# Patient Record
Sex: Male | Born: 1985
Health system: Southern US, Community
[De-identification: ages and names within clinical notes are randomized; demographics above are authoritative.]

## PROBLEM LIST (undated history)

## (undated) DIAGNOSIS — I10 Essential (primary) hypertension: Secondary | ICD-10-CM

## (undated) DIAGNOSIS — T7840XA Allergy, unspecified, initial encounter: Secondary | ICD-10-CM

## (undated) DIAGNOSIS — IMO0002 Reserved for concepts with insufficient information to code with codable children: Secondary | ICD-10-CM

## (undated) DIAGNOSIS — F419 Anxiety disorder, unspecified: Secondary | ICD-10-CM

## (undated) DIAGNOSIS — F1010 Alcohol abuse, uncomplicated: Secondary | ICD-10-CM

## (undated) DIAGNOSIS — M549 Dorsalgia, unspecified: Secondary | ICD-10-CM

## (undated) DIAGNOSIS — K5790 Diverticulosis of intestine, part unspecified, without perforation or abscess without bleeding: Secondary | ICD-10-CM

## (undated) DIAGNOSIS — N200 Calculus of kidney: Secondary | ICD-10-CM

## (undated) HISTORY — DX: Alcohol abuse, uncomplicated: F10.10

## (undated) HISTORY — DX: Diverticulosis of intestine, part unspecified, without perforation or abscess without bleeding: K57.90

## (undated) HISTORY — DX: Calculus of kidney: N20.0

## (undated) HISTORY — DX: Anxiety disorder, unspecified: F41.9

## (undated) HISTORY — DX: Reserved for concepts with insufficient information to code with codable children: IMO0002

## (undated) HISTORY — DX: Allergy, unspecified, initial encounter: T78.40XA

## (undated) HISTORY — DX: Dorsalgia, unspecified: M54.9

## (undated) HISTORY — PX: APPENDECTOMY: SHX54

## (undated) HISTORY — DX: Essential (primary) hypertension: I10

---
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
## 2004-11-23 ENCOUNTER — Ambulatory Visit: Payer: Self-pay | Admitting: Family Medicine

## 2005-11-02 ENCOUNTER — Ambulatory Visit: Payer: Self-pay | Admitting: Family Medicine

## 2005-11-16 ENCOUNTER — Ambulatory Visit: Payer: Self-pay | Admitting: Family Medicine

## 2006-02-01 ENCOUNTER — Ambulatory Visit: Payer: Self-pay | Admitting: Family Medicine

## 2006-02-09 ENCOUNTER — Ambulatory Visit: Payer: Self-pay | Admitting: Family Medicine

## 2006-02-22 ENCOUNTER — Ambulatory Visit: Payer: Self-pay | Admitting: Family Medicine

## 2006-04-25 ENCOUNTER — Ambulatory Visit: Payer: Self-pay | Admitting: Family Medicine

## 2006-05-01 ENCOUNTER — Ambulatory Visit: Payer: Self-pay | Admitting: Family Medicine

## 2006-05-09 ENCOUNTER — Ambulatory Visit: Payer: Self-pay | Admitting: Family Medicine

## 2006-05-12 ENCOUNTER — Ambulatory Visit: Payer: Self-pay | Admitting: Family Medicine

## 2006-05-15 ENCOUNTER — Ambulatory Visit: Payer: Self-pay | Admitting: Family Medicine

## 2006-05-18 ENCOUNTER — Ambulatory Visit: Payer: Self-pay | Admitting: Family Medicine

## 2006-05-23 ENCOUNTER — Ambulatory Visit: Payer: Self-pay | Admitting: Family Medicine

## 2006-05-23 ENCOUNTER — Encounter: Admission: RE | Admit: 2006-05-23 | Discharge: 2006-05-23 | Payer: Self-pay | Admitting: Family Medicine

## 2006-06-06 ENCOUNTER — Ambulatory Visit: Payer: Self-pay | Admitting: Family Medicine

## 2006-06-13 ENCOUNTER — Inpatient Hospital Stay (HOSPITAL_COMMUNITY): Admission: AD | Admit: 2006-06-13 | Discharge: 2006-06-15 | Payer: Self-pay | Admitting: Family Medicine

## 2006-06-13 ENCOUNTER — Ambulatory Visit: Payer: Self-pay | Admitting: Family Medicine

## 2006-06-14 ENCOUNTER — Ambulatory Visit: Payer: Self-pay | Admitting: Internal Medicine

## 2006-06-16 ENCOUNTER — Emergency Department (HOSPITAL_COMMUNITY): Admission: EM | Admit: 2006-06-16 | Discharge: 2006-06-16 | Payer: Self-pay | Admitting: Emergency Medicine

## 2006-06-20 ENCOUNTER — Ambulatory Visit: Payer: Self-pay | Admitting: Gastroenterology

## 2006-07-07 ENCOUNTER — Ambulatory Visit: Payer: Self-pay | Admitting: Family Medicine

## 2006-08-11 ENCOUNTER — Ambulatory Visit: Payer: Self-pay | Admitting: Family Medicine

## 2006-09-19 DIAGNOSIS — J309 Allergic rhinitis, unspecified: Secondary | ICD-10-CM | POA: Insufficient documentation

## 2006-09-19 DIAGNOSIS — I10 Essential (primary) hypertension: Secondary | ICD-10-CM | POA: Insufficient documentation

## 2006-09-19 DIAGNOSIS — J45909 Unspecified asthma, uncomplicated: Secondary | ICD-10-CM | POA: Insufficient documentation

## 2006-09-22 ENCOUNTER — Ambulatory Visit: Payer: Self-pay | Admitting: Family Medicine

## 2006-10-05 ENCOUNTER — Ambulatory Visit: Payer: Self-pay | Admitting: Family Medicine

## 2006-11-06 ENCOUNTER — Ambulatory Visit: Payer: Self-pay | Admitting: Family Medicine

## 2006-11-06 ENCOUNTER — Emergency Department (HOSPITAL_COMMUNITY): Admission: EM | Admit: 2006-11-06 | Discharge: 2006-11-06 | Payer: Self-pay | Admitting: Emergency Medicine

## 2006-11-06 DIAGNOSIS — R1031 Right lower quadrant pain: Secondary | ICD-10-CM | POA: Insufficient documentation

## 2006-11-10 ENCOUNTER — Ambulatory Visit: Payer: Self-pay | Admitting: Family Medicine

## 2007-02-15 ENCOUNTER — Ambulatory Visit: Payer: Self-pay | Admitting: Family Medicine

## 2007-04-16 ENCOUNTER — Ambulatory Visit: Payer: Self-pay | Admitting: Internal Medicine

## 2007-04-16 DIAGNOSIS — J069 Acute upper respiratory infection, unspecified: Secondary | ICD-10-CM | POA: Insufficient documentation

## 2007-04-19 ENCOUNTER — Telehealth: Payer: Self-pay | Admitting: Internal Medicine

## 2007-06-14 ENCOUNTER — Ambulatory Visit: Payer: Self-pay | Admitting: Family Medicine

## 2007-06-15 ENCOUNTER — Ambulatory Visit: Payer: Self-pay | Admitting: Family Medicine

## 2007-06-18 ENCOUNTER — Ambulatory Visit: Payer: Self-pay | Admitting: Family Medicine

## 2007-06-22 ENCOUNTER — Ambulatory Visit: Payer: Self-pay | Admitting: Family Medicine

## 2007-07-05 ENCOUNTER — Telehealth: Payer: Self-pay | Admitting: Family Medicine

## 2007-07-12 ENCOUNTER — Telehealth (INDEPENDENT_AMBULATORY_CARE_PROVIDER_SITE_OTHER): Payer: Self-pay | Admitting: *Deleted

## 2007-07-13 ENCOUNTER — Ambulatory Visit: Payer: Self-pay | Admitting: Family Medicine

## 2007-07-16 ENCOUNTER — Telehealth: Payer: Self-pay | Admitting: Family Medicine

## 2007-07-19 ENCOUNTER — Encounter: Admission: RE | Admit: 2007-07-19 | Discharge: 2007-07-19 | Payer: Self-pay | Admitting: Family Medicine

## 2007-07-23 ENCOUNTER — Ambulatory Visit: Payer: Self-pay | Admitting: Family Medicine

## 2007-07-26 ENCOUNTER — Telehealth (INDEPENDENT_AMBULATORY_CARE_PROVIDER_SITE_OTHER): Payer: Self-pay | Admitting: *Deleted

## 2007-07-31 ENCOUNTER — Telehealth: Payer: Self-pay | Admitting: Family Medicine

## 2007-08-14 ENCOUNTER — Telehealth: Payer: Self-pay | Admitting: Family Medicine

## 2007-08-15 ENCOUNTER — Telehealth: Payer: Self-pay | Admitting: Family Medicine

## 2007-12-29 ENCOUNTER — Ambulatory Visit: Payer: Self-pay | Admitting: Family Medicine

## 2008-10-30 ENCOUNTER — Ambulatory Visit: Payer: Self-pay | Admitting: Family Medicine

## 2008-12-25 ENCOUNTER — Ambulatory Visit: Payer: Self-pay | Admitting: Family Medicine

## 2009-06-01 ENCOUNTER — Encounter (INDEPENDENT_AMBULATORY_CARE_PROVIDER_SITE_OTHER): Payer: Self-pay | Admitting: General Surgery

## 2009-06-01 ENCOUNTER — Observation Stay (HOSPITAL_COMMUNITY): Admission: EM | Admit: 2009-06-01 | Discharge: 2009-06-02 | Payer: Self-pay | Admitting: Emergency Medicine

## 2009-06-01 ENCOUNTER — Ambulatory Visit: Payer: Self-pay | Admitting: Family Medicine

## 2009-07-06 ENCOUNTER — Ambulatory Visit: Payer: Self-pay | Admitting: Family Medicine

## 2009-08-18 ENCOUNTER — Ambulatory Visit: Payer: Self-pay | Admitting: Family Medicine

## 2009-08-20 ENCOUNTER — Ambulatory Visit: Payer: Self-pay | Admitting: Family Medicine

## 2009-08-20 DIAGNOSIS — F410 Panic disorder [episodic paroxysmal anxiety] without agoraphobia: Secondary | ICD-10-CM | POA: Insufficient documentation

## 2009-08-21 ENCOUNTER — Ambulatory Visit: Payer: Self-pay | Admitting: Family Medicine

## 2009-09-09 ENCOUNTER — Ambulatory Visit: Payer: Self-pay | Admitting: Family Medicine

## 2009-09-09 DIAGNOSIS — B360 Pityriasis versicolor: Secondary | ICD-10-CM | POA: Insufficient documentation

## 2009-09-09 LAB — CONVERTED CEMR LAB
Cholesterol, target level: 200 mg/dL
HDL goal, serum: 40 mg/dL
LDL Goal: 130 mg/dL

## 2009-12-02 ENCOUNTER — Telehealth: Payer: Self-pay | Admitting: Family Medicine

## 2009-12-03 ENCOUNTER — Telehealth: Payer: Self-pay | Admitting: Family Medicine

## 2009-12-14 ENCOUNTER — Ambulatory Visit: Payer: Self-pay | Admitting: Family Medicine

## 2009-12-14 LAB — CONVERTED CEMR LAB
ALT: 34 U/L (ref 0–53)
AST: 30 U/L (ref 0–37)
Albumin: 4.4 g/dL (ref 3.5–5.2)
Alkaline Phosphatase: 101 U/L (ref 39–117)
BUN: 17 mg/dL (ref 6–23)
Basophils Absolute: 0.1 K/uL (ref 0.0–0.1)
Basophils Relative: 0.9 % (ref 0.0–3.0)
Bilirubin Urine: NEGATIVE
Bilirubin, Direct: 0.1 mg/dL (ref 0.0–0.3)
CO2: 22 meq/L (ref 19–32)
Calcium: 9.2 mg/dL (ref 8.4–10.5)
Chloride: 104 meq/L (ref 96–112)
Cholesterol: 204 mg/dL — ABNORMAL HIGH (ref 0–200)
Creatinine, Ser: 0.9 mg/dL (ref 0.4–1.5)
Direct LDL: 122.5 mg/dL
Eosinophils Absolute: 0 K/uL (ref 0.0–0.7)
Eosinophils Relative: 0 % (ref 0.0–5.0)
GFR calc non Af Amer: 113.96 mL/min (ref >60)
Glucose, Bld: 82 mg/dL (ref 70–99)
HCT: 44.2 % (ref 39.0–52.0)
HDL: 35.8 mg/dL — ABNORMAL LOW (ref >39.00)
Hemoglobin, Urine: NEGATIVE
Hemoglobin: 15.5 g/dL (ref 13.0–17.0)
Ketones, ur: NEGATIVE mg/dL
Leukocytes, UA: NEGATIVE
Lymphocytes Relative: 31.1 % (ref 12.0–46.0)
Lymphs Abs: 2.3 K/uL (ref 0.7–4.0)
MCHC: 35.1 g/dL (ref 30.0–36.0)
MCV: 93.3 fL (ref 78.0–100.0)
Monocytes Absolute: 0.6 K/uL (ref 0.1–1.0)
Monocytes Relative: 8.7 % (ref 3.0–12.0)
Neutro Abs: 4.4 K/uL (ref 1.4–7.7)
Neutrophils Relative %: 59.3 % (ref 43.0–77.0)
Nitrite: NEGATIVE
Platelets: 260 K/uL (ref 150.0–400.0)
Potassium: 4.5 meq/L (ref 3.5–5.1)
RBC: 4.74 M/uL (ref 4.22–5.81)
RDW: 12.9 % (ref 11.5–14.6)
Sodium: 136 meq/L (ref 135–145)
Specific Gravity, Urine: 1.025 (ref 1.000–1.030)
TSH: 0.77 u[IU]/mL (ref 0.35–5.50)
Total Bilirubin: 0.5 mg/dL (ref 0.3–1.2)
Total CHOL/HDL Ratio: 6
Total Protein, Urine: NEGATIVE mg/dL
Total Protein: 7.2 g/dL (ref 6.0–8.3)
Triglycerides: 268 mg/dL — ABNORMAL HIGH (ref 0.0–149.0)
Urine Glucose: NEGATIVE mg/dL
Urobilinogen, UA: 0.2 (ref 0.0–1.0)
VLDL: 53.6 mg/dL — ABNORMAL HIGH (ref 0.0–40.0)
WBC: 7.4 10*3/microliter (ref 4.5–10.5)
pH: 6 (ref 5.0–8.0)

## 2009-12-21 ENCOUNTER — Ambulatory Visit: Payer: Self-pay | Admitting: Family Medicine

## 2010-01-18 ENCOUNTER — Ambulatory Visit: Payer: Self-pay | Admitting: Family Medicine
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
## 2010-04-27 NOTE — Assessment & Plan Note (Signed)
Summary: RASH ON BOTH ARMS/CJR   Vital Signs:  Patient profile:   25 year old male Height:      72 inches Weight:      212 pounds BMI:     28.86 Temp:     98.0 degrees F oral Pulse rate:   118 / minute BP sitting:   160 / 80  (left arm) Cuff size:   regular  Vitals Entered By: Kern Reap CMA Duncan Dull) (Aug 18, 2009 3:36 PM) CC: rash on arms   CC:  rash on arms.  History of Present Illness: Ryan [name] is a 25 year old male, who comes in today for evaluation of a skin rash.  He had a chronic rash on both upper arms.  It comes and goes.  Worse in the winter  Allergies: 1)  ! Penicillin  Review of Systems      See HPI  Physical Exam  General:  Well-developed,well-nourished,in no acute distress; alert,appropriate and cooperative throughout examination Skin:  the skin rash, consistent with picullaris    Problems:  Medical Problems Added: 1)  Dx of Rash-nonvesicular  (ICD-782.1)  Impression & Recommendations:  Problem # 1:  RASH-NONVESICULAR (ICD-782.1) Assessment New  His updated medication list for this problem includes:    Triamcinolone Acetonide 0.5 % Oint (Triamcinolone acetonide) .Marland Kitchen... Apply at bedtime ,,,,small amts !!!!!!!  Orders: Prescription Created Electronically [phone])  Complete Medication List: 1)  Cozaar 50 Mg Tabs (Losartan potassium) .... Take 1 tablet by mouth every morning 2)  Prednisone 20 Mg Tabs (Prednisone) .... Uad 3)  Triamcinolone Acetonide 0.5 % Oint (Triamcinolone acetonide) .... Apply at bedtime ,,,,small amts !!!!!!!  Patient Instructions: 1)  apply udder crm/and small amounts of triamcinolone ointment nightly p.r.n. Prescriptions: TRIAMCINOLONE ACETONIDE 0.5 % OINT (TRIAMCINOLONE ACETONIDE) apply at bedtime ,,,,small amts !!!!!!!  #60 gr x 3   Entered and Authorized by:   Roderick Pee MD   Signed by:   Roderick Pee MD on 08/18/2009   Method used:   [facility] to        [facility] [address] [phone]* (retail)       [address]       [place], [place]  [address]       Ph: [phone] or [phone]       Fax: [phone]   RxID:   [id number]

## 2010-04-27 NOTE — Assessment & Plan Note (Signed)
Summary: flu shot/jls  Nurse Visit  Flu Vaccine Consent Questions     Do you have a history of severe allergic reactions to this vaccine? no    Any prior history of allergic reactions to egg and/or gelatin? no    Do you have a sensitivity to the preservative Thimersol? no    Do you have a past history of Guillan-Barre Syndrome? no    Do you currently have an acute febrile illness? no    Have you ever had a severe reaction to latex? no    Vaccine information given and explained to patient? yes    Are you currently pregnant? no    Lot Number:[id number]   Site Given  Left Deltoid IM   Prior Medications: BENICAR 40 MG  TABS (OLMESARTAN MEDOXOMIL) Take 1 tablet by mouth once a day FLEXERIL 10 MG  TABS (CYCLOBENZAPRINE HCL) Take 1 tablet by mouth three times a day VICODIN ES 7.5-750 MG  TABS (HYDROCODONE-ACETAMINOPHEN) 1 or 2 tabs three times a day as needed pain HYCODAN 5-1.5 MG/5ML  SYRP (HYDROCODONE-HOMATROPINE) 1 or 2 tsps three times a day as needed Current Allergies: ! PENICILLIN    Orders Added: 1)  Admin 1st Vaccine [90471] 2)  Flu Vaccine 75yrs + [place].Dew    ]

## 2010-04-27 NOTE — Assessment & Plan Note (Signed)
Summary: FLU SHOT/CJR  Nurse Visit  Flu Vaccine Consent Questions     Do you have a history of severe allergic reactions to this vaccine? no    Any prior history of allergic reactions to egg and/or gelatin? no    Do you have a sensitivity to the preservative Thimersol? no    Do you have a past history of Guillan-Barre Syndrome? no    Do you currently have an acute febrile illness? no    Have you ever had a severe reaction to latex? no    Vaccine information given and explained to patient? yes    Are you currently pregnant? no    Lot Number:[id number]   Exp Date:09/24/2009   Site Given  Left Deltoid IM Romualdo Bolk, CMA (AAMA)  December 25, 2008 3:10 PM  Allergies: 1)  ! Penicillin  Orders Added: 1)  Admin 1st Vaccine [90471] 2)  Flu Vaccine 15yrs + [28413]

## 2010-04-27 NOTE — Progress Notes (Signed)
Summary: refill  Phone Note Call from Patient Call back at Home Phone [phone]   Caller: pt live Call For: Tawanna Cooler Summary of Call: vicodan 7.5 mg [facility] please call patient when you call in to drug store .  Is this okay to fill? Initial call taken by: Roselle Locus,  Aug 14, 2007 12:36 PM

## 2010-04-27 NOTE — Progress Notes (Signed)
Summary: problem with med, changed to Vicodin  Phone Note Call from Patient Call back at [phone]   Caller: patient live Call For: todd Summary of Call: patient is taking percaset it is making him dizzy and can't sleep.   Please call something else in.  [facility] Initial call taken by: Celine Ahr,  July 16, 2007 3:08 PM  Follow-up for Phone Call        please call N. Vicodin ES number 50 directions one tablet every 4 to 6 hours p.r.n. for severe pain, [facility]ills x [facility] Follow-up by: Roderick Pee MD,  July 17, 2007 8:47 AM  Additional Follow-up for Phone Call Additional follow up Details #1::        Rx called in, pt informed.  Pt MRI of his back is scheduled for Thursday 2/23.  Scheduled pt for F/U with Dr Tawanna Cooler on Monday 4/27. Pt informed of new no-show policy. Additional Follow-up by: Sid Falcon LPN,  July 17, 2007 9:33 AM

## 2010-04-27 NOTE — Progress Notes (Signed)
Summary: about med  Phone Note Call from Patient Call back at [phone]   Caller: patient mother Call For: todd Summary of Call: Aneta Mins did get a refill on 5-5 for hydrocodone pt is out of med.  I would like you to call  me to talk about this problem. Initial call taken by: Celine Ahr,  Aug 15, 2007 11:23 AM  Follow-up for Phone Call        discussed  w MOM She is concerned he is abusing  Vicodin.  Therefore, we will give him 21 tablets to be picked up by the mother at the pharmacy.  He can take one at bedtime as needed for pain.  No more.  He is to see the folks at the pain clinic in two weeks and begin that process. Follow-up by: Roderick Pee MD,  Aug 15, 2007 11:56 AM

## 2010-04-27 NOTE — Progress Notes (Signed)
Summary: rx refill  Phone Note Call from Patient Call back at [phone]   Caller: patient live Call For: todd Summary of Call: Patient needs rx refill for vicaden  7.5mg  2 3 times aday.  [facility].  Please call  Initial call taken by: Celine Ahr,  July 12, 2007 2:33 PM  Follow-up for Phone Call        t 30 tablets no refills.  Patient needs an office visit tomorrow forevaluation Follow-up by: Roderick Pee MD,  July 12, 2007 4:17 PM  Additional Follow-up for Phone Call Additional follow up Details #1::        left message on machine patient needs appt per Dr. Tawanna Cooler tomorrow scheduled appt for patient to be seen at 11:00 ask him to call if he can't make appt and prescription sent to pharmacy ..................................................................Marland KitchenDoristine Devoid  July 12, 2007 4:25 PM       Prescriptions: VICODIN ES 7.5-750 MG  TABS (HYDROCODONE-ACETAMINOPHEN) 1 or 2 tabs three times a day as needed pain  #30 x 0   Entered by:   Doristine Devoid   Authorized by:   Roderick Pee MD   Signed by:   Doristine Devoid on 07/12/2007   Method used:   Telephoned to ...       [facility] [address] [phone]*       [address]       [place], [place]  [address]       Ph: [phone] or [phone]       Fax: [phone]   RxID:   [id number]

## 2010-04-27 NOTE — Progress Notes (Signed)
Summary: REFILL REQUEST (PT HAS APPT ON 9/12)  Phone Note Refill Request   Refills Requested: Medication #1:  COZAAR 50 MG TABS Take 1 tablet by mouth every morning   [facility] [address] Pt would like enough of med sent in to do him till his appt  on 9/12.    Initial call taken by: Debbra Riding,  December 02, 2009 9:59 AM    Prescriptions: COZAAR 50 MG TABS (LOSARTAN POTASSIUM) Take 1 tablet by mouth every morning  #30 x 0   Entered by:   Kern Reap CMA (AAMA)   Authorized by:   Roderick Pee MD   Signed by:   Kern Reap CMA (AAMA) on 12/02/2009   Method used:   Electronically to        [facility]  [facility] [address] [phone]* (retail)       [address]       [place], [place]  [address]       Ph: [phone] or [phone]       Fax: [phone]   RxID:   [id number]

## 2010-04-27 NOTE — Assessment & Plan Note (Signed)
Summary: 3 MONTH ROA/JLS    History of Present Illness: still comes back today for follow-up of back pain.  He was seen on Monday of this week with a 12 hour history of severe right flank pain.  His pain at that juncture was on a scale of one to 10 and 9.  He was given hundred of Demerol IM and his mother took in the emergency room with a presumptive diagnosis of a right kidney stone.  CT scan year and lab work were all normal.  He was seen in consultation by general surgeon, the ninth in any acute surgical problems.  He went home and is doing okay.  Most of his pain is gone.  He still has some discomfort in his groin, which he describes as a sensation of a bruise.  He's had no new symptoms and meantime. there is a strong family history of kidney stones run in the family.  Acute Visit History:      He denies abdominal pain, chest pain, constipation, cough, diarrhea, earache, eye symptoms, fever, genitourinary symptoms, headache, musculoskeletal symptoms, nasal discharge, nausea, rash, sinus problems, sore throat, and vomiting.         Current Allergies: ! PENICILLIN     Review of Systems      See HPI   Physical Exam  General:     Well-developed,well-nourished,in no acute distress; alert,appropriate and cooperative throughout examination Abdomen:     Bowel sounds positive,abdomen soft and non-tender without masses, organomegaly or hernias noted.    Impression & Recommendations:  Problem # 1:  SYMPTOM, PAIN, ABDOMINAL, RIGHT LWR QUADRANT (ICD-789.03) Assessment: Improved  Complete Medication List: 1)  Benicar 40 Mg Tabs (Olmesartan medoxomil) .... Take 1 tablet by mouth once a day   Patient Instructions: 1)  your examination, today, is normal.  We also rechecked her urine.  It was clear.  Not sure why her having this ongoing problem.  If over the next couple days if symptoms stop them we would do nothing further.  If they recur, then I want you to see one of the urologists for  consultation
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
## 2010-04-27 NOTE — Progress Notes (Signed)
Summary: hydrocodone refill request  Phone Note Refill Request Message from:  Fax from Pharmacy on Jul 31, 2007 4:44 PM  Refills Requested: Medication #1:  VICODIN ES 7.5-750 MG  TABS 1 or 2 tabs three times a day as needed pain   Last Refilled: 07/25/2007  [facility][phone]  Initial call taken by: Doristine Devoid,  Jul 31, 2007 4:46 PM  Follow-up for Phone Call        is refill ok?......Marland KitchenDoristine Devoid  Jul 31, 2007 4:46 PM   Additional Follow-up for Phone Call Additional follow up Details #1::        okay to refill Vicodin ES number 50, 1 p.o. b.i.d., one refill Additional Follow-up by: Roderick Pee MD,  Aug 01, 2007 1:00 PM    Additional Follow-up for Phone Call Additional follow up Details #2::    please call pt when done at [phone] Follow-up by: Warnell Forester,  Aug 01, 2007 2:24 PM  Additional Follow-up for Phone Call Additional follow up Details #3:: Details for Additional Follow-up Action Taken: Rx called in, documented in EMR,  pt informed Additional Follow-up by: Sid Falcon LPN,  Jul 31, 1608 3:22 PM    Prescriptions: VICODIN ES 7.5-750 MG  TABS (HYDROCODONE-ACETAMINOPHEN) 1 or 2 tabs three times a day as needed pain  #50 x 1   Entered by:   Sid Falcon LPN   Authorized by:   Roderick Pee MD   Signed by:   Sid Falcon LPN on 96/06/5407   Method used:   Telephoned to ...       [facility]  [facility] [address] [phone]*       [address]       [place], [place]  [address]       Ph: [phone] or [phone]       Fax: [phone]   RxID:   [id number]

## 2010-04-27 NOTE — Progress Notes (Signed)
Summary: REFILL  Phone Note Call from Patient Call back at Home Phone [phone]   Caller: PATIENT TRIAGE MESSAGE Call For: k Summary of Call: wOULD LIKE REFILL ON THE COUGH MEDICINE HE GOT ON  MONDAY [facility] Initial call taken by: Roselle Locus,  April 19, 2007 2:18 PM  Follow-up for Phone Call        Generic hycodan  6 oz one or two twp evry 6 hours as needed per Dr.  Kirtland Bouchard  [facility]) ..................................................................Marland KitchenLynann Beaver [facility]  April 19, 2007 5:21 PM Follow-up by: Lynann Beaver CMA,  April 19, 2007 5:21 PM    New/Updated Medications: HYCODAN 5-1.5 MG/5ML  SYRP (HYDROCODONE-HOMATROPINE) one - two tsp q 6 hrs as needed for cough.   Prescriptions: HYCODAN 5-1.5 MG/5ML  SYRP (HYDROCODONE-HOMATROPINE) one - two tsp q 6 hrs as needed for cough.  #6 oz x 0   Entered by:   Lynann Beaver CMA   Authorized by:   Alita Chyle Triage Nurse   Signed by:   Lynann Beaver CMA on 04/19/2007   Method used:   Print then Give to Patient   RxID:   [id number]   authorized by Dr. Kirtland Bouchard and called to pharmacy.......[facility]) ..................................................................Marland KitchenLynann Beaver CMA  April 19, 2007 5:22 PM
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
## 2010-04-27 NOTE — Progress Notes (Signed)
Summary: pt called to update med info from your call   Phone Note Call from Patient Call back at Home Phone [phone]   Caller: pt live Call For: Tawanna Cooler Summary of Call: The Benicar was not refilled and the hydrocodone was an 8 day supply he waas taking 2 three times a day  Initial call taken by: Roselle Locus,  Aug 15, 2007 9:01 AM
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
## 2010-04-27 NOTE — Assessment & Plan Note (Signed)
Summary: med refill/cjr   Vital Signs:  Patient profile:   25 year old male Height:      72 inches Weight:      236 pounds BMI:     32.12 Temp:     97.5 degrees F oral BP sitting:   120 / 80  (left arm) Cuff size:   regular  Vitals Entered By: Kern Reap CMA Duncan Dull) (October 30, 2008 3:21 PM)  Reason for Visit follow up meds  History of Present Illness: Rhylee is a 25 year old male, who comes in today for evaluation of hypertension.  He spent on Benicar 40 mg daily.  BP 120/80.  He is intolerant of ACE inhibitors.  His insurance will no longer cover Benicar.  They want to pick an alternative drug.  Will switch to Cozaar 50 mg daily with careful blood pressure monitoring.  Over the next 4 weeks to be sure his blood pressure stays normal  Allergies: 1)  ! Penicillin  Social History: Reviewed history from 11/06/2006 and no changes required. Current Smoker Alcohol use-no Drug use-yes Regular exercise-no  Review of Systems      See HPI  Physical Exam  General:  Well-developed,well-nourished,in no acute distress; alert,appropriate and cooperative throughout examination   Impression & Recommendations:  Problem # 1:  HYPERTENSION (ICD-401.9) Assessment Improved  His updated medication list for this problem includes:    Benicar 40 Mg Tabs (Olmesartan medoxomil) .Marland Kitchen... Take 1 tablet by mouth once a day    Cozaar 50 Mg Tabs (Losartan potassium) .Marland Kitchen... Take 1 tablet by mouth every morning  Orders: Prescription Created Electronically [phone])  Complete Medication List: 1)  Benicar 40 Mg Tabs (Olmesartan medoxomil) .... Take 1 tablet by mouth once a day 2)  Cozaar 50 Mg Tabs (Losartan potassium) .... Take 1 tablet by mouth every morning  Patient Instructions: 1)  stopped the Benicar begin Cozaar 50 mg daily.  Check your blood pressure daily for the next 4 weeks to be sure her pressure stays normal.  If after 4 weeks.  Her blood pressure is normal then, just checking  weekly.  If her blood pressure does go up bring all the data and return to see me Prescriptions: COZAAR 50 MG TABS (LOSARTAN POTASSIUM) Take 1 tablet by mouth every morning  #100 x 3   Entered and Authorized by:   Roderick Pee MD   Signed by:   Roderick Pee MD on 10/30/2008   Method used:   [facility]        [facility] [address] [phone]* (retail)       [address]       [place], [place]  [address]       Ph: [phone] or [phone]       Fax: [phone]   RxID:   [id number]

## 2010-04-27 NOTE — Progress Notes (Signed)
Summary: pt called and said he would come tomorrow at 11   Phone Note Call from Patient   Caller: pt live Call For: Tawanna Cooler Summary of Call: pt called advised him you want him to come in tomorrow at 11:00.  Said he would  Initial call taken by: Roselle Locus,  July 12, 2007 4:32 PM  Follow-up for Phone Call        noted ..................................................................Marland KitchenDoristine Devoid  July 12, 2007 4:48 PM
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
## 2010-04-27 NOTE — Progress Notes (Signed)
Summary: referral  Phone Note Call from Patient   Caller: Patient Call For: Roderick Pee MD Summary of Call: Pt wants a referral to a pyschiatrist for alcohol abuse. [phone] Initial call taken by: Lynann Beaver CMA,  December 03, 2009 4:50 PM  Follow-up for Phone Call        have patient call Dr. Rolly Pancake, Nolen Mu, and asked them to their recommendations Follow-up by: Roderick Pee MD,  December 03, 2009 4:53 PM  Additional Follow-up for Phone Call Additional follow up Details #1::        Phone Call Completed Additional Follow-up by: Rudy Jew, RN,  December 04, 2009 8:32 AM

## 2010-04-27 NOTE — Progress Notes (Signed)
Summary: scheduled for steriod injections in June  Phone Note Call from Patient   Caller: pt live Call For: Tawanna Cooler Summary of Call: Saw neurosurgeon Dr Wynetta Emery and he has scheduled him for steriod injections in june.  Initial call taken by: Roselle Locus,  Aug 14, 2007 12:35 PM

## 2010-04-27 NOTE — Assessment & Plan Note (Signed)
Summary: acute/aw   Vital Signs:  Patient Profile:   25 Years Old Male Height:     72 inches Weight:      210 pounds Temp:     97.7 degrees F oral BP sitting:   120 / 80  (right arm)  Pt. in pain?   yes    Location:   pelvic/abd R    Intensity:   10    Type:       extreme  Vitals Entered By: Arcola Jansky, RN (November 06, 2006 1:10 PM)              Is Patient Diabetic? No   Chief Complaint:  "pain in( RLQ)abd/pelvic;went to bathroom passed out, hit head, and N&V x3".  History of Present Illness: Ryan [name] comes in today for evaluation of acute back and flank pain.  He says he was well until 11 p.m. last night when he developed the sudden onset of severe back and flank pain.  It vomiting x 2.  He's otherwise been well.  He said no diarrhea, fever, chills, etc. there is a positive family history kidney stones in his family.  GI review of systems otherwise negative.  He says is pain in his right scale of one to 10 is an 8.  Acute Visit History:      He denies abdominal pain, chest pain, constipation, cough, diarrhea, earache, eye symptoms, fever, genitourinary symptoms, headache, musculoskeletal symptoms, nasal discharge, nausea, rash, sinus problems, sore throat, and vomiting.         Current Allergies: ! PENICILLIN    Risk Factors:  Tobacco use:  current    Cigarettes:  Yes Alcohol use:  yes    Drinks per day:  1   Review of Systems      See HPI   Physical Exam  General:     Well-developed,well-nourished,in no acute distress; alert,appropriate and cooperative throughout examination Head:     Normocephalic and atraumatic without obvious abnormalities. No apparent alopecia or balding. Eyes:     No corneal or conjunctival inflammation noted. EOMI. Perrla. Funduscopic exam benign, without hemorrhages, exudates or papilledema. Vision grossly normal. Ears:     External ear exam shows no significant lesions or deformities.  Otoscopic examination reveals clear  canals, tympanic membranes are intact bilaterally without bulging, retraction, inflammation or discharge. Hearing is grossly normal bilaterally. Nose:     External nasal examination shows no deformity or inflammation. Nasal mucosa are pink and moist without lesions or exudates. Mouth:     Oral mucosa and oropharynx without lesions or exudates.  Teeth in good repair. Abdomen:     Bowel sounds positive,abdomen soft and non-tender without masses, organomegaly or hernias noted.    Impression & Recommendations:  Problem # 1:  SYMPTOM, PAIN, ABDOMINAL, RIGHT LWR QUADRANT (ICD-789.03) Assessment: New  Complete Medication List: 1)  Benicar 40 Mg Tabs (Olmesartan medoxomil) .... Take 1 tablet by mouth once a day  Other Orders: Demerol / Phenergan Injection (Z6109) Admin of Therapeutic Inj  intramuscular or subcutaneous (60454)   Patient Instructions: 1)  because of the severe abdominal pain with them and giving 50 mg of Demerol and 25 mg of Phenergan.  Then, will have your mom, taken to the emergency room.  In the emergency room he was seen CBCS.  He met a year and a CT scan of his abdomen and pelvis.  These were all normal.  We therefore asked the general surgeon, Dr. Vernell Morgans post to see him for  evaluation.         Medication Administration  Injection # 1:    Medication: Demerol / Phenergan Injection    Diagnosis: CALCULUS, URINARY NOS (ICD-592.9)    Route: IM    Site: LUOQ gluteus    Exp Date: 02/26/2008    Lot #: [id number]    Mfr: hospira    Comments: 50mg  demorol mixed c 25mg  of phenerghan    Patient tolerated injection without complications    Given by: Arcola Jansky, RN (November 06, 2006 1:54 PM)  Orders Added: 1)  Demerol / Phenergan Injection [J2180] 2)  Admin of Therapeutic Inj  intramuscular or subcutaneous [90772] 3)  Est. Patient Level IV [62703]

## 2010-04-27 NOTE — Assessment & Plan Note (Signed)
Summary: ? flu/ccm   Vital Signs:  Patient Profile:   25 Years Old Male Height:     72 inches Weight:      208 pounds Temp:     97.5 degrees F oral BP sitting:   130 / 88  (left arm)  Vitals Entered By: Raechel Ache, RN (April 16, 2007 3:50 PM)                 Chief Complaint:  C/o some head congestion and sore throat and productive cough x 2 wks. Taking Dayquil.Marland Kitchen  History of Present Illness: 25 year old gentleman, history of hypertension, who presents with a several-day history of head and chest congestion, sore throat, and productive cough  Current Allergies: ! PENICILLIN      Physical Exam  General:     Well-developed,well-nourished,in no acute distress; alert,appropriate and cooperative throughout examination Head:     Normocephalic and atraumatic without obvious abnormalities. No apparent alopecia or balding. Eyes:     No corneal or conjunctival inflammation noted. EOMI. Perrla. Funduscopic exam benign, without hemorrhages, exudates or papilledema. Vision grossly normal. Ears:     External ear exam shows no significant lesions or deformities.  Otoscopic examination reveals clear canals, tympanic membranes are intact bilaterally without bulging, retraction, inflammation or discharge. Hearing is grossly normal bilaterally. Mouth:     oropharynx injected Neck:     supple no adenopathy Chest Wall:     No deformities, masses, tenderness or gynecomastia noted. Lungs:     Normal respiratory effort, chest expands symmetrically. Lungs are clear to auscultation, no crackles or wheezes. Heart:     Normal rate and regular rhythm. S1 and S2 normal without gallop, murmur, click, rub or other extra sounds.    Impression & Recommendations:  Problem # 1:  URI (ICD-465.9)  His updated medication list for this problem includes:    Hydrocodone-homatropine 5-1.5 Mg/81ml Syrp (Hydrocodone-homatropine) .Marland Kitchen..Marland Kitchen Two tsp every 6 hours as needed   Problem # 2:  HYPERTENSION  (ICD-401.9)  His updated medication list for this problem includes:    Benicar 40 Mg Tabs (Olmesartan medoxomil) .Marland Kitchen... Take 1 tablet by mouth once a day   Complete Medication List: 1)  Benicar 40 Mg Tabs (Olmesartan medoxomil) .... Take 1 tablet by mouth once a day 2)  Hydrocodone-homatropine 5-1.5 Mg/61ml Syrp (Hydrocodone-homatropine) .... Two tsp every 6 hours as needed   Patient Instructions: 1)  Please schedule a follow-up appointment in 3 months. 2)  Acute bronchitis symptoms for less than 10 days are not helped by antibiotics. take over the counter cough medications. call if no improvment in  5-7 days, sooner if increasing cough, fever, or new symptoms( shortness of breath, chest pain).    Prescriptions: HYDROCODONE-HOMATROPINE 5-1.5 MG/5ML  SYRP (HYDROCODONE-HOMATROPINE) two tsp every 6 hours as needed  #6 oz x 0   Entered and Authorized by:   Gordy Savers  MD   Signed by:   Gordy Savers  MD on 04/16/2007   Method used:   Print then Give to Patient   RxID:   [id number]  ]

## 2010-04-27 NOTE — Assessment & Plan Note (Signed)
Summary: flu shot/angela/njr  Nurse Visit   Impression & Recommendations:  Problem # 1:  Preventive Health Care (ICD-V70.0) lot [id number], EXP 30 jun 09, sanofi pasteur left deltoid IM, 0.5 cc.   Complete Medication List: 1)  Benicar 40 Mg Tabs (Olmesartan medoxomil) .... Take 1 tablet by mouth once a day  Other Orders: Flu Vaccine 36yrs + (16109) Admin 1st Vaccine (60454)    Prior Medications: BENICAR 40 MG  TABS (OLMESARTAN MEDOXOMIL) Take 1 tablet by mouth once a day Current Allergies: ! PENICILLIN   Influenza Vaccine    Vaccine Type: Fluvax 3+    Given by: Arcola Jansky, RN  Flu Vaccine Consent Questions    Do you have a history of severe allergic reactions to this vaccine? no    Any prior history of allergic reactions to egg and/or gelatin? no    Do you have a sensitivity to the preservative Thimersol? no    Do you have a past history of Guillan-Barre Syndrome? no    Do you currently have an acute febrile illness? no    Have you ever had a severe reaction to latex? no    Vaccine information given and explained to patient? yes   Orders Added: 1)  Flu Vaccine 26yrs + [90658] 2)  Admin 1st Vaccine [email or web address]    ]

## 2010-05-03 ENCOUNTER — Ambulatory Visit: Payer: Self-pay | Admitting: Family Medicine

## 2010-05-03 ENCOUNTER — Ambulatory Visit (INDEPENDENT_AMBULATORY_CARE_PROVIDER_SITE_OTHER): Payer: BC Managed Care – PPO | Admitting: Family Medicine

## 2010-05-03 ENCOUNTER — Encounter: Payer: Self-pay | Admitting: Family Medicine

## 2010-05-03 VITALS — BP 140/90 | Temp 98.4°F | Ht 72.0 in | Wt 203.0 lb

## 2010-05-03 DIAGNOSIS — Z0000 Encounter for general adult medical examination without abnormal findings: Secondary | ICD-10-CM

## 2010-05-03 DIAGNOSIS — J45909 Unspecified asthma, uncomplicated: Secondary | ICD-10-CM

## 2010-05-03 MED ORDER — PREDNISONE 20 MG PO TABS: ORAL_TABLET | ORAL | Status: DC

## 2010-05-03 MED ORDER — HYDROCODONE-HOMATROPINE 5-1.5 MG/5ML PO SYRP: 120.0000 mL | ORAL_SOLUTION | Freq: Four times a day (QID) | ORAL | Status: AC | PRN

## 2010-05-03 MED ORDER — HPV QUADRIVALENT VACCINE IM SUSP
0.5000 mL | Freq: Once | INTRAMUSCULAR | Status: AC
  Administered 2010-05-03: 0.5 mL via INTRAMUSCULAR

## 2010-05-03 NOTE — Progress Notes (Signed)
  Subjective:    Patient ID: Ryan [name], male    DOB: [birth year], 25 y.o.   MRN: [id number]  HPI Ryan [name] is a 25 year old single male, who comes in today with day 8 day history of a cough.  He said a history of allergic rhinitis and asthma in the past.  He is now smoking.  The electronic cigarettes a half a pack a day.  He developed this cough about 89 days ago.  No fever, earache, chills, et Karie Soda.  No sputum production.  He is intolerant of the chantix it made him severely depressed.  Review of Systems    Negative Objective:   Physical Exam    In general, he is a well-developed, well-nourished, male in no acute distress.  Examination of the HEENT were negative.  Neck was supple.  No adenopathy.  Lungs show symmetrical.  Breath sounds with late expiratory wheezing.  No crackles    Assessment & Plan:  Viral syndrome.  Asthma.  Plan no smoking, 30 ounces of water daily, Hydromet one half to 1 teaspoon 3 times a day as needed for cough.  Prednisone two tabs x 3 days, one x 3 days, a half x 3 days, then half a tablet Monday, Wednesday, Friday, for a two week taper.  Return p.r.n.

## 2010-05-03 NOTE — Patient Instructions (Signed)
Drank 30 ounces of water daily.  No smoking.  Hydromet one half to 1 teaspoon 3 times a day as needed.  Prednisone two tabs x 3 days, one x 3 days, a half x 3 days, then half a tablet Monday, Wednesday, Friday, for a two-week taper.  Return as needed

## 2010-05-17 ENCOUNTER — Telehealth: Payer: Self-pay | Admitting: Family Medicine

## 2010-05-17 NOTE — Telephone Encounter (Signed)
Office visit Tuesday

## 2010-05-17 NOTE — Telephone Encounter (Signed)
Pt is no better still having cough and uri symptoms. [facility] [phone]. Pt unable to come in today due to work

## 2010-05-18 ENCOUNTER — Ambulatory Visit (INDEPENDENT_AMBULATORY_CARE_PROVIDER_SITE_OTHER): Payer: BC Managed Care – PPO | Admitting: Family Medicine

## 2010-05-18 ENCOUNTER — Encounter: Payer: Self-pay | Admitting: Family Medicine

## 2010-05-18 ENCOUNTER — Telehealth: Payer: Self-pay | Admitting: *Deleted

## 2010-05-18 DIAGNOSIS — J45909 Unspecified asthma, uncomplicated: Secondary | ICD-10-CM

## 2010-05-18 DIAGNOSIS — F17200 Nicotine dependence, unspecified, uncomplicated: Secondary | ICD-10-CM

## 2010-05-18 DIAGNOSIS — J069 Acute upper respiratory infection, unspecified: Secondary | ICD-10-CM

## 2010-05-18 MED ORDER — HYDROCODONE-HOMATROPINE 5-1.5 MG/5ML PO SYRP: 5.0000 mL | ORAL_SOLUTION | Freq: Four times a day (QID) | ORAL | Status: DC | PRN

## 2010-05-18 MED ORDER — DOXYCYCLINE HYCLATE 100 MG PO TABS: 100.0000 mg | ORAL_TABLET | Freq: Two times a day (BID) | ORAL | Status: AC

## 2010-05-18 NOTE — Patient Instructions (Signed)
Drink lots of water.  Hydromet one half to 1 teaspoon up to 3 times a day as needed.  Stop smoking completely.  Chantix one half tablet daily in the morning.  Restart the prednisone two tabs x 3 days , a tablet a half x 3 days, one x 3 days, a half x 3 days, then half a tablet Monday, Wednesday, Friday, for a two week taper.  Doxycycline 100 mg b.i.d. Two bottle empty.  Return in 10 days for follow

## 2010-05-18 NOTE — Telephone Encounter (Signed)
[facility] Triage Call Report Triage Record Num: [id number] Operator: April Finney Patient Name: Ryan [name] Call Date & Time: 05/17/2010 5:49:44PM Patient Phone: [phone] PCP: Eugenio Hoes. Todd Patient Gender: Male PCP Fax : [phone] Patient DOB: Dec 22, 1985 Practice Name: [facility] Reason for Call: Loistine Chance calling about cough and congestion have returned. Coughing up yellow phlegm. Afebrile. Also having sinus pain and pressure. No emergent symptoms. See in 24 hrs care advice given. Protocol(s) Used: Cough - Adult Recommended Outcome per Protocol: See Provider within 24 hours Reason for Outcome: Productive cough with colored sputum (other than clear or white sputum) Care Advice: ~ Use a cool mist humidifier to moisten air. Be sure to clean according to manufacturer's instructions. Limit or avoid exposure to irritants and allergens (e.g. air pollution, smoke/smoking, chemicals, dust, pollen, pet dander, etc.) ~ Call provider if fever greater than 101.5 F (38.6 C) or 100.5 F (38.1C) in an immunocompromised patient (such as diabetes, HIV/AIDS, renal disease, chemotherapy, organ transplant, or chronic steroid use) has not improved in 24 hours. ~ Increase fluids to 8-12 eight oz (1.6 to 2.4 liters) glasses per day, half of them to be water. Soups, popsicles, fruit juices, non-caffeinated sodas (unless restricting sodium intake), jello, broths, decaf teas, etc. are all okay. Warm fluids can be soothing. ~ ~ If you can, stop smoking now and avoid all secondhand smoke. ~ HEALTH PROMOTION / MAINTENANCE ~ SYMPTOM / CONDITION MANAGEMENT ~ CAUTIONS Coughing up mucus or phlegm helps to get rid of an infection. A productive cough should not be stopped. A cough medicine with guaifenesin (Robitussin, Mucinex) can help loosen the mucus. Cough medicine with dextromethorphan (DM) should be avoided. Drinking lots of fluids can help loosen the mucus too, especially warm  fluids. ~ 05/17/2010 5:59:20PM Page 1 of 1 [id number]

## 2010-05-18 NOTE — Progress Notes (Signed)
  Subjective:    Patient ID: Ryan [name], male    DOB: Jul 16, 1985, 25 y.o.   MRN: [id number]  HPIPhillip is a 25 year old male, single, smoker, who comes in today for reevaluation of a cough.  We saw him two weeks ago with a viral syndrome with secondary wheezing.  At that time.  We advised him to stop smoking, which is not done.  He states he is intolerant of the shin to one tablet twice a day.  Gave him strange dreams, intolerant of the nicotine gum and patches.  I explained to [name] that we cannot get his lungs to get better until he stopped smoking.  We will try a half a tablet a chantix daily.  Pulmonary review of systems otherwise negative.  He's been on prednisone burst and taper Hydromet for cough.  He's been off the prednisone now for 4 days.  No fever no sputum production    Review of Systems Negative except for above    Objective:   Physical Exam Well-developed well-nourished, male in no acute distress.  Examination of the HEENT were negative.  Neck is supple.  No adenopathy.  Lungs show symmetrical.  Breath sounds bilateral wheezing       Assessment & Plan:  URI, tobacco abuse, asthma,  Plan,,,,,,, stop smoking completely, chantix one half tablet daily.  Prednisone two tabs x 3 days, then taper.  Hydromet one half to 1 teaspoon 3 times a day as needed for cough.  Empirically add doxycycline 100 mg b.i.d. For 10 days.  Follow-up in 10 days

## 2010-05-26 ENCOUNTER — Encounter: Payer: Self-pay | Admitting: Family Medicine

## 2010-05-27 ENCOUNTER — Encounter: Payer: Self-pay | Admitting: Family Medicine

## 2010-05-27 ENCOUNTER — Ambulatory Visit (INDEPENDENT_AMBULATORY_CARE_PROVIDER_SITE_OTHER): Payer: BC Managed Care – PPO | Admitting: Family Medicine

## 2010-05-27 DIAGNOSIS — F410 Panic disorder [episodic paroxysmal anxiety] without agoraphobia: Secondary | ICD-10-CM

## 2010-05-27 DIAGNOSIS — J45909 Unspecified asthma, uncomplicated: Secondary | ICD-10-CM

## 2010-05-27 DIAGNOSIS — F17200 Nicotine dependence, unspecified, uncomplicated: Secondary | ICD-10-CM

## 2010-05-27 NOTE — Progress Notes (Signed)
  Subjective:    Patient ID: Ryan [name], male    DOB: [birth year], 25 y.o.   MRN: [id number]  HPI Aneta Mins is a 25 year old single male, smoker, who comes in today for follow-up of 3 problems.  We recently put him on prednisone burst and taper and doxycycline.  He developed a viral syndrome, and it flared up his asthma.  He continues to smoke.  He is taking his medication.  Yesterday he started a half of prednisone a day.  He states he feels much better.  He also takes Ativan .5 b.i.d. And Celexa 40 mg daily for combination of anxiety and depression.  He also has a history of ADHD currently not on medication.  He finds it difficult to give up tobacco, which is not uncommon for people with ADD   Review of Systems Negative except for above    Objective:   Physical Exam    Well-developed well-nourished, fidgety, male no acute distress.  HEENT negative.  Neck was supple.  Lungs symmetrical breath sounds at late expiratory wheezing mild    Assessment & Plan:  Asthma,,,,,,,, resolving Taper medication.  Anxiety, depression,,,,,,,,,,, increase Celexa to 60 mg nightly continue lorazepam .5 b.i.d..  ADHD,,,,,, consider restarting medication

## 2010-05-27 NOTE — Patient Instructions (Signed)
Take prednisone one half tab today, and Friday skip the weekend.  The next week take a half a tablet Monday, Wednesday, Friday, for a two-week taper.  Increase the Celexa to 60 mg daily and continue the Ativan .5 b.i.d.  Call and make an appointment to see Dr. Andee Poles for consultation

## 2010-05-31 ENCOUNTER — Other Ambulatory Visit: Payer: Self-pay | Admitting: *Deleted

## 2010-05-31 DIAGNOSIS — F419 Anxiety disorder, unspecified: Secondary | ICD-10-CM

## 2010-05-31 MED ORDER — LORAZEPAM 0.5 MG PO TABS: 0.5000 mg | ORAL_TABLET | Freq: Every evening | ORAL | Status: DC | PRN

## 2010-05-31 NOTE — Telephone Encounter (Signed)
Pt called to check on status of refill..... // rs  05/31/10@ 4:06pm.

## 2010-05-31 NOTE — Telephone Encounter (Signed)
50

## 2010-05-31 NOTE — Telephone Encounter (Signed)
rx called in

## 2010-05-31 NOTE — Telephone Encounter (Signed)
patient  Would like a refill of ativan.  Is this okay to fill?

## 2010-06-03 ENCOUNTER — Telehealth: Payer: Self-pay | Admitting: Internal Medicine

## 2010-06-03 NOTE — Telephone Encounter (Signed)
Please inform patient of the recommendations and schedule an office visit with Dr. Tawanna Cooler next week

## 2010-06-03 NOTE — Telephone Encounter (Signed)
Agree the patient should cut his lorazepam dose by 50%. Schedule office  Visit with Dr. Tawanna Cooler next week

## 2010-06-03 NOTE — Telephone Encounter (Signed)
Mom is concerning because pt is on lorazepam and he is drinking excessively. Mom would like to wean pt off of med. Please advise. (doc todd pt)

## 2010-06-04 ENCOUNTER — Encounter: Payer: Self-pay | Admitting: Internal Medicine

## 2010-06-04 ENCOUNTER — Ambulatory Visit (INDEPENDENT_AMBULATORY_CARE_PROVIDER_SITE_OTHER): Payer: BC Managed Care – PPO | Admitting: Internal Medicine

## 2010-06-04 DIAGNOSIS — F419 Anxiety disorder, unspecified: Secondary | ICD-10-CM

## 2010-06-04 DIAGNOSIS — F410 Panic disorder [episodic paroxysmal anxiety] without agoraphobia: Secondary | ICD-10-CM

## 2010-06-04 DIAGNOSIS — F411 Generalized anxiety disorder: Secondary | ICD-10-CM

## 2010-06-04 DIAGNOSIS — I10 Essential (primary) hypertension: Secondary | ICD-10-CM

## 2010-06-04 NOTE — Telephone Encounter (Signed)
patient  Coming in to see dr Kirtland Bouchard today

## 2010-06-04 NOTE — Progress Notes (Signed)
  Subjective:    Patient ID: Ryan [name], male    DOB: [birth year], 25 y.o.   MRN: [id number]  HPI   In 26 year old patient who has a history of a panic disorder. He is controlled on citalopram 60 mg daily and has been also on lorazepam when necessary. Approximately one hour ago began having chest pain shortness of breath extreme anxiety associated with some hyperventilation and numbness involving the arms. He is accompanied by his mother who is concerned about his drinking along with lorazepam use. He has treated hypertension.   He states that he has had 2 episodes of binge drinking over the past month    Review of Systems  Constitutional: Negative for fever, chills, appetite change and fatigue.  HENT: Negative for hearing loss, ear pain, congestion, sore throat, trouble swallowing, neck stiffness, dental problem, voice change and tinnitus.   Eyes: Negative for pain, discharge and visual disturbance.  Respiratory: Positive for chest tightness and shortness of breath. Negative for cough, wheezing and stridor.   Cardiovascular: Negative for chest pain, palpitations and leg swelling.  Gastrointestinal: Negative for nausea, vomiting, abdominal pain, diarrhea, constipation, blood in stool and abdominal distention.  Genitourinary: Negative for urgency, hematuria, flank pain, discharge, difficulty urinating and genital sores.  Musculoskeletal: Negative for myalgias, back pain, joint swelling, arthralgias and gait problem.  Skin: Negative for rash.  Neurological: Positive for light-headedness and numbness. Negative for dizziness, syncope, speech difficulty, weakness and headaches.  Hematological: Negative for adenopathy. Does not bruise/bleed easily.  Psychiatric/Behavioral: Negative for behavioral problems and dysphoric mood. The patient is not nervous/anxious.        Objective:   Physical Exam  Constitutional: He is oriented to person, place, and time. He appears well-developed and  well-nourished.        Overweight;  Anxious but in no acute distress no diaphoresis  O2 saturation 97  Pulse rate 78  HENT:  Head: Normocephalic.  Right Ear: External ear normal.  Left Ear: External ear normal.  Eyes: Conjunctivae and EOM are normal.  Neck: Normal range of motion.  Cardiovascular: Normal rate, regular rhythm and normal heart sounds.   Pulmonary/Chest: Breath sounds normal. He has no rales. He exhibits no tenderness.        Mild hyperventilation  Abdominal: Soft. Bowel sounds are normal.  Musculoskeletal: Normal range of motion. He exhibits no edema and no tenderness.  Neurological: He is alert and oriented to person, place, and time.  Psychiatric: He has a normal mood and affect. His behavior is normal.          Assessment & Plan:   panic attack with hyperventilation  Situation was discussed with the patient's mother and the patient. She was given an additional prescription for lorazepam to take 2 every 4-6 hours as needed for the anxiety. This medicine will be dispensed by her. The patient will follow up with her PCP next week.

## 2010-06-04 NOTE — Patient Instructions (Signed)
Lorazepam-  2 tablets every 4-6 hours as needed for anxiety  followup with Dr. Tawanna Cooler next week   Discontinue all alcohol use

## 2010-06-08 ENCOUNTER — Ambulatory Visit (INDEPENDENT_AMBULATORY_CARE_PROVIDER_SITE_OTHER): Payer: BC Managed Care – PPO | Admitting: Family Medicine

## 2010-06-08 ENCOUNTER — Encounter: Payer: Self-pay | Admitting: Family Medicine

## 2010-06-08 DIAGNOSIS — J45909 Unspecified asthma, uncomplicated: Secondary | ICD-10-CM

## 2010-06-08 DIAGNOSIS — F17200 Nicotine dependence, unspecified, uncomplicated: Secondary | ICD-10-CM

## 2010-06-08 NOTE — Patient Instructions (Signed)
Take one prednisone, Wednesday, and Friday, then stop also stop smoking

## 2010-06-08 NOTE — Progress Notes (Signed)
  Subjective:    Patient ID: Ryan [name], male    DOB: [birth year], 25 y.o.   MRN: [id number]  HPI Ryan [name] is a 25 year old single male, smoker, who comes in today with accompanied by his mother for follow-up of asthma.  Extremity on 10 mg prednisone, Monday, Wednesday, Friday, and his symptoms are gone.  However, he continues to smoke a half a pack a cigarettes a day.  He also got arrested by the police.  He was caught with some marijuana.  There was an issue of possible impairment in too much  going out late at night.  He has an appointment to see a psychiatrist to more   Review of Systems    Negative Objective:   Physical Exam    Well-developed well-nourished, male in no acute distress.  Lungs are clear to auscultation    Assessment & Plan:  Asthma resolved Taper prednisone.  Tobacco abuse.  Again, recommend no smoking

## 2010-06-20 LAB — CBC
HCT: 42.3 % (ref 39.0–52.0)
Hemoglobin: 14.1 g/dL (ref 13.0–17.0)
MCHC: 33.4 g/dL (ref 30.0–36.0)
MCV: 94.7 fL (ref 78.0–100.0)
Platelets: 253 10*3/uL (ref 150–400)
RBC: 4.47 MIL/uL (ref 4.22–5.81)
RDW: 12.6 % (ref 11.5–15.5)
WBC: 15.1 10*3/uL — ABNORMAL HIGH (ref 4.0–10.5)

## 2010-06-20 LAB — URINALYSIS, ROUTINE W REFLEX MICROSCOPIC
Bilirubin Urine: NEGATIVE
Glucose, UA: NEGATIVE mg/dL
Hgb urine dipstick: NEGATIVE
Ketones, ur: NEGATIVE mg/dL
Nitrite: NEGATIVE
Protein, ur: NEGATIVE mg/dL
Specific Gravity, Urine: 1.008 (ref 1.005–1.030)
Urobilinogen, UA: 0.2 mg/dL (ref 0.0–1.0)
pH: 7 (ref 5.0–8.0)

## 2010-06-20 LAB — DIFFERENTIAL
Basophils Absolute: 0.1 10*3/uL (ref 0.0–0.1)
Basophils Relative: 1 % (ref 0–1)
Eosinophils Absolute: 0 10*3/uL (ref 0.0–0.7)
Eosinophils Relative: 0 % (ref 0–5)
Lymphocytes Relative: 15 % (ref 12–46)
Lymphs Abs: 2.3 10*3/uL (ref 0.7–4.0)
Monocytes Absolute: 1 10*3/uL (ref 0.1–1.0)
Monocytes Relative: 7 % (ref 3–12)
Neutro Abs: 11.7 10*3/uL — ABNORMAL HIGH (ref 1.7–7.7)
Neutrophils Relative %: 78 % — ABNORMAL HIGH (ref 43–77)

## 2010-06-20 LAB — COMPREHENSIVE METABOLIC PANEL
ALT: 31 U/L (ref 0–53)
AST: 23 U/L (ref 0–37)
Albumin: 4.1 g/dL (ref 3.5–5.2)
Alkaline Phosphatase: 111 U/L (ref 39–117)
BUN: 10 mg/dL (ref 6–23)
CO2: 24 mEq/L (ref 19–32)
Calcium: 9.7 mg/dL (ref 8.4–10.5)
Chloride: 104 mEq/L (ref 96–112)
Creatinine, Ser: 0.74 mg/dL (ref 0.4–1.5)
GFR calc Af Amer: >60 mL/min (ref >60)
GFR calc non Af Amer: >60 mL/min (ref >60)
Glucose, Bld: 105 mg/dL — ABNORMAL HIGH (ref 70–99)
Potassium: 3.8 mEq/L (ref 3.5–5.1)
Sodium: 135 mEq/L (ref 135–145)
Total Bilirubin: 0.5 mg/dL (ref 0.3–1.2)
Total Protein: 7 g/dL (ref 6.0–8.3)

## 2010-07-21 ENCOUNTER — Ambulatory Visit (INDEPENDENT_AMBULATORY_CARE_PROVIDER_SITE_OTHER): Payer: BC Managed Care – PPO | Admitting: Family Medicine

## 2010-07-21 ENCOUNTER — Encounter: Payer: Self-pay | Admitting: Family Medicine

## 2010-07-21 VITALS — BP 140/90 | Temp 97.8°F | Wt 198.0 lb

## 2010-07-21 DIAGNOSIS — M549 Dorsalgia, unspecified: Secondary | ICD-10-CM

## 2010-07-21 MED ORDER — HYDROCODONE-ACETAMINOPHEN 7.5-750 MG PO TABS: ORAL_TABLET | ORAL | Status: DC

## 2010-07-21 MED ORDER — CYCLOBENZAPRINE HCL 10 MG PO TABS: 10.0000 mg | ORAL_TABLET | Freq: Three times a day (TID) | ORAL | Status: DC | PRN

## 2010-07-21 NOTE — Patient Instructions (Signed)
Stay at bedrest at home.  Motrin 600 mg twice daily with food.  No alcohol, tobacco, or aspirin.  Flexeril and Vicodin,,,,,,,,,, one half to one tablet of each 3 times a day.  We will put in a referral request to neurosurgeon

## 2010-07-21 NOTE — Progress Notes (Signed)
  Subjective:    Patient ID: Ryan [name], male    DOB: [birth year], 25 y.o.   MRN: [id number]  HPIPhillip is a 25 year old male, who comes in today for evaluation of back pain.  He states last Wednesday at work he developed low back pain.  He states his right and left lumbar pain over the last week.  The pain has gotten worse.  He describes it now is constant, sharp, radiating down his left leg to his left foot, and 8 on a scale of one to 10.  This is similar to the pain.  He had 3 years ago.  At that time.  We treated him conservatively.  However, it didn't work.  We sent him to see Dr. Glee Arvin, the neurosurgeon.  After epidural steroid injections.  His pain went away, and he been pain free for the past 3 years.  Review of systems otherwise negative    Review of Systems    General musculoskeletal, neurologic, review of systems negative Objective:   Physical Exam    Well-developed well-nourished, male in no acute distress.  Examination of the spine shows no bony tenderness.  Sensation muscle strength.  Reflexes all normal.  Positive straight leg raising left leg at 40 degrees   Assessment & Plan:  Lumbar disk disease,,,,,,,,,,,,,,,, Motrin 600 b.i.d., Flexeril, and Vicodin, one half of each t.i.d., refer to neurosurgery ASAP, bedrest at home

## 2010-07-26 ENCOUNTER — Telehealth: Payer: Self-pay | Admitting: Family Medicine

## 2010-07-26 NOTE — Telephone Encounter (Signed)
Pt called back again checking status of diff med.

## 2010-07-26 NOTE — Telephone Encounter (Signed)
Pt called and said that the hydrocodone is making him itch at night and have sweats. Pt req different type of med. Pt said they Oxycodone didn't affect pt in this way.

## 2010-07-26 NOTE — Telephone Encounter (Signed)
Pt call back checking on statue of med. Pt is aware waiting on doc reply.

## 2010-07-27 NOTE — Telephone Encounter (Signed)
Spoke with patient.

## 2010-07-27 NOTE — Telephone Encounter (Signed)
That the pain medicine, and a half half a tablet two to 3 times a day however, if this is not working, then we will need to have him go see a specialist.  I don't prescribe OxyContin

## 2010-08-03 ENCOUNTER — Other Ambulatory Visit: Payer: Self-pay | Admitting: Neurosurgery

## 2010-08-03 ENCOUNTER — Ambulatory Visit
Admission: RE | Admit: 2010-08-03 | Discharge: 2010-08-03 | Disposition: A | Discharge status: Home / Self Care | Payer: BC Managed Care – PPO | Source: Ambulatory Visit | Attending: Neurosurgery | Admitting: Neurosurgery

## 2010-08-03 DIAGNOSIS — M545 Low back pain, unspecified: Secondary | ICD-10-CM

## 2010-08-07 ENCOUNTER — Ambulatory Visit
Admission: RE | Admit: 2010-08-07 | Discharge: 2010-08-07 | Disposition: A | Discharge status: Home / Self Care | Payer: BC Managed Care – PPO | Source: Ambulatory Visit | Attending: Neurosurgery | Admitting: Neurosurgery

## 2010-08-07 DIAGNOSIS — M545 Low back pain, unspecified: Secondary | ICD-10-CM
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
## 2010-08-13 NOTE — Assessment & Plan Note (Signed)
[facility]                                 ON-CALL NOTE   SEENA, FACE                MRN:          [id number]  DATE:05/27/2006                            DOB:          Nov 17, 1985    The patient has been under treatment for pneumonia. He needs a refill on  his Tussionex [facility] but it is not available per pharmacy. I called [facility] at  [phone] [facility]) and they said indeed it was substituted with  Endal. He has had three 8 ounce bottles of cough medicine in the last  month but will go ahead and call in Endal 6 ounces today and have Dr.  Tawanna Cooler follow-up for further refills.     Neta Mends. Panosh, MD  Electronically Signed    WKP/MedQ  DD: 05/27/2006  DT: 05/27/2006  Job #: [id number]

## 2010-08-13 NOTE — Assessment & Plan Note (Signed)
[facility]                                 ON-CALL NOTE   UZIEL, COVAULT                  MRN:          [id number]  DATE:06/16/2006                            DOB:          Nov 24, 1985    TIME CALL RECEIVED:  2:15 p.m.   TELEPHONE:  [phone]   PRIMARY CARE PHYSICIAN:  Alonza Smoker, M.D.   TELEPHONE TRIAGE NOTE:  The patient states that he had been in the  hospital for 3 days for vomiting blood. He is not sure what the actual  diagnosis was. He was discharged yesterday. He has continue to have some  vomiting since then and now today, is having black stools. He feels  quite weak. My response is that he should go to the emergency room now.     Tera Mater. Clent Ridges, MD  Electronically Signed    SAF/MedQ  DD: 06/17/2006  DT: 06/17/2006  Job #: [id number]
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
## 2010-08-20 ENCOUNTER — Other Ambulatory Visit: Payer: Self-pay | Admitting: Family Medicine

## 2010-09-06 ENCOUNTER — Encounter: Payer: Self-pay | Admitting: Family Medicine

## 2010-09-06 ENCOUNTER — Ambulatory Visit (INDEPENDENT_AMBULATORY_CARE_PROVIDER_SITE_OTHER): Payer: BC Managed Care – PPO | Admitting: Family Medicine

## 2010-09-06 VITALS — BP 160/90 | Temp 98.5°F

## 2010-09-06 DIAGNOSIS — J209 Acute bronchitis, unspecified: Secondary | ICD-10-CM

## 2010-09-06 MED ORDER — AZITHROMYCIN 250 MG PO TABS
250.0000 mg | ORAL_TABLET | Freq: Every day | ORAL | Status: AC
Start: 2010-09-06 — End: 2010-09-11

## 2010-09-06 NOTE — Progress Notes (Signed)
  Subjective:    Patient ID: Ryan [name], male    DOB: [birth year], 25 y.o.   MRN: [id number]  HPI Patient seen with persistent productive cough over the past week. He is smoker. Reported history of asthma but currently not taking any medications. Possibly some mild wheezing off and on. Intolerant of steroids in the past secondary to side effects. He denies any fever. Developed a sore throat last night. Patient also has hypertension but did not take his losartan earlier today.   Review of Systems  Constitutional: Positive for fatigue. Negative for fever and chills.  HENT: Positive for sore throat. Negative for postnasal drip and sinus pressure.   Respiratory: Positive for cough and wheezing. Negative for shortness of breath.   Cardiovascular: Negative for chest pain, palpitations and leg swelling.  Neurological: Negative for headaches.       Objective:   Physical Exam  Constitutional: He appears well-developed and well-nourished. No distress.  HENT:  Right Ear: External ear normal.  Left Ear: External ear normal.  Mouth/Throat: Oropharynx is clear and moist. No oropharyngeal exudate.  Neck: Neck supple.  Cardiovascular: Normal rate and regular rhythm.   Pulmonary/Chest: Effort normal and breath sounds normal. No respiratory distress. He has no wheezes. He has no rales.  Lymphadenopathy:    He has no cervical adenopathy.          Assessment & Plan:  Acute bronchitis in a smoker. Start Zithromax. Plenty of fluids. Consider getting back on steroid inhaler if more frequent asthma exacerbations

## 2011-01-10 LAB — COMPREHENSIVE METABOLIC PANEL
ALT: 38
AST: 35
Albumin: 3.9
Alkaline Phosphatase: 88
BUN: 10
CO2: 26
Calcium: 9.8
Chloride: 102
Creatinine, Ser: 1.01
GFR calc Af Amer: >60
GFR calc non Af Amer: >60
Glucose, Bld: 100 — ABNORMAL HIGH
Potassium: 4.2
Sodium: 138
Total Bilirubin: 0.6
Total Protein: 7

## 2011-01-10 LAB — URINE MICROSCOPIC-ADD ON

## 2011-01-10 LAB — URINALYSIS, ROUTINE W REFLEX MICROSCOPIC
Bilirubin Urine: NEGATIVE
Glucose, UA: NEGATIVE
Hgb urine dipstick: NEGATIVE
Ketones, ur: NEGATIVE
Nitrite: NEGATIVE
Protein, ur: NEGATIVE
Specific Gravity, Urine: 1.008
Urobilinogen, UA: 0.2
pH: 6.5

## 2011-01-10 LAB — DIFFERENTIAL
Basophils Absolute: 0.1
Basophils Relative: 1
Eosinophils Absolute: 0
Eosinophils Relative: 0
Lymphocytes Relative: 36
Lymphs Abs: 2.7
Monocytes Absolute: 0.6
Monocytes Relative: 9
Neutro Abs: 3.9
Neutrophils Relative %: 54

## 2011-01-10 LAB — CBC
HCT: 41.1
Hemoglobin: 14.5
MCHC: 35.3
MCV: 93
Platelets: 308
RBC: 4.42
RDW: 12.6
WBC: 7.3

## 2011-01-10 LAB — LIPASE, BLOOD: Lipase: 16

## 2011-01-10 LAB — AMYLASE: Amylase: 48

## 2011-01-22 ENCOUNTER — Emergency Department (HOSPITAL_COMMUNITY)
Admission: EM | Admit: 2011-01-22 | Discharge: 2011-01-23 | Disposition: A | Discharge status: Other Acute Inpatient Hospital | Payer: BC Managed Care – PPO | Attending: Emergency Medicine | Admitting: Emergency Medicine

## 2011-01-22 DIAGNOSIS — T6592XA Toxic effect of unspecified substance, intentional self-harm, initial encounter: Secondary | ICD-10-CM | POA: Insufficient documentation

## 2011-01-22 DIAGNOSIS — T40904A Poisoning by unspecified psychodysleptics [hallucinogens], undetermined, initial encounter: Secondary | ICD-10-CM | POA: Insufficient documentation

## 2011-01-22 DIAGNOSIS — I10 Essential (primary) hypertension: Secondary | ICD-10-CM | POA: Insufficient documentation

## 2011-01-22 DIAGNOSIS — Z79899 Other long term (current) drug therapy: Secondary | ICD-10-CM | POA: Insufficient documentation

## 2011-01-22 DIAGNOSIS — T405X1A Poisoning by cocaine, accidental (unintentional), initial encounter: Secondary | ICD-10-CM | POA: Insufficient documentation

## 2011-01-22 DIAGNOSIS — T426X1A Poisoning by other antiepileptic and sedative-hypnotic drugs, accidental (unintentional), initial encounter: Secondary | ICD-10-CM | POA: Insufficient documentation

## 2011-01-22 DIAGNOSIS — T50992A Poisoning by other drugs, medicaments and biological substances, intentional self-harm, initial encounter: Secondary | ICD-10-CM | POA: Insufficient documentation

## 2011-01-22 DIAGNOSIS — T504X4A Poisoning by drugs affecting uric acid metabolism, undetermined, initial encounter: Secondary | ICD-10-CM | POA: Insufficient documentation

## 2011-01-22 DIAGNOSIS — T5191XA Toxic effect of unspecified alcohol, accidental (unintentional), initial encounter: Secondary | ICD-10-CM | POA: Insufficient documentation

## 2011-01-22 DIAGNOSIS — T43624A Poisoning by amphetamines, undetermined, initial encounter: Secondary | ICD-10-CM | POA: Insufficient documentation

## 2011-01-22 DIAGNOSIS — T43502A Poisoning by unspecified antipsychotics and neuroleptics, intentional self-harm, initial encounter: Secondary | ICD-10-CM | POA: Insufficient documentation

## 2011-01-22 LAB — CBC
HCT: 41.9 % (ref 39.0–52.0)
Hemoglobin: 14.7 g/dL (ref 13.0–17.0)
MCH: 32.8 pg (ref 26.0–34.0)
MCHC: 35.1 g/dL (ref 30.0–36.0)
MCV: 93.5 fL (ref 78.0–100.0)
Platelets: 291 10*3/uL (ref 150–400)
RBC: 4.48 MIL/uL (ref 4.22–5.81)
RDW: 12.2 % (ref 11.5–15.5)
WBC: 10.4 10*3/uL (ref 4.0–10.5)

## 2011-01-22 LAB — DIFFERENTIAL
Basophils Absolute: 0.1 10*3/uL (ref 0.0–0.1)
Basophils Relative: 1 % (ref 0–1)
Eosinophils Absolute: 0.2 10*3/uL (ref 0.0–0.7)
Eosinophils Relative: 2 % (ref 0–5)
Lymphocytes Relative: 36 % (ref 12–46)
Lymphs Abs: 3.8 10*3/uL (ref 0.7–4.0)
Monocytes Absolute: 0.8 10*3/uL (ref 0.1–1.0)
Monocytes Relative: 8 % (ref 3–12)
Neutro Abs: 5.6 10*3/uL (ref 1.7–7.7)
Neutrophils Relative %: 54 % (ref 43–77)

## 2011-01-22 LAB — SALICYLATE LEVEL: Salicylate Lvl: <2 mg/dL — ABNORMAL LOW (ref 2.8–20.0)

## 2011-01-22 LAB — COMPREHENSIVE METABOLIC PANEL
ALT: 26 U/L (ref 0–53)
AST: 18 U/L (ref 0–37)
Albumin: 4.3 g/dL (ref 3.5–5.2)
Alkaline Phosphatase: 100 U/L (ref 39–117)
BUN: 10 mg/dL (ref 6–23)
CO2: 18 mEq/L — ABNORMAL LOW (ref 19–32)
Calcium: 9.2 mg/dL (ref 8.4–10.5)
Chloride: 108 mEq/L (ref 96–112)
Creatinine, Ser: 0.78 mg/dL (ref 0.50–1.35)
GFR calc Af Amer: >90 mL/min (ref >90)
GFR calc non Af Amer: >90 mL/min (ref >90)
Glucose, Bld: 100 mg/dL — ABNORMAL HIGH (ref 70–99)
Potassium: 3.3 mEq/L — ABNORMAL LOW (ref 3.5–5.1)
Sodium: 139 mEq/L (ref 135–145)
Total Bilirubin: 0.1 mg/dL — ABNORMAL LOW (ref 0.3–1.2)
Total Protein: 7.6 g/dL (ref 6.0–8.3)

## 2011-01-22 LAB — RAPID URINE DRUG SCREEN, HOSP PERFORMED
Amphetamines: POSITIVE — AB
Barbiturates: NOT DETECTED
Benzodiazepines: NOT DETECTED
Cocaine: POSITIVE — AB
Opiates: NOT DETECTED
Tetrahydrocannabinol: POSITIVE — AB

## 2011-01-22 LAB — ACETAMINOPHEN LEVEL: Acetaminophen (Tylenol), Serum: <15 ug/mL (ref 10–30)

## 2011-01-22 LAB — ETHANOL: Alcohol, Ethyl (B): 77 mg/dL — ABNORMAL HIGH (ref 0–11)

## 2011-01-23 ENCOUNTER — Telehealth: Payer: Self-pay | Admitting: Family Medicine

## 2011-01-23 ENCOUNTER — Inpatient Hospital Stay (HOSPITAL_COMMUNITY)
Admission: RE | Admit: 2011-01-23 | Discharge: 2011-01-25 | DRG: 751 | Disposition: A | Discharge status: Home / Self Care | Payer: BC Managed Care – PPO | Source: Ambulatory Visit | Attending: Psychiatry | Admitting: Psychiatry

## 2011-01-23 DIAGNOSIS — G4700 Insomnia, unspecified: Secondary | ICD-10-CM

## 2011-01-23 DIAGNOSIS — I10 Essential (primary) hypertension: Secondary | ICD-10-CM

## 2011-01-23 DIAGNOSIS — F1020 Alcohol dependence, uncomplicated: Principal | ICD-10-CM

## 2011-01-23 DIAGNOSIS — T43294A Poisoning by other antidepressants, undetermined, initial encounter: Secondary | ICD-10-CM

## 2011-01-23 DIAGNOSIS — T50992A Poisoning by other drugs, medicaments and biological substances, intentional self-harm, initial encounter: Secondary | ICD-10-CM

## 2011-01-23 DIAGNOSIS — F1210 Cannabis abuse, uncomplicated: Secondary | ICD-10-CM

## 2011-01-23 DIAGNOSIS — F39 Unspecified mood [affective] disorder: Secondary | ICD-10-CM

## 2011-01-23 DIAGNOSIS — T426X1A Poisoning by other antiepileptic and sedative-hypnotic drugs, accidental (unintentional), initial encounter: Secondary | ICD-10-CM

## 2011-01-23 DIAGNOSIS — T43502A Poisoning by unspecified antipsychotics and neuroleptics, intentional self-harm, initial encounter: Secondary | ICD-10-CM

## 2011-01-23 DIAGNOSIS — J45909 Unspecified asthma, uncomplicated: Secondary | ICD-10-CM

## 2011-01-23 DIAGNOSIS — T405X1A Poisoning by cocaine, accidental (unintentional), initial encounter: Secondary | ICD-10-CM

## 2011-01-23 DIAGNOSIS — T510X4A Toxic effect of ethanol, undetermined, initial encounter: Secondary | ICD-10-CM

## 2011-01-23 DIAGNOSIS — F1910 Other psychoactive substance abuse, uncomplicated: Secondary | ICD-10-CM

## 2011-01-23 DIAGNOSIS — Z880 Allergy status to penicillin: Secondary | ICD-10-CM

## 2011-01-23 DIAGNOSIS — T6592XA Toxic effect of unspecified substance, intentional self-harm, initial encounter: Secondary | ICD-10-CM

## 2011-01-23 NOTE — Telephone Encounter (Signed)
On call note.  Father of pt called about possible overdose.  I am not involved in patient's care.  Father had questions about plan for pt.  I talked with Dr. Ignacia Palma, ER MD at [facility], attending for pt.  He agreed to talk with father, as much as confidentiality would allow. I notified the father.  He understood.

## 2011-01-24 DIAGNOSIS — F1210 Cannabis abuse, uncomplicated: Secondary | ICD-10-CM

## 2011-01-24 DIAGNOSIS — F1994 Other psychoactive substance use, unspecified with psychoactive substance-induced mood disorder: Secondary | ICD-10-CM

## 2011-01-24 DIAGNOSIS — F1020 Alcohol dependence, uncomplicated: Secondary | ICD-10-CM
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
## 2011-01-27 NOTE — Discharge Summary (Signed)
NAMEELIZAR, Ryan [name]         ACCOUNT NO.:  [id number]  MEDICAL RECORD NO.:  [id number]  LOCATION:  0306                          [facility]ACILITY[facility]  [facility]  PHYSICIAN:  Orson Aloe, MD       DATE OF BIRTH:  [birth year]  DATE OF ADMISSION:  01/23/2011 DATE OF DISCHARGE:  01/25/2011                              DISCHARGE SUMMARY   REASON FOR ADMISSION:  This 25 year old voluntarily admitted male presented with polypharmacy overdose, Celexa, topiramate, cocaine and alcohol.  His father reported he left a suicide note.  A trigger for the overdose was an argument with a girlfriend, who threatened to end the relationship.  He was drinking a pint of tequila and vodka in the past.  POSITIVE FINDINGS:  His UDS was positive for marijuana, cocaine and amphetamines.  His EKG was obtained, and he had a normal sinus rhythm. His QTc was 449, and it was thought to be a normal EKG.  He was admitted and given trazodone for insomnia, 25 mg of Librium as needed for withdrawal symptoms, eventually put back on Topamax 200 mg at bedtime, Celexa 40 mg at bedtime, Cozaar 50 mg in the morning on the 29th, and on the 30th he was discharged home, just had his home supply of medications.  In the notes, it was noted that he attended morning planning meeting, he had actively participated.  He presented with anxious mood and affect.  He was openly sharing the reason for entering the hospital.  He lives at home with a girlfriend in [place] and works.  He states he has been to [facility] for his medication management and therapy and is open to returning.  The social worker verified the patient had an appointment there and followup at after discharge.  The patient discussed using alcohol and was open to going to [employer] as well.  Safety plan and suicide prevention were discussed.  At the time of discharge, it was noted he denied any suicidal or homicidal ideation, and he had good eye contact.  He was  able to focus adequately in one-to-one and group settings.  He did seem a little bit nervous but stated that he was much better when he got started back on his Topamax.  He had clear goal-directed thoughts.  He was oriented x4. Recent and remote memory were intact.  His judgment was able to realize alcohol is a fatal disease and his insight seems to see the need to have a good sponsor recommended to him.  DISCHARGE DIAGNOSES:  Axis I:  Alcohol dependence.  Cannabis abuse. Substance abuse.  Mood disorder. Axis II:  Deferred. Axis III:  Hypertension. Axis IV:  Moderate. Axis V:  55.  He was not discharged on multiple antipsychotics.  He was not discharged on any antipsychotic.  Recommendations were to resume typical activity, resume typical diet, engage in activities that would be helpful for his serenity and his sobriety.  Continue citalopram 40 mg a day, losartan 50 mg every morning and Topamax 100 mg 1-1/2 tablets daily.  Other recommendations were the first month focus on speaker meetings, looking for similarities.  Work the steps honestly with a trusted sponsor and get obsessed with his recovery.  He  is also to go [facility].  Anne Fu is the PA there.  He has an appointment with them on the 7th of November at 3:20 p.m., and also with Dr. Beckey Downing at [facility] with an appointment on the 14th of November at 12:15 p.m.  Also recommended to attend 90 meetings in 90 days.  The patient seemed to appreciate and understand that recommendation.          ______________________________ Orson Aloe, MD     EW/MEDQ  D:  01/26/2011  T:  01/26/2011  Job:  [id number]  Electronically Signed by Orson Aloe  on 01/27/2011 10:17:15 AM

## 2011-01-31 ENCOUNTER — Other Ambulatory Visit: Payer: Self-pay | Admitting: Family Medicine

## 2011-02-02 ENCOUNTER — Encounter: Payer: Self-pay | Admitting: Family Medicine

## 2011-02-02 ENCOUNTER — Ambulatory Visit (INDEPENDENT_AMBULATORY_CARE_PROVIDER_SITE_OTHER): Payer: BC Managed Care – PPO | Admitting: Family Medicine

## 2011-02-02 DIAGNOSIS — Z23 Encounter for immunization: Secondary | ICD-10-CM

## 2011-02-02 DIAGNOSIS — F17200 Nicotine dependence, unspecified, uncomplicated: Secondary | ICD-10-CM

## 2011-02-02 DIAGNOSIS — J45909 Unspecified asthma, uncomplicated: Secondary | ICD-10-CM

## 2011-02-02 MED ORDER — BECLOMETHASONE DIPROPIONATE 40 MCG/ACT IN AERS: 2.0000 | INHALATION_SPRAY | Freq: Two times a day (BID) | RESPIRATORY_TRACT | Status: DC

## 2011-02-02 NOTE — Patient Instructions (Signed)
Drink lots of water.  10 mg of plain Claritin in the morning.  Qvar two puffs twice daily.  Remember to rinse and spit after using the Qvar.  Return next Monday for follow-up

## 2011-02-02 NOTE — Progress Notes (Signed)
  Subjective:    Patient ID: Ryan [name], male    DOB: 01-Feb-1986, 25 y.o.   MRN: [id number]  HPI Aneta Mins is a 25 year old single male, smoker, one half pack per day, who comes in today for evaluation of allergic rhinitis and asthma.  [facility] overdose of Topamax, Celexa, amphetamines, marijuana, and cocaine.  He sees his counselor on a regular basis.  He has a long, and history of allergic rhinitis and asthma and has been wheezing.   Review of Systems    General and pulmonary views systems otherwise negative Objective:   Physical Exam  Well-developed well-nourished man in no acute distress.  Examination of the HEENT are negative.  Neck was supple.  No adenopathy.  Lungs showed symmetrical.  Breath sounds bilateral, mild expiratory wheezing      Assessment & Plan:  Allergic rhinitis and asthma.  Plan plain, 10 mg, Claritin q.a.m., along with Qvar two puffs b.i.d. Follow-up in one week.  No smoking.  He declined.  The chantix program

## 2011-02-02 NOTE — Progress Notes (Signed)
Addended by: Kern Reap B on: 02/02/2011 03:03 PM   Modules accepted: Orders

## 2011-02-07 ENCOUNTER — Ambulatory Visit (INDEPENDENT_AMBULATORY_CARE_PROVIDER_SITE_OTHER): Payer: BC Managed Care – PPO | Admitting: Family Medicine

## 2011-02-07 ENCOUNTER — Encounter: Payer: Self-pay | Admitting: Family Medicine

## 2011-02-07 DIAGNOSIS — F17200 Nicotine dependence, unspecified, uncomplicated: Secondary | ICD-10-CM

## 2011-02-07 DIAGNOSIS — J45909 Unspecified asthma, uncomplicated: Secondary | ICD-10-CM

## 2011-02-07 NOTE — Progress Notes (Signed)
  Subjective:    Patient ID: Ryan [name], male    DOB: [birth year], 25 y.o.   MRN: [id number]  HPIPhillips is a 25 year old single male, smoker...... Half a pack a day....... Who comes in today for follow-up of asthma.  He is currently taking Qvar two puffs b.i.d. And states he feels better.  He continues to smoke 10 cigarettes a day.  He declined at the smoking cessation program.  I explained it will be very difficult to get his airways to quiet down.  If he continues to smoke.  He did get some success with the nicotine patches.  I recommend he try these again.  He is going back to see his psychiatrist today, who is treating him for the alcoholism, and depression    Review of Systems General and pulmonary review of systems otherwise negative    Objective:   Physical Exam Well-developed well-nourished, male in no acute distress.  Pulmonary exam shows symmetrical.  Breath sounds.  Mild late expiratory wheezing      Assessment & Plan:  Asthma plan continue inhaled steroid, two puffs b.i.d., stop smoking.  Follow-up in 4 weeks

## 2011-02-07 NOTE — Patient Instructions (Signed)
Continue the inhaler, two puffs twice daily.  Try the nicotine patches to help you to stop smoking.  Follow-up in 4 weeks

## 2011-02-13 ENCOUNTER — Emergency Department (INDEPENDENT_AMBULATORY_CARE_PROVIDER_SITE_OTHER): Payer: BC Managed Care – PPO

## 2011-02-13 ENCOUNTER — Encounter (HOSPITAL_BASED_OUTPATIENT_CLINIC_OR_DEPARTMENT_OTHER): Payer: Self-pay | Admitting: Emergency Medicine

## 2011-02-13 ENCOUNTER — Emergency Department (HOSPITAL_BASED_OUTPATIENT_CLINIC_OR_DEPARTMENT_OTHER)
Admission: EM | Admit: 2011-02-13 | Discharge: 2011-02-13 | Disposition: A | Discharge status: Home / Self Care | Payer: BC Managed Care – PPO | Attending: Emergency Medicine | Admitting: Emergency Medicine

## 2011-02-13 DIAGNOSIS — J323 Chronic sphenoidal sinusitis: Secondary | ICD-10-CM | POA: Insufficient documentation

## 2011-02-13 DIAGNOSIS — F1010 Alcohol abuse, uncomplicated: Secondary | ICD-10-CM | POA: Insufficient documentation

## 2011-02-13 DIAGNOSIS — R51 Headache: Secondary | ICD-10-CM | POA: Insufficient documentation

## 2011-02-13 DIAGNOSIS — M542 Cervicalgia: Secondary | ICD-10-CM

## 2011-02-13 DIAGNOSIS — J45909 Unspecified asthma, uncomplicated: Secondary | ICD-10-CM | POA: Insufficient documentation

## 2011-02-13 DIAGNOSIS — S0180XA Unspecified open wound of other part of head, initial encounter: Secondary | ICD-10-CM | POA: Insufficient documentation

## 2011-02-13 DIAGNOSIS — J0100 Acute maxillary sinusitis, unspecified: Secondary | ICD-10-CM

## 2011-02-13 DIAGNOSIS — J322 Chronic ethmoidal sinusitis: Secondary | ICD-10-CM | POA: Insufficient documentation

## 2011-02-13 DIAGNOSIS — I10 Essential (primary) hypertension: Secondary | ICD-10-CM | POA: Insufficient documentation

## 2011-02-13 DIAGNOSIS — S0181XA Laceration without foreign body of other part of head, initial encounter: Secondary | ICD-10-CM

## 2011-02-13 DIAGNOSIS — F17200 Nicotine dependence, unspecified, uncomplicated: Secondary | ICD-10-CM | POA: Insufficient documentation

## 2011-02-13 DIAGNOSIS — J0130 Acute sphenoidal sinusitis, unspecified: Secondary | ICD-10-CM

## 2011-02-13 MED ORDER — IBUPROFEN 800 MG PO TABS
800.0000 mg | ORAL_TABLET | Freq: Once | ORAL | Status: AC
  Administered 2011-02-13: 800 mg via ORAL

## 2011-02-13 MED ORDER — ACETAMINOPHEN 500 MG PO TABS
1000.0000 mg | ORAL_TABLET | Freq: Once | ORAL | Status: AC
Start: 2011-02-13 — End: 2011-02-13
  Administered 2011-02-13: 19:00:00 via ORAL

## 2011-02-13 MED ORDER — ACETAMINOPHEN 500 MG PO TABS
ORAL_TABLET | ORAL | Status: AC
  Filled 2011-02-13: qty 2

## 2011-02-13 MED ORDER — IBUPROFEN 800 MG PO TABS
ORAL_TABLET | ORAL | Status: AC
  Filled 2011-02-13: qty 1
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
## 2011-02-13 NOTE — ED Notes (Signed)
Deliah Boston, NP at bedside to dermabond pt's forehead laceration.
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
## 2011-03-07 ENCOUNTER — Ambulatory Visit: Payer: BC Managed Care – PPO | Admitting: Family Medicine

## 2011-04-03 ENCOUNTER — Emergency Department (HOSPITAL_BASED_OUTPATIENT_CLINIC_OR_DEPARTMENT_OTHER)
Admission: EM | Admit: 2011-04-03 | Discharge: 2011-04-03 | Disposition: A | Discharge status: Home / Self Care | Payer: BC Managed Care – PPO | Attending: Emergency Medicine | Admitting: Emergency Medicine

## 2011-04-03 ENCOUNTER — Encounter (HOSPITAL_BASED_OUTPATIENT_CLINIC_OR_DEPARTMENT_OTHER): Payer: Self-pay | Admitting: Emergency Medicine

## 2011-04-03 ENCOUNTER — Emergency Department (INDEPENDENT_AMBULATORY_CARE_PROVIDER_SITE_OTHER): Payer: BC Managed Care – PPO

## 2011-04-03 DIAGNOSIS — J45909 Unspecified asthma, uncomplicated: Secondary | ICD-10-CM | POA: Insufficient documentation

## 2011-04-03 DIAGNOSIS — Z79899 Other long term (current) drug therapy: Secondary | ICD-10-CM | POA: Insufficient documentation

## 2011-04-03 DIAGNOSIS — R05 Cough: Secondary | ICD-10-CM

## 2011-04-03 DIAGNOSIS — R197 Diarrhea, unspecified: Secondary | ICD-10-CM | POA: Insufficient documentation

## 2011-04-03 DIAGNOSIS — R1110 Vomiting, unspecified: Secondary | ICD-10-CM | POA: Insufficient documentation

## 2011-04-03 DIAGNOSIS — R059 Cough, unspecified: Secondary | ICD-10-CM

## 2011-04-03 DIAGNOSIS — R6883 Chills (without fever): Secondary | ICD-10-CM

## 2011-04-03 DIAGNOSIS — IMO0001 Reserved for inherently not codable concepts without codable children: Secondary | ICD-10-CM | POA: Insufficient documentation

## 2011-04-03 DIAGNOSIS — I10 Essential (primary) hypertension: Secondary | ICD-10-CM | POA: Insufficient documentation

## 2011-04-03 MED ORDER — HYDROCOD POLST-CHLORPHEN POLST 10-8 MG/5ML PO LQCR: 5.0000 mL | Freq: Two times a day (BID) | ORAL | Status: DC | PRN

## 2011-04-03 MED ORDER — PROMETHAZINE HCL 25 MG PO TABS: 25.0000 mg | ORAL_TABLET | Freq: Four times a day (QID) | ORAL | Status: DC | PRN

## 2011-04-03 MED ORDER — ONDANSETRON 4 MG PO TBDP
4.0000 mg | ORAL_TABLET | Freq: Once | ORAL | Status: AC
  Administered 2011-04-03: 4 mg via ORAL
  Filled 2011-04-03: qty 1

## 2011-04-03 NOTE — ED Notes (Signed)
Pt reports body aches, cough, NVD since yesterday

## 2011-04-03 NOTE — ED Notes (Signed)
Pt tolerating Sprite well.

## 2011-04-03 NOTE — ED Notes (Signed)
Sprite given for po challenge. 
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
## 2011-04-06 ENCOUNTER — Encounter: Payer: Self-pay | Admitting: Family

## 2011-04-06 ENCOUNTER — Ambulatory Visit (INDEPENDENT_AMBULATORY_CARE_PROVIDER_SITE_OTHER): Payer: BC Managed Care – PPO | Admitting: Family

## 2011-04-06 DIAGNOSIS — R05 Cough: Secondary | ICD-10-CM

## 2011-04-06 DIAGNOSIS — R319 Hematuria, unspecified: Secondary | ICD-10-CM

## 2011-04-06 DIAGNOSIS — R059 Cough, unspecified: Secondary | ICD-10-CM

## 2011-04-06 DIAGNOSIS — J40 Bronchitis, not specified as acute or chronic: Secondary | ICD-10-CM

## 2011-04-06 LAB — POCT URINALYSIS DIPSTICK
Bilirubin, UA: NEGATIVE
Blood, UA: NEGATIVE
Glucose, UA: NEGATIVE
Ketones, UA: NEGATIVE
Leukocytes, UA: NEGATIVE
Nitrite, UA: NEGATIVE
Protein, UA: NEGATIVE
Spec Grav, UA: 1.02
Urobilinogen, UA: 0.2
pH, UA: 8.5

## 2011-04-06 MED ORDER — AZITHROMYCIN 250 MG PO TABS: ORAL_TABLET | ORAL | Status: AC
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
## 2011-04-06 NOTE — Patient Instructions (Signed)
Bronchitis Bronchitis is the body's way of reacting to injury and/or infection (inflammation) of the bronchi. Bronchi are the air tubes that extend from the windpipe into the lungs. If the inflammation becomes severe, it may cause shortness of breath. CAUSES  Inflammation may be caused by:  A virus.   Germs (bacteria).   Dust.   Allergens.   Pollutants and many other irritants.  The cells lining the bronchial tree are covered with tiny hairs (cilia). These constantly beat upward, away from the lungs, toward the mouth. This keeps the lungs free of pollutants. When these cells become too irritated and are unable to do their job, mucus begins to develop. This causes the characteristic cough of bronchitis. The cough clears the lungs when the cilia are unable to do their job. Without either of these protective mechanisms, the mucus would settle in the lungs. Then you would develop pneumonia. Smoking is a common cause of bronchitis and can contribute to pneumonia. Stopping this habit is the single most important thing you can do to help yourself. TREATMENT   Your caregiver may prescribe an antibiotic if the cough is caused by bacteria. Also, medicines that open up your airways make it easier to breathe. Your caregiver may also recommend or prescribe an expectorant. It will loosen the mucus to be coughed up. Only take over-the-counter or prescription medicines for pain, discomfort, or fever as directed by your caregiver.   Removing whatever causes the problem (smoking, for example) is critical to preventing the problem from getting worse.   Cough suppressants may be prescribed for relief of cough symptoms.   Inhaled medicines may be prescribed to help with symptoms now and to help prevent problems from returning.   For those with recurrent (chronic) bronchitis, there may be a need for steroid medicines.  SEEK IMMEDIATE MEDICAL CARE IF:   During treatment, you develop more pus-like mucus  (purulent sputum).   You have a fever.   Your baby is older than 3 months with a rectal temperature of 102 F (38.9 C) or higher.   Your baby is 3 months old or younger with a rectal temperature of 100.4 F (38 C) or higher.   You become progressively more ill.   You have increased difficulty breathing, wheezing, or shortness of breath.  It is necessary to seek immediate medical care if you are elderly or sick from any other disease. MAKE SURE YOU:   Understand these instructions.   Will watch your condition.   Will get help right away if you are not doing well or get worse.  Document Released: 03/14/2005 Document Revised: 11/24/2010 Document Reviewed: 01/22/2008 Exit[employer] Patient Information 2012 [employer].  Smoking Cessation This document explains the best ways for you to quit smoking and new treatments to help. It lists new medicines that can double or triple your chances of quitting and quitting for good. It also considers ways to avoid relapses and concerns you may have about quitting, including weight gain. NICOTINE: A POWERFUL ADDICTION If you have tried to quit smoking, you know how hard it can be. It is hard because nicotine is a very addictive drug. For some people, it can be as addictive as heroin or cocaine. Usually, people make 2 or 3 tries, or more, before finally being able to quit. Each time you try to quit, you can learn about what helps and what hurts. Quitting takes hard work and a lot of effort, but you can quit smoking. QUITTING SMOKING IS ONE OF   THE MOST IMPORTANT THINGS YOU WILL EVER DO.  You will live longer, feel better, and live better.   The impact on your body of quitting smoking is felt almost immediately:   Within 20 minutes, blood pressure decreases. Pulse returns to its normal level.   After 8 hours, carbon monoxide levels in the blood return to normal. Oxygen level increases.   After 24 hours, chance of heart attack starts to decrease.  Breath, hair, and body stop smelling like smoke.   After 48 hours, damaged nerve endings begin to recover. Sense of taste and smell improve.   After 72 hours, the body is virtually free of nicotine. Bronchial tubes relax and breathing becomes easier.   After 2 to 12 weeks, lungs can hold more air. Exercise becomes easier and circulation improves.   Quitting will reduce your risk of having a heart attack, stroke, cancer, or lung disease:   After 1 year, the risk of coronary heart disease is cut in half.   After 5 years, the risk of stroke falls to the same as a nonsmoker.   After 10 years, the risk of lung cancer is cut in half and the risk of other cancers decreases significantly.   After 15 years, the risk of coronary heart disease drops, usually to the level of a nonsmoker.   If you are pregnant, quitting smoking will improve your chances of having a healthy baby.   The people you live with, especially your children, will be healthier.   You will have extra money to spend on things other than cigarettes.  FIVE KEYS TO QUITTING Studies have shown that these 5 steps will help you quit smoking and quit for good. You have the best chances of quitting if you use them together: 1. Get ready.  2. Get support and encouragement.  3. Learn new skills and behaviors.  4. Get medicine to reduce your nicotine addiction and use it correctly.  5. Be prepared for relapse or difficult situations. Be determined to continue trying to quit, even if you do not succeed at first.  1. GET READY  Set a quit date.   Change your environment.   Get rid of ALL cigarettes, ashtrays, matches, and lighters in your home, car, and place of work.   Do not let people smoke in your home.   Review your past attempts to quit. Think about what worked and what did not.   Once you quit, do not smoke. NOT EVEN A PUFF!  2. GET SUPPORT AND ENCOURAGEMENT Studies have shown that you have a better chance of being  successful if you have help. You can get support in many ways.  Tell your family, friends, and coworkers that you are going to quit and need their support. Ask them not to smoke around you.   Talk to your caregivers (doctor, dentist, nurse, pharmacist, psychologist, and/or smoking counselor).   Get individual, group, or telephone counseling and support. The more counseling you have, the better your chances are of quitting. Programs are available at local hospitals and health centers. Call your local health department for information about programs in your area.   Spiritual beliefs and practices may help some smokers quit.   Quit meters are small computer programs online or downloadable that keep track of quit statistics, such as amount of "quit-time," cigarettes not smoked, and money saved.   Many smokers find one or more of the many self-help books available useful in helping them quit and stay off   tobacco.  3. LEARN NEW SKILLS AND BEHAVIORS  Try to distract yourself from urges to smoke. Talk to someone, go for a walk, or occupy your time with a task.   When you first try to quit, change your routine. Take a different route to work. Drink tea instead of coffee. Eat breakfast in a different place.   Do something to reduce your stress. Take a hot bath, exercise, or read a book.   Plan something enjoyable to do every day. Reward yourself for not smoking.   Explore interactive web-based programs that specialize in helping you quit.  4. GET MEDICINE AND USE IT CORRECTLY Medicines can help you stop smoking and decrease the urge to smoke. Combining medicine with the above behavioral methods and support can quadruple your chances of successfully quitting smoking. The [place] Food and Drug Administration (FDA) has approved 7 medicines to help you quit smoking. These medicines fall into 3 categories.  Nicotine replacement therapy (delivers nicotine to your body without the negative effects and risks  of smoking):   Nicotine gum: Available over-the-counter.   Nicotine lozenges: Available over-the-counter.   Nicotine inhaler: Available by prescription.   Nicotine nasal spray: Available by prescription.   Nicotine skin patches (transdermal): Available by prescription and over-the-counter.   Antidepressant medicine (helps people abstain from smoking, but how this works is unknown):   Bupropion sustained-release (SR) tablets: Available by prescription.   Nicotinic receptor partial agonist (simulates the effect of nicotine in your brain):   Varenicline tartrate tablets: Available by prescription.   Ask your caregiver for advice about which medicines to use and how to use them. Carefully read the information on the package.   Everyone who is trying to quit may benefit from using a medicine. If you are pregnant or trying to become pregnant, nursing an infant, you are under age 18, or you smoke fewer than 10 cigarettes per day, talk to your caregiver before taking any nicotine replacement medicines.   You should stop using a nicotine replacement product and call your caregiver if you experience nausea, dizziness, weakness, vomiting, fast or irregular heartbeat, mouth problems with the lozenge or gum, or redness or swelling of the skin around the patch that does not go away.   Do not use any other product containing nicotine while using a nicotine replacement product.   Talk to your caregiver before using these products if you have diabetes, heart disease, asthma, stomach ulcers, you had a recent heart attack, you have high blood pressure that is not controlled with medicine, a history of irregular heartbeat, or you have been prescribed medicine to help you quit smoking.  5. BE PREPARED FOR RELAPSE OR DIFFICULT SITUATIONS  Most relapses occur within the first 3 months after quitting. Do not be discouraged if you start smoking again. Remember, most people try several times before they finally  quit.   You may have symptoms of withdrawal because your body is used to nicotine. You may crave cigarettes, be irritable, feel very hungry, cough often, get headaches, or have difficulty concentrating.   The withdrawal symptoms are only temporary. They are strongest when you first quit, but they will go away within 10 to 14 days.  Here are some difficult situations to watch for:  Alcohol. Avoid drinking alcohol. Drinking lowers your chances of successfully quitting.   Caffeine. Try to reduce the amount of caffeine you consume. It also lowers your chances of successfully quitting.   Other smokers. Being around smoking can make you   want to smoke. Avoid smokers.   Weight gain. Many smokers will gain weight when they quit, usually less than 10 pounds. Eat a healthy diet and stay active. Do not let weight gain distract you from your main goal, quitting smoking. Some medicines that help you quit smoking may also help delay weight gain. You can always lose the weight gained after you quit.   Bad mood or depression. There are a lot of ways to improve your mood other than smoking.  If you are having problems with any of these situations, talk to your caregiver. SPECIAL SITUATIONS AND CONDITIONS Studies suggest that everyone can quit smoking. Your situation or condition can give you a special reason to quit.  Pregnant women/new mothers: By quitting, you protect your baby's health and your own.   Hospitalized patients: By quitting, you reduce health problems and help healing.   Heart attack patients: By quitting, you reduce your risk of a second heart attack.   Lung, head, and neck cancer patients: By quitting, you reduce your chance of a second cancer.   Parents of children and adolescents: By quitting, you protect your children from illnesses caused by secondhand smoke.  QUESTIONS TO THINK ABOUT Think about the following questions before you try to stop smoking. You may want to talk about your  answers with your caregiver.  Why do you want to quit?   If you tried to quit in the past, what helped and what did not?   What will be the most difficult situations for you after you quit? How will you plan to handle them?   Who can help you through the tough times? Your family? Friends? Caregiver?   What pleasures do you get from smoking? What ways can you still get pleasure if you quit?  Here are some questions to ask your caregiver:  How can you help me to be successful at quitting?   What medicine do you think would be best for me and how should I take it?   What should I do if I need more help?   What is smoking withdrawal like? How can I get information on withdrawal?  Quitting takes hard work and a lot of effort, but you can quit smoking. FOR MORE INFORMATION  Smokefree.gov (http://www.smokefree.gov) provides free, accurate, evidence-based information and professional assistance to help support the immediate and long-term needs of people trying to quit smoking. Document Released: 03/08/2001 Document Revised: 11/24/2010 Document Reviewed: 12/29/2008 [employer] Patient Information 2012 [employer]. 

## 2011-05-04 NOTE — ED Provider Notes (Signed)
History/physical exam/procedure(s) were performed by non-physician practitioner and as supervising physician I was immediately available for consultation/collaboration. I have reviewed all notes and am in agreement with care and plan.   Hilario Quarry, MD 05/04/11 1130

## 2011-05-13 ENCOUNTER — Other Ambulatory Visit: Payer: Self-pay

## 2011-05-13 ENCOUNTER — Encounter (HOSPITAL_COMMUNITY): Payer: Self-pay | Admitting: Neurology

## 2011-05-13 ENCOUNTER — Emergency Department (HOSPITAL_COMMUNITY): Payer: BC Managed Care – PPO

## 2011-05-13 ENCOUNTER — Emergency Department (HOSPITAL_COMMUNITY)
Admission: EM | Admit: 2011-05-13 | Discharge: 2011-05-13 | Disposition: A | Discharge status: Home / Self Care | Payer: BC Managed Care – PPO | Attending: Emergency Medicine | Admitting: Emergency Medicine

## 2011-05-13 DIAGNOSIS — R079 Chest pain, unspecified: Secondary | ICD-10-CM | POA: Insufficient documentation

## 2011-05-13 DIAGNOSIS — I10 Essential (primary) hypertension: Secondary | ICD-10-CM | POA: Insufficient documentation

## 2011-05-13 DIAGNOSIS — Z79899 Other long term (current) drug therapy: Secondary | ICD-10-CM | POA: Insufficient documentation

## 2011-05-13 DIAGNOSIS — J45909 Unspecified asthma, uncomplicated: Secondary | ICD-10-CM | POA: Insufficient documentation

## 2011-05-13 DIAGNOSIS — R0602 Shortness of breath: Secondary | ICD-10-CM | POA: Insufficient documentation

## 2011-05-13 NOTE — ED Notes (Signed)
Per ems-reporting CP at 1100 today, central chest, denying any radiation. Reporting SOB. Pt showing tremors, reporting hx of anxiety. Pt was sleeping when CP occurred. 324 aspirin given by EMS, 2 SL Nitro-reduced pain to 6/10. 152/90, 90, 22 RR. Alert and oriented. NAD.
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
## 2011-06-02 ENCOUNTER — Emergency Department (HOSPITAL_COMMUNITY)
Admission: EM | Admit: 2011-06-02 | Discharge: 2011-06-03 | Disposition: A | Discharge status: Home / Self Care | Payer: BC Managed Care – PPO | Attending: Emergency Medicine | Admitting: Emergency Medicine

## 2011-06-02 ENCOUNTER — Encounter (HOSPITAL_COMMUNITY): Payer: Self-pay | Admitting: Emergency Medicine

## 2011-06-02 DIAGNOSIS — J45909 Unspecified asthma, uncomplicated: Secondary | ICD-10-CM | POA: Insufficient documentation

## 2011-06-02 DIAGNOSIS — I10 Essential (primary) hypertension: Secondary | ICD-10-CM | POA: Insufficient documentation

## 2011-06-02 DIAGNOSIS — F419 Anxiety disorder, unspecified: Secondary | ICD-10-CM

## 2011-06-02 DIAGNOSIS — F411 Generalized anxiety disorder: Secondary | ICD-10-CM | POA: Insufficient documentation

## 2011-06-02 DIAGNOSIS — F1010 Alcohol abuse, uncomplicated: Secondary | ICD-10-CM | POA: Insufficient documentation

## 2011-06-02 DIAGNOSIS — F17200 Nicotine dependence, unspecified, uncomplicated: Secondary | ICD-10-CM | POA: Insufficient documentation

## 2011-06-02 DIAGNOSIS — F1910 Other psychoactive substance abuse, uncomplicated: Secondary | ICD-10-CM | POA: Insufficient documentation

## 2011-06-02 LAB — BASIC METABOLIC PANEL
BUN: 10 mg/dL (ref 6–23)
CO2: 23 mEq/L (ref 19–32)
Calcium: 9.9 mg/dL (ref 8.4–10.5)
Chloride: 101 mEq/L (ref 96–112)
Creatinine, Ser: 0.8 mg/dL (ref 0.50–1.35)
GFR calc Af Amer: >90 mL/min (ref >90)
GFR calc non Af Amer: >90 mL/min (ref >90)
Glucose, Bld: 110 mg/dL — ABNORMAL HIGH (ref 70–99)
Potassium: 3.8 mEq/L (ref 3.5–5.1)
Sodium: 140 mEq/L (ref 135–145)

## 2011-06-02 LAB — CBC
HCT: 44.9 % (ref 39.0–52.0)
Hemoglobin: 16.2 g/dL (ref 13.0–17.0)
MCH: 33.1 pg (ref 26.0–34.0)
MCHC: 36.1 g/dL — ABNORMAL HIGH (ref 30.0–36.0)
MCV: 91.8 fL (ref 78.0–100.0)
Platelets: 333 10*3/uL (ref 150–400)
RBC: 4.89 MIL/uL (ref 4.22–5.81)
RDW: 12.3 % (ref 11.5–15.5)
WBC: 10 10*3/uL (ref 4.0–10.5)

## 2011-06-02 LAB — RAPID URINE DRUG SCREEN, HOSP PERFORMED
Amphetamines: NOT DETECTED
Barbiturates: NOT DETECTED
Benzodiazepines: NOT DETECTED
Cocaine: POSITIVE — AB
Opiates: NOT DETECTED
Tetrahydrocannabinol: POSITIVE — AB

## 2011-06-02 LAB — DIFFERENTIAL
Basophils Absolute: 0.1 10*3/uL (ref 0.0–0.1)
Basophils Relative: 1 % (ref 0–1)
Eosinophils Absolute: 0 10*3/uL (ref 0.0–0.7)
Eosinophils Relative: 0 % (ref 0–5)
Lymphocytes Relative: 39 % (ref 12–46)
Lymphs Abs: 3.9 10*3/uL (ref 0.7–4.0)
Monocytes Absolute: 0.6 10*3/uL (ref 0.1–1.0)
Monocytes Relative: 6 % (ref 3–12)
Neutro Abs: 5.4 10*3/uL (ref 1.7–7.7)
Neutrophils Relative %: 54 % (ref 43–77)

## 2011-06-02 LAB — ETHANOL: Alcohol, Ethyl (B): 189 mg/dL — ABNORMAL HIGH (ref 0–11)

## 2011-06-02 NOTE — ED Notes (Signed)
Belongings , shirt, pants, tennis shoes, under garments, coat, bracelet and cell phone.  MM

## 2011-06-02 NOTE — ED Notes (Signed)
Pt presented to the ER in the company of the [employer] and parents, pt at this time denies SI ideation, states his words were misinterpreted, however, when asked what he said, pt smiled ans said " i cannot remember" . According mother pt sated he "wanted to commit a suicidef", at this time pt did not elaborate on his plan, denies that he said such a statement. [employer] states that pt was given a choice [facility] to [facility] or [facility]"

## 2011-06-02 NOTE — ED Notes (Signed)
Mother - Bryden Darden [phone]
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
## 2011-06-03 MED ORDER — ACETAMINOPHEN 325 MG PO TABS
650.0000 mg | ORAL_TABLET | Freq: Once | ORAL | Status: AC
  Administered 2011-06-03: 650 mg via ORAL

## 2011-06-03 MED ORDER — ACETAMINOPHEN 325 MG PO TABS
ORAL_TABLET | ORAL | Status: AC
  Filled 2011-06-03: qty 2
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
## 2011-06-20 ENCOUNTER — Ambulatory Visit (INDEPENDENT_AMBULATORY_CARE_PROVIDER_SITE_OTHER): Payer: BC Managed Care – PPO | Admitting: Family Medicine

## 2011-06-20 ENCOUNTER — Encounter: Payer: Self-pay | Admitting: Family Medicine

## 2011-06-20 VITALS — BP 130/80 | Temp 97.6°F | Wt 220.0 lb

## 2011-06-20 DIAGNOSIS — F17200 Nicotine dependence, unspecified, uncomplicated: Secondary | ICD-10-CM

## 2011-06-20 DIAGNOSIS — J069 Acute upper respiratory infection, unspecified: Secondary | ICD-10-CM

## 2011-06-20 DIAGNOSIS — J45909 Unspecified asthma, uncomplicated: Secondary | ICD-10-CM

## 2011-06-20 MED ORDER — PREDNISONE 20 MG PO TABS: ORAL_TABLET | ORAL | Status: DC

## 2011-06-20 MED ORDER — HYDROCODONE-HOMATROPINE 5-1.5 MG/5ML PO SYRP: ORAL_SOLUTION | ORAL | Status: DC

## 2011-06-20 NOTE — Patient Instructions (Signed)
Try the nicotine patches for a couple weeks and not smoking  One other option would be to use the nicotine gum  Drink lots of water  Hydromet 1/2-1 teaspoon at bedtime when necessary for cough  Prednisone as directed for the wheezing  Return when necessary

## 2011-06-20 NOTE — Progress Notes (Signed)
  Subjective:    Patient ID: Ryan [name], male    DOB: July 26, 1985, 26 y.o.   MRN: [id number]  HPI Ryan [name] is a 26 year old single male smoker with an underlying history of alcoholism and asthma and hypertension who comes in with a three-day history of head congestion postnasal drip cough and a flare of his wheezing no fever  He smokes a half a plus pack of cigarettes a day. We tried him on the Chantix program every had nightmares even on small doses.    Review of Systems General and pulmonary review of systems otherwise negative    Objective:   Physical Exam Well-developed well-nourished male in no acute distress HEENT negative neck was supple no adenopathy lungs are clear except for mild late expiratory wheezing       Assessment & Plan:  Viral syndrome on top of underlying allergic rhinitis and asthma and tobacco abuse  Plan drink lots of liquids try the nicotine patches Hydromet each bedtime prednisone burst and taper for wheezing

## 2011-12-19 ENCOUNTER — Telehealth: Payer: Self-pay | Admitting: Family Medicine

## 2011-12-19 ENCOUNTER — Encounter: Payer: Self-pay | Admitting: Family Medicine

## 2011-12-19 ENCOUNTER — Ambulatory Visit (INDEPENDENT_AMBULATORY_CARE_PROVIDER_SITE_OTHER): Payer: BC Managed Care – PPO | Admitting: Family Medicine

## 2011-12-19 VITALS — BP 130/90 | Temp 97.4°F | Wt 229.0 lb

## 2011-12-19 DIAGNOSIS — H5711 Ocular pain, right eye: Secondary | ICD-10-CM

## 2011-12-19 DIAGNOSIS — B360 Pityriasis versicolor: Secondary | ICD-10-CM

## 2011-12-19 DIAGNOSIS — H5710 Ocular pain, unspecified eye: Secondary | ICD-10-CM

## 2011-12-19 DIAGNOSIS — Z23 Encounter for immunization: Secondary | ICD-10-CM

## 2011-12-19 DIAGNOSIS — Z1159 Encounter for screening for other viral diseases: Secondary | ICD-10-CM

## 2011-12-19 MED ORDER — KETOCONAZOLE 200 MG PO TABS: 200.0000 mg | ORAL_TABLET | Freq: Every day | ORAL | Status: DC

## 2011-12-19 NOTE — Progress Notes (Signed)
  Subjective:    Patient ID: Ryan [name], male    DOB: 09/28/1985, 26 y.o.   MRN: [id number]  HPI Ryan [name] is a 26 year old male smoker,,,,,,,,,, but is not drinking anymore and,,,,,,,,,,, who comes in today accompanied by his girlfriend for evaluation of 2 problems  He is a history of tinea versicolor and has been using antifungal creams however it's not improving and seems to be spreading. It mainly involves his back.  Since Friday he's had pain in the lateral side of his right eye. No history of trauma he states his vision is normal  His girlfriend has a history of hepatitis C    Review of Systems General and metabolic ophthalmologic review of systems all negative    Objective:   Physical Exam  Well-developed well-nourished male in no acute distress HEENT negative visual acuity normal  Skin exam shows tinea versicolor involving the entire back in 3 spots on his anterior abdominal wall      Assessment & Plan:

## 2011-12-19 NOTE — Telephone Encounter (Signed)
Please call patient on his cell phone NOT home phone once blood work comes back in. Thanks

## 2011-12-19 NOTE — Telephone Encounter (Signed)
noted 

## 2011-12-19 NOTE — Patient Instructions (Signed)
Use small amounts of the antifungal cream twice daily  Ketoconazole one tablet daily in the morning  Call Dr. Mia Creek phone number [phone] for evaluation of the pain in your right eye  Lab work today

## 2011-12-20 LAB — HEPATIC FUNCTION PANEL
ALT: 38 U/L (ref 0–53)
AST: 21 U/L (ref 0–37)
Albumin: 4.4 g/dL (ref 3.5–5.2)
Alkaline Phosphatase: 93 U/L (ref 39–117)
Bilirubin, Direct: 0.1 mg/dL (ref 0.0–0.3)
Total Bilirubin: 0.5 mg/dL (ref 0.3–1.2)
Total Protein: 7.6 g/dL (ref 6.0–8.3)

## 2011-12-20 LAB — HEPATITIS C ANTIBODY: HCV Ab: NEGATIVE

## 2012-02-03 ENCOUNTER — Other Ambulatory Visit: Payer: Self-pay | Admitting: Family Medicine

## 2012-02-14 ENCOUNTER — Other Ambulatory Visit: Payer: Self-pay | Admitting: Family Medicine

## 2012-05-01 ENCOUNTER — Encounter: Payer: Self-pay | Admitting: Family Medicine

## 2012-05-01 ENCOUNTER — Ambulatory Visit (INDEPENDENT_AMBULATORY_CARE_PROVIDER_SITE_OTHER): Payer: BC Managed Care – PPO | Admitting: Family Medicine

## 2012-05-01 VITALS — BP 120/80 | Temp 97.8°F | Wt 230.0 lb

## 2012-05-01 DIAGNOSIS — J45909 Unspecified asthma, uncomplicated: Secondary | ICD-10-CM

## 2012-05-01 DIAGNOSIS — J069 Acute upper respiratory infection, unspecified: Secondary | ICD-10-CM

## 2012-05-01 MED ORDER — HYDROCODONE-HOMATROPINE 5-1.5 MG/5ML PO SYRP: 5.0000 mL | ORAL_SOLUTION | Freq: Three times a day (TID) | ORAL | Status: DC | PRN

## 2012-05-01 MED ORDER — PREDNISONE 20 MG PO TABS: ORAL_TABLET | ORAL | Status: DC

## 2012-05-01 NOTE — Progress Notes (Signed)
  Subjective:    Patient ID: Ryan [name], male    DOB: 07-30-1985, 27 y.o.   MRN: [id number]  HPI Ryan [name] is a 27 year old single male X. smoker x9 months who comes in with a five-day history of head congestion sore throat cough and wheezing  He's had a history of asthma and takes Qvar 40 dose 2 puffs twice a day on a daily basis  5 days ago he developed head congestion sore throat chills and coughing. Last night the wheezing seemed to be getting worse.   Review of Systems    general and pulmonary review of systems otherwise negative Objective:   Physical Exam  Well-developed well-nourished male in no acute distress HEENT negative neck was supple no adenopathy lungs show symmetrical breath sounds but bilateral wheezing mild to moderate      Assessment & Plan:  Viral syndrome with secondary asthma plan prednisone and Hydromet

## 2012-05-01 NOTE — Patient Instructions (Signed)
Drink lots of water  Hydromet 1/2-1 teaspoon 3 times daily. For cough  Rest at home for 3 days  Prednisone as directed  Return when necessary

## 2012-05-03 ENCOUNTER — Encounter: Payer: Self-pay | Admitting: *Deleted

## 2012-05-04 ENCOUNTER — Telehealth: Payer: Self-pay | Admitting: *Deleted

## 2012-05-04 NOTE — Telephone Encounter (Signed)
Patient is calling requesting a refill of Hydromet.

## 2012-05-07 ENCOUNTER — Telehealth: Payer: Self-pay | Admitting: Family Medicine

## 2012-05-07 NOTE — Telephone Encounter (Signed)
Patient Information:  Caller Name: Efraim Kaufmann  Phone: [phone]  Patient: Ryan [name], Ryan [name]  Gender: Male  DOB: 1985-06-14  Age: 27 Years  PCP: Kelle Darting [facility]  Office Follow Up:  Does the office need to follow up with this patient?: Yes  Instructions For The Office: See notes   Symptoms  Reason For Call & Symptoms: Patient reports lung infection and fluid in ears. Received Prednisone and cough suppressant.  Frequent cough, difficulty hearing from right ear.  Continues to have cold sweats at night and cough wakes from sleep.  Occasional chest pain when not coughing and wheezing is present.  Reviewed Health History In EMR: Yes  Reviewed Medications In EMR: Yes  Reviewed Allergies In EMR: Yes  Reviewed Surgeries / Procedures: Yes  Date of Onset of Symptoms: 04/30/2012  Treatments Tried: Prednisone, Cough syrup  Treatments Tried Worked: Yes  Guideline(s) Used:  Cough  Disposition Per Guideline:   Go to Office Now  Reason For Disposition Reached:   Wheezing is present  Advice Given:  Reassurance  Coughing is the way that our lungs remove irritants and mucus. It helps protect our lungs from getting pneumonia.  Here is some care advice that should help.  Cough Medicines:  OTC Cough Drops: Cough drops can help a lot, especially for mild coughs. They reduce coughing by soothing your irritated throat and removing that tickle sensation in the back of the throat. Cough drops also have the advantage of portability - you can carry them with you.  Home Remedy - Hard Candy: Hard candy works just as well as medicine-flavored OTC cough drops. Diabetics should use sugar-free candy.  Coughing Spasms:  Drink warm fluids. Inhale warm mist (Reason: both relax the airway and loosen up the phlegm).  Suck on cough drops or hard candy to coat the irritated throat.  Prevent Dehydration:  Drink adequate liquids.  This will help soothe an irritated or dry throat and loosen up the  phlegm.  Patient Refused Recommendation:  Patient Refused Appt, Patient Requests Appt At Later Date  Wants appointment for 05/08/12-  states no transportation. Was advised to go to [facility].

## 2012-05-07 NOTE — Telephone Encounter (Signed)
Fleet Contras please call,,,,,,,, he was given a prescription week ago with a refill???????????

## 2012-05-13 ENCOUNTER — Encounter (HOSPITAL_COMMUNITY): Payer: Self-pay

## 2012-05-13 ENCOUNTER — Emergency Department (HOSPITAL_COMMUNITY): Payer: BC Managed Care – PPO

## 2012-05-13 ENCOUNTER — Emergency Department (HOSPITAL_COMMUNITY)
Admission: EM | Admit: 2012-05-13 | Discharge: 2012-05-13 | Disposition: A | Discharge status: Home / Self Care | Payer: BC Managed Care – PPO | Attending: Emergency Medicine | Admitting: Emergency Medicine

## 2012-05-13 ENCOUNTER — Telehealth (HOSPITAL_COMMUNITY): Payer: Self-pay | Admitting: Emergency Medicine

## 2012-05-13 DIAGNOSIS — Y9329 Activity, other involving ice and snow: Secondary | ICD-10-CM | POA: Insufficient documentation

## 2012-05-13 DIAGNOSIS — Y929 Unspecified place or not applicable: Secondary | ICD-10-CM | POA: Insufficient documentation

## 2012-05-13 DIAGNOSIS — J45909 Unspecified asthma, uncomplicated: Secondary | ICD-10-CM | POA: Insufficient documentation

## 2012-05-13 DIAGNOSIS — F17200 Nicotine dependence, unspecified, uncomplicated: Secondary | ICD-10-CM | POA: Insufficient documentation

## 2012-05-13 DIAGNOSIS — IMO0002 Reserved for concepts with insufficient information to code with codable children: Secondary | ICD-10-CM | POA: Insufficient documentation

## 2012-05-13 DIAGNOSIS — S6990XA Unspecified injury of unspecified wrist, hand and finger(s), initial encounter: Secondary | ICD-10-CM | POA: Insufficient documentation

## 2012-05-13 DIAGNOSIS — Z8711 Personal history of peptic ulcer disease: Secondary | ICD-10-CM | POA: Insufficient documentation

## 2012-05-13 DIAGNOSIS — W19XXXA Unspecified fall, initial encounter: Secondary | ICD-10-CM

## 2012-05-13 DIAGNOSIS — W010XXA Fall on same level from slipping, tripping and stumbling without subsequent striking against object, initial encounter: Secondary | ICD-10-CM | POA: Insufficient documentation

## 2012-05-13 DIAGNOSIS — S060X1A Concussion with loss of consciousness of 30 minutes or less, initial encounter: Secondary | ICD-10-CM | POA: Insufficient documentation

## 2012-05-13 DIAGNOSIS — Z8781 Personal history of (healed) traumatic fracture: Secondary | ICD-10-CM | POA: Insufficient documentation

## 2012-05-13 DIAGNOSIS — Z79899 Other long term (current) drug therapy: Secondary | ICD-10-CM | POA: Insufficient documentation

## 2012-05-13 DIAGNOSIS — S59909A Unspecified injury of unspecified elbow, initial encounter: Secondary | ICD-10-CM | POA: Insufficient documentation

## 2012-05-13 DIAGNOSIS — I10 Essential (primary) hypertension: Secondary | ICD-10-CM | POA: Insufficient documentation

## 2012-05-13 DIAGNOSIS — F1010 Alcohol abuse, uncomplicated: Secondary | ICD-10-CM | POA: Insufficient documentation

## 2012-05-13 DIAGNOSIS — M549 Dorsalgia, unspecified: Secondary | ICD-10-CM

## 2012-05-13 LAB — CBC WITH DIFFERENTIAL/PLATELET
Basophils Absolute: 0.1 10*3/uL (ref 0.0–0.1)
Basophils Relative: 0 % (ref 0–1)
Eosinophils Absolute: 0 10*3/uL (ref 0.0–0.7)
Eosinophils Relative: 0 % (ref 0–5)
HCT: 45.9 % (ref 39.0–52.0)
Hemoglobin: 16.1 g/dL (ref 13.0–17.0)
Lymphocytes Relative: 28 % (ref 12–46)
Lymphs Abs: 4.1 10*3/uL — ABNORMAL HIGH (ref 0.7–4.0)
MCH: 31.6 pg (ref 26.0–34.0)
MCHC: 35.1 g/dL (ref 30.0–36.0)
MCV: 90.2 fL (ref 78.0–100.0)
Monocytes Absolute: 1.3 10*3/uL — ABNORMAL HIGH (ref 0.1–1.0)
Monocytes Relative: 9 % (ref 3–12)
Neutro Abs: 9.4 10*3/uL — ABNORMAL HIGH (ref 1.7–7.7)
Neutrophils Relative %: 64 % (ref 43–77)
Platelets: 255 10*3/uL (ref 150–400)
RBC: 5.09 MIL/uL (ref 4.22–5.81)
RDW: 12.8 % (ref 11.5–15.5)
WBC: 14.8 10*3/uL — ABNORMAL HIGH (ref 4.0–10.5)

## 2012-05-13 LAB — BASIC METABOLIC PANEL
BUN: 12 mg/dL (ref 6–23)
CO2: 24 mEq/L (ref 19–32)
Calcium: 9.4 mg/dL (ref 8.4–10.5)
Chloride: 98 mEq/L (ref 96–112)
Creatinine, Ser: 0.81 mg/dL (ref 0.50–1.35)
GFR calc Af Amer: >90 mL/min (ref >90)
GFR calc non Af Amer: >90 mL/min (ref >90)
Glucose, Bld: 104 mg/dL — ABNORMAL HIGH (ref 70–99)
Potassium: 4 mEq/L (ref 3.5–5.1)
Sodium: 133 mEq/L — ABNORMAL LOW (ref 135–145)

## 2012-05-13 LAB — POCT I-STAT, CHEM 8
BUN: 12 mg/dL (ref 6–23)
Calcium, Ion: 1.12 mmol/L (ref 1.12–1.23)
Chloride: 105 mEq/L (ref 96–112)
Creatinine, Ser: 0.9 mg/dL (ref 0.50–1.35)
Glucose, Bld: 104 mg/dL — ABNORMAL HIGH (ref 70–99)
HCT: 47 % (ref 39.0–52.0)
Hemoglobin: 16 g/dL (ref 13.0–17.0)
Potassium: 4 mEq/L (ref 3.5–5.1)
Sodium: 140 mEq/L (ref 135–145)
TCO2: 26 mmol/L (ref 0–100)

## 2012-05-13 MED ORDER — NAPROXEN 500 MG PO TABS: 500.0000 mg | ORAL_TABLET | Freq: Two times a day (BID) | ORAL | Status: DC

## 2012-05-13 MED ORDER — ONDANSETRON 4 MG PO TBDP
4.0000 mg | ORAL_TABLET | Freq: Once | ORAL | Status: AC
  Administered 2012-05-13: 4 mg via ORAL
  Filled 2012-05-13: qty 1

## 2012-05-13 MED ORDER — PERCOCET 5-325 MG PO TABS: 1.0000 | ORAL_TABLET | Freq: Four times a day (QID) | ORAL | Status: DC | PRN

## 2012-05-13 MED ORDER — KETOROLAC TROMETHAMINE 30 MG/ML IJ SOLN
30.0000 mg | Freq: Once | INTRAMUSCULAR | Status: AC
  Administered 2012-05-13: 30 mg via INTRAVENOUS
  Filled 2012-05-13: qty 1

## 2012-05-13 MED ORDER — HYDROMORPHONE HCL PF 1 MG/ML IJ SOLN
1.0000 mg | Freq: Once | INTRAMUSCULAR | Status: AC
  Administered 2012-05-13: 1 mg via INTRAVENOUS
  Filled 2012-05-13: qty 1

## 2012-05-13 MED ORDER — DIAZEPAM 5 MG/ML IJ SOLN
5.0000 mg | Freq: Once | INTRAMUSCULAR | Status: AC
  Administered 2012-05-13: 5 mg via INTRAVENOUS
  Filled 2012-05-13: qty 2

## 2012-05-13 NOTE — ED Notes (Signed)
Patient calling to say he was told he would receive a neck brace.
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
## 2012-05-13 NOTE — ED Provider Notes (Signed)
Medical screening examination/treatment/procedure(s) were performed by non-physician practitioner and as supervising physician I was immediately available for consultation/collaboration.   Lyanne Co, MD 05/13/12 [phone]
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
## 2012-05-14 ENCOUNTER — Ambulatory Visit (INDEPENDENT_AMBULATORY_CARE_PROVIDER_SITE_OTHER): Payer: BC Managed Care – PPO | Admitting: Family Medicine

## 2012-05-14 ENCOUNTER — Telehealth: Payer: Self-pay | Admitting: Family Medicine

## 2012-05-14 ENCOUNTER — Encounter: Payer: Self-pay | Admitting: Family Medicine

## 2012-05-14 VITALS — BP 160/100 | Temp 97.7°F | Wt 232.0 lb

## 2012-05-14 DIAGNOSIS — M549 Dorsalgia, unspecified: Secondary | ICD-10-CM

## 2012-05-14 MED ORDER — HYDROCODONE-ACETAMINOPHEN 7.5-750 MG PO TABS: ORAL_TABLET | ORAL | Status: DC

## 2012-05-14 MED ORDER — DIAZEPAM 5 MG PO TABS: ORAL_TABLET | ORAL | Status: DC

## 2012-05-14 NOTE — Progress Notes (Signed)
  Subjective:    Patient ID: Ryan [name], male    DOB: 1985-10-22, 27 y.o.   MRN: [id number]  HPI Ryan [name] is a 27 year old single male who comes in today for evaluation of neck and back pain  He states this past Saturday, February 15 he slipped on black ice going into [employer]. His father check in the emergency room. Numerous x-rays including CT brain scan were all normal. He's had a history of scoliosis lumbar spine in the past and has been seen by Dr. Glee Arvin,, neurosurgeon. No surgery has been performed. He's had physical therapy in the past for low back pain  His occupation as a Production assistant, radio  He states he's taking Naprosyn 500 mg twice a day and Percocet 5 mg every 6 hours he still has severe pain which he categorizes as a 7 to an 8. He states his in his neck and back and shoots down his legs.   Review of Systems    review of systems otherwise negative Objective:   Physical Exam  Well-developed and nourished male no acute distress examination of spine shows palpable tenderness from the neck to the coccyx. In the supine position sensation muscle strength reflexes are within normal limits he complains of numbness left anterior thigh      Assessment & Plan:  Contusion neck and spine plan rest at home Valium and Vicodin 3 times daily control pain begin physical therapy ASAP
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
## 2012-05-14 NOTE — Patient Instructions (Signed)
Flexeril and Vicodin,,,,,,,,,,, one half to one of each 3 times daily for neck and back pain  We will get you set up for physical therapy ASAP  Return in one week for followup  Rest at home

## 2012-05-16 ENCOUNTER — Ambulatory Visit: Payer: BC Managed Care – PPO | Attending: Family Medicine | Admitting: Physical Therapy

## 2012-05-16 DIAGNOSIS — M2560 Stiffness of unspecified joint, not elsewhere classified: Secondary | ICD-10-CM | POA: Insufficient documentation

## 2012-05-16 DIAGNOSIS — M542 Cervicalgia: Secondary | ICD-10-CM | POA: Insufficient documentation

## 2012-05-16 DIAGNOSIS — IMO0001 Reserved for inherently not codable concepts without codable children: Secondary | ICD-10-CM | POA: Insufficient documentation

## 2012-05-16 DIAGNOSIS — M545 Low back pain, unspecified: Secondary | ICD-10-CM | POA: Insufficient documentation

## 2012-05-17 ENCOUNTER — Telehealth: Payer: Self-pay | Admitting: Family Medicine

## 2012-05-17 ENCOUNTER — Encounter: Payer: Self-pay | Admitting: *Deleted

## 2012-05-17 ENCOUNTER — Other Ambulatory Visit: Payer: Self-pay | Admitting: Family Medicine

## 2012-05-17 MED ORDER — TRAMADOL HCL 50 MG PO TABS
50.0000 mg | ORAL_TABLET | Freq: Three times a day (TID) | ORAL | Status: DC | PRN
Start: 2012-05-17 — End: 2012-06-27

## 2012-05-17 NOTE — Telephone Encounter (Signed)
Okay to give no back to work............ No we are not switching him to oxycodone!!!!!!!!!!!

## 2012-05-17 NOTE — Telephone Encounter (Signed)
Pt is request a work note to return to work today with restrictions no lifting. Please call patient when ready for pick up. Pt would like to switch hydrocodone to oxycodone.

## 2012-05-30 ENCOUNTER — Encounter: Payer: Self-pay | Admitting: Internal Medicine

## 2012-05-30 ENCOUNTER — Ambulatory Visit (INDEPENDENT_AMBULATORY_CARE_PROVIDER_SITE_OTHER): Payer: BC Managed Care – PPO | Admitting: Internal Medicine

## 2012-05-30 VITALS — BP 160/100 | HR 102 | Temp 97.7°F | Resp 20 | Wt 239.0 lb

## 2012-05-30 DIAGNOSIS — I10 Essential (primary) hypertension: Secondary | ICD-10-CM

## 2012-05-30 DIAGNOSIS — M549 Dorsalgia, unspecified: Secondary | ICD-10-CM

## 2012-05-30 MED ORDER — HYDROCODONE-ACETAMINOPHEN 10-325 MG PO TABS
1.0000 | ORAL_TABLET | Freq: Three times a day (TID) | ORAL | Status: DC | PRN
Start: 2012-05-30 — End: 2012-06-22

## 2012-05-30 MED ORDER — DIAZEPAM 5 MG PO TABS: ORAL_TABLET | ORAL | Status: DC

## 2012-05-30 NOTE — Patient Instructions (Addendum)
You  may move around, but avoid painful motions and activities.  Apply heat  to the sore area for 15 to 20 minutes 3 or 4 times daily for the next two to 3 days.  Call or return to clinic prn if these symptoms worsen or fail to improve as anticipated.  
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
## 2012-06-05 ENCOUNTER — Telehealth: Payer: Self-pay | Admitting: Internal Medicine

## 2012-06-05 NOTE — Telephone Encounter (Signed)
Pt wants to know if you will post date a script  for HYDROcodone-acetaminophen (NORCO) 10-325 MG per tablet. Dated for 2 weeks from now??? I advised pt it would be better to call us when it runs out, but pt wanted to do it this way. Pls advise.

## 2012-06-06 NOTE — Telephone Encounter (Signed)
Left message on voicemail. Please tell pt we can not post date a Rx needs to contact pharmacy when he needs a refill.

## 2012-06-06 NOTE — Telephone Encounter (Signed)
Left message on machine need to know how many pills pt has left? Has two Rx's for pain med.

## 2012-06-07 NOTE — Telephone Encounter (Signed)
Spoke to pt told him I am unable to refill Hydrocodone 10/325 mg because you still have refill left on Hydrocodone 7.5/325 mg and that the extra 10/325 mg was only as needed in between. Pt verbalized understanding. Asked pt about PT therapy? Pt stated went to one treatment and can not afford it. Pt said he can not get into Neurosurgeon for 2 months, will call for an appt if needs more pain meds. Told pt okay.

## 2012-06-08 ENCOUNTER — Telehealth: Payer: Self-pay | Admitting: Internal Medicine

## 2012-06-08 NOTE — Telephone Encounter (Signed)
Pt would like to know if there is something he can take that is stronger than 10 mg of hydrocodone that will allow him to work, yet not incapacitate him.  He had already been taking 2 ot the hydrocodone 5 mg each.     Pt asked about Oxycodone. Dr Tawanna Cooler patient

## 2012-06-11 NOTE — Telephone Encounter (Signed)
Left detailed message on pt's machine can not refill medication at present and can not give anything stronger was told that by Dr. Tawanna Cooler at last conversation.

## 2012-06-11 NOTE — Telephone Encounter (Signed)
PT HAS SWITCH PHARM TO [facility]/[address]

## 2012-06-12 ENCOUNTER — Telehealth: Payer: Self-pay | Admitting: *Deleted

## 2012-06-12 MED ORDER — NAPROXEN 500 MG PO TABS: 500.0000 mg | ORAL_TABLET | Freq: Two times a day (BID) | ORAL | Status: DC

## 2012-06-12 MED ORDER — LOSARTAN POTASSIUM 50 MG PO TABS: 50.0000 mg | ORAL_TABLET | Freq: Every day | ORAL | Status: DC

## 2012-06-12 NOTE — Telephone Encounter (Signed)
Patient has moved and would like to use [facility] at [address]

## 2012-06-22 ENCOUNTER — Ambulatory Visit: Payer: BC Managed Care – PPO | Admitting: Internal Medicine

## 2012-06-22 MED ORDER — HYDROCODONE-ACETAMINOPHEN 10-325 MG PO TABS: 1.0000 | ORAL_TABLET | Freq: Three times a day (TID) | ORAL | Status: DC | PRN

## 2012-06-22 NOTE — Telephone Encounter (Signed)
Okay #30. Suggest followup with Dr. Wynetta Emery his neurosurgeon and further pain medication needed

## 2012-06-22 NOTE — Telephone Encounter (Signed)
See message and advise of refill.

## 2012-06-22 NOTE — Telephone Encounter (Signed)
Spoke to pt told him Rx was called into pharmacy and need to follow up with Dr. Wynetta Emery Neurosurgeon and further pain medication needed. Pt.verbalized understanding.

## 2012-06-22 NOTE — Telephone Encounter (Signed)
Pt missed his 8:15 appt with you because he said he overslept. York Spaniel he has to work and cannot reschedule.  Pt then asked if you could refill the HYDROcodone-acetaminophen (NORCO) 10-325 MG per tablet. Pt says the 7.5 are not working at all.  He has been taking 2 of them each time, and the 10-325mg  is what works best.  Pls advise

## 2012-06-26 ENCOUNTER — Encounter (HOSPITAL_COMMUNITY): Payer: Self-pay

## 2012-06-26 ENCOUNTER — Other Ambulatory Visit: Payer: Self-pay | Admitting: Family Medicine

## 2012-06-26 ENCOUNTER — Emergency Department (HOSPITAL_COMMUNITY)
Admission: EM | Admit: 2012-06-26 | Discharge: 2012-06-27 | Disposition: A | Discharge status: Home / Self Care | Payer: BC Managed Care – PPO | Attending: Emergency Medicine | Admitting: Emergency Medicine

## 2012-06-26 ENCOUNTER — Telehealth: Payer: Self-pay | Admitting: Family Medicine

## 2012-06-26 DIAGNOSIS — M545 Low back pain, unspecified: Secondary | ICD-10-CM | POA: Insufficient documentation

## 2012-06-26 DIAGNOSIS — J45909 Unspecified asthma, uncomplicated: Secondary | ICD-10-CM | POA: Insufficient documentation

## 2012-06-26 DIAGNOSIS — G8929 Other chronic pain: Secondary | ICD-10-CM | POA: Insufficient documentation

## 2012-06-26 DIAGNOSIS — M549 Dorsalgia, unspecified: Secondary | ICD-10-CM

## 2012-06-26 DIAGNOSIS — F17200 Nicotine dependence, unspecified, uncomplicated: Secondary | ICD-10-CM | POA: Insufficient documentation

## 2012-06-26 DIAGNOSIS — I10 Essential (primary) hypertension: Secondary | ICD-10-CM | POA: Insufficient documentation

## 2012-06-26 DIAGNOSIS — Z8719 Personal history of other diseases of the digestive system: Secondary | ICD-10-CM | POA: Insufficient documentation

## 2012-06-26 DIAGNOSIS — F1010 Alcohol abuse, uncomplicated: Secondary | ICD-10-CM | POA: Insufficient documentation

## 2012-06-26 DIAGNOSIS — Z8781 Personal history of (healed) traumatic fracture: Secondary | ICD-10-CM | POA: Insufficient documentation

## 2012-06-26 DIAGNOSIS — Z87828 Personal history of other (healed) physical injury and trauma: Secondary | ICD-10-CM | POA: Insufficient documentation

## 2012-06-26 DIAGNOSIS — Z79899 Other long term (current) drug therapy: Secondary | ICD-10-CM | POA: Insufficient documentation

## 2012-06-26 NOTE — Telephone Encounter (Signed)
Pt needs referral to neurologist. Pt has pain going down both legs to shin now. Pt says it has been over 4 years since he has seen Dr Wynetta Emery and he is considered new patient. Pt does not care who it is, just so long as he gets in ASAP.
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
## 2012-06-26 NOTE — Telephone Encounter (Signed)
Referral made and patient is aware.

## 2012-06-27 ENCOUNTER — Emergency Department (HOSPITAL_COMMUNITY): Payer: BC Managed Care – PPO

## 2012-06-27 MED ORDER — DIAZEPAM 5 MG PO TABS
10.0000 mg | ORAL_TABLET | Freq: Once | ORAL | Status: AC
  Administered 2012-06-27: 10 mg via ORAL
  Filled 2012-06-27 (×2): qty 2

## 2012-06-27 MED ORDER — IBUPROFEN 800 MG PO TABS: 800.0000 mg | ORAL_TABLET | Freq: Three times a day (TID) | ORAL | Status: DC

## 2012-06-27 MED ORDER — HYDROMORPHONE HCL PF 1 MG/ML IJ SOLN
1.0000 mg | Freq: Once | INTRAMUSCULAR | Status: AC
  Administered 2012-06-27: 1 mg via INTRAMUSCULAR
  Filled 2012-06-27: qty 1

## 2012-06-27 MED ORDER — OXYCODONE-ACETAMINOPHEN 5-325 MG PO TABS: 1.0000 | ORAL_TABLET | ORAL | Status: DC | PRN

## 2012-06-27 MED ORDER — HYDROMORPHONE HCL PF 1 MG/ML IJ SOLN
1.0000 mg | Freq: Once | INTRAMUSCULAR | Status: AC
  Administered 2012-06-27: 1 mg via INTRAVENOUS
  Filled 2012-06-27: qty 1
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
## 2012-06-27 NOTE — ED Notes (Signed)
Patient transported to X-ray 

## 2012-06-27 NOTE — ED Notes (Signed)
C. SCHINLEVER PA EXPLAINED RESULTS OF TEST AND PLAN OF CARE .

## 2012-06-27 NOTE — ED Provider Notes (Signed)
Medical screening examination/treatment/procedure(s) were performed by non-physician practitioner and as supervising physician I was immediately available for consultation/collaboration.   Tramell Piechota L Jeremaih Klima, MD 06/27/12 0636 

## 2012-07-01 ENCOUNTER — Emergency Department (HOSPITAL_COMMUNITY)
Admission: EM | Admit: 2012-07-01 | Discharge: 2012-07-01 | Disposition: A | Discharge status: Home Health | Payer: BC Managed Care – PPO | Attending: Emergency Medicine | Admitting: Emergency Medicine

## 2012-07-01 ENCOUNTER — Encounter (HOSPITAL_COMMUNITY): Payer: Self-pay | Admitting: *Deleted

## 2012-07-01 DIAGNOSIS — I10 Essential (primary) hypertension: Secondary | ICD-10-CM | POA: Insufficient documentation

## 2012-07-01 DIAGNOSIS — Z79899 Other long term (current) drug therapy: Secondary | ICD-10-CM | POA: Insufficient documentation

## 2012-07-01 DIAGNOSIS — M545 Low back pain, unspecified: Secondary | ICD-10-CM | POA: Insufficient documentation

## 2012-07-01 DIAGNOSIS — Z872 Personal history of diseases of the skin and subcutaneous tissue: Secondary | ICD-10-CM | POA: Insufficient documentation

## 2012-07-01 DIAGNOSIS — J45909 Unspecified asthma, uncomplicated: Secondary | ICD-10-CM | POA: Insufficient documentation

## 2012-07-01 DIAGNOSIS — Z87828 Personal history of other (healed) physical injury and trauma: Secondary | ICD-10-CM | POA: Insufficient documentation

## 2012-07-01 DIAGNOSIS — Z8781 Personal history of (healed) traumatic fracture: Secondary | ICD-10-CM | POA: Insufficient documentation

## 2012-07-01 DIAGNOSIS — M549 Dorsalgia, unspecified: Secondary | ICD-10-CM

## 2012-07-01 DIAGNOSIS — F17200 Nicotine dependence, unspecified, uncomplicated: Secondary | ICD-10-CM | POA: Insufficient documentation

## 2012-07-01 MED ORDER — HYDROMORPHONE HCL PF 2 MG/ML IJ SOLN
2.0000 mg | Freq: Once | INTRAMUSCULAR | Status: AC
  Administered 2012-07-01: 2 mg via INTRAMUSCULAR
  Filled 2012-07-01: qty 1

## 2012-07-01 MED ORDER — DIAZEPAM 5 MG/ML IJ SOLN
2.5000 mg | Freq: Once | INTRAMUSCULAR | Status: AC
  Administered 2012-07-01: 2.5 mg via INTRAVENOUS
  Filled 2012-07-01: qty 2

## 2012-07-01 NOTE — ED Notes (Signed)
Pt requesting "10" pills until he can see his doctor on Monday
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
## 2012-07-04 ENCOUNTER — Other Ambulatory Visit: Payer: Self-pay | Admitting: Neurosurgery

## 2012-07-04 DIAGNOSIS — M5137 Other intervertebral disc degeneration, lumbosacral region: Secondary | ICD-10-CM

## 2012-07-04 DIAGNOSIS — IMO0002 Reserved for concepts with insufficient information to code with codable children: Secondary | ICD-10-CM

## 2012-07-04 DIAGNOSIS — M48061 Spinal stenosis, lumbar region without neurogenic claudication: Secondary | ICD-10-CM

## 2012-07-04 DIAGNOSIS — M4802 Spinal stenosis, cervical region: Secondary | ICD-10-CM

## 2012-07-04 DIAGNOSIS — M47817 Spondylosis without myelopathy or radiculopathy, lumbosacral region: Secondary | ICD-10-CM

## 2012-07-04 DIAGNOSIS — M51379 Other intervertebral disc degeneration, lumbosacral region without mention of lumbar back pain or lower extremity pain: Secondary | ICD-10-CM

## 2012-07-04 DIAGNOSIS — M5126 Other intervertebral disc displacement, lumbar region: Secondary | ICD-10-CM

## 2012-07-09 ENCOUNTER — Ambulatory Visit
Admission: RE | Admit: 2012-07-09 | Discharge: 2012-07-09 | Disposition: A | Discharge status: Home / Self Care | Payer: BC Managed Care – PPO | Source: Ambulatory Visit | Attending: Neurosurgery | Admitting: Neurosurgery

## 2012-07-09 DIAGNOSIS — M4802 Spinal stenosis, cervical region: Secondary | ICD-10-CM

## 2012-07-09 DIAGNOSIS — M47817 Spondylosis without myelopathy or radiculopathy, lumbosacral region: Secondary | ICD-10-CM

## 2012-07-09 DIAGNOSIS — IMO0002 Reserved for concepts with insufficient information to code with codable children: Secondary | ICD-10-CM

## 2012-07-09 DIAGNOSIS — M48061 Spinal stenosis, lumbar region without neurogenic claudication: Secondary | ICD-10-CM

## 2012-07-09 DIAGNOSIS — M5126 Other intervertebral disc displacement, lumbar region: Secondary | ICD-10-CM

## 2012-07-09 DIAGNOSIS — M5137 Other intervertebral disc degeneration, lumbosacral region: Secondary | ICD-10-CM

## 2012-07-30 ENCOUNTER — Other Ambulatory Visit: Payer: Self-pay | Admitting: *Deleted

## 2012-07-30 NOTE — Telephone Encounter (Signed)
[facility] on [address] is requesting a refill of diazepam for patient.  His last refill was 06/21/12.

## 2012-07-31 NOTE — Telephone Encounter (Signed)
Denied.  Diazepam was picked up from [facility] 07/19/12.

## 2012-08-01 ENCOUNTER — Telehealth: Payer: Self-pay | Admitting: Family Medicine

## 2012-08-01 NOTE — Telephone Encounter (Signed)
Pt has been referred to pain clinic. ( [facility]). Dr Wynetta Emery put him on 6 percocet's/day. Pain Clinic does not begin until May 19th. Pt would like to know if you would write him a RX for this or anything to help. Pls advise.

## 2012-08-02 ENCOUNTER — Encounter (HOSPITAL_COMMUNITY): Payer: Self-pay | Admitting: Emergency Medicine

## 2012-08-02 ENCOUNTER — Emergency Department (HOSPITAL_COMMUNITY)
Admission: EM | Admit: 2012-08-02 | Discharge: 2012-08-02 | Disposition: A | Discharge status: Home / Self Care | Payer: BC Managed Care – PPO | Attending: Emergency Medicine | Admitting: Emergency Medicine

## 2012-08-02 DIAGNOSIS — Z8781 Personal history of (healed) traumatic fracture: Secondary | ICD-10-CM | POA: Insufficient documentation

## 2012-08-02 DIAGNOSIS — G8929 Other chronic pain: Secondary | ICD-10-CM

## 2012-08-02 DIAGNOSIS — F19939 Other psychoactive substance use, unspecified with withdrawal, unspecified: Secondary | ICD-10-CM | POA: Insufficient documentation

## 2012-08-02 DIAGNOSIS — M545 Low back pain, unspecified: Secondary | ICD-10-CM | POA: Insufficient documentation

## 2012-08-02 DIAGNOSIS — J45909 Unspecified asthma, uncomplicated: Secondary | ICD-10-CM | POA: Insufficient documentation

## 2012-08-02 DIAGNOSIS — F17200 Nicotine dependence, unspecified, uncomplicated: Secondary | ICD-10-CM | POA: Insufficient documentation

## 2012-08-02 DIAGNOSIS — Z880 Allergy status to penicillin: Secondary | ICD-10-CM | POA: Insufficient documentation

## 2012-08-02 DIAGNOSIS — Z8739 Personal history of other diseases of the musculoskeletal system and connective tissue: Secondary | ICD-10-CM | POA: Insufficient documentation

## 2012-08-02 DIAGNOSIS — G8921 Chronic pain due to trauma: Secondary | ICD-10-CM | POA: Insufficient documentation

## 2012-08-02 DIAGNOSIS — F19239 Other psychoactive substance dependence with withdrawal, unspecified: Secondary | ICD-10-CM | POA: Insufficient documentation

## 2012-08-02 DIAGNOSIS — Z872 Personal history of diseases of the skin and subcutaneous tissue: Secondary | ICD-10-CM | POA: Insufficient documentation

## 2012-08-02 DIAGNOSIS — M549 Dorsalgia, unspecified: Secondary | ICD-10-CM

## 2012-08-02 DIAGNOSIS — Z79899 Other long term (current) drug therapy: Secondary | ICD-10-CM | POA: Insufficient documentation

## 2012-08-02 DIAGNOSIS — I10 Essential (primary) hypertension: Secondary | ICD-10-CM | POA: Insufficient documentation

## 2012-08-02 DIAGNOSIS — R112 Nausea with vomiting, unspecified: Secondary | ICD-10-CM | POA: Insufficient documentation

## 2012-08-02 MED ORDER — DIAZEPAM 5 MG PO TABS: 5.0000 mg | ORAL_TABLET | Freq: Two times a day (BID) | ORAL | Status: DC

## 2012-08-02 MED ORDER — PROMETHAZINE HCL 25 MG PO TABS: 25.0000 mg | ORAL_TABLET | Freq: Four times a day (QID) | ORAL | Status: DC | PRN

## 2012-08-02 MED ORDER — DIAZEPAM 5 MG PO TABS
5.0000 mg | ORAL_TABLET | Freq: Once | ORAL | Status: AC
  Administered 2012-08-02: 5 mg via ORAL
  Filled 2012-08-02: qty 1
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
## 2012-08-02 NOTE — Telephone Encounter (Signed)
Denied per Dr Tawanna Cooler.  Patient should go to behavior health or ER
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
## 2012-08-03 ENCOUNTER — Telehealth: Payer: Self-pay | Admitting: Family Medicine

## 2012-08-03 NOTE — Telephone Encounter (Signed)
Caller: Ryan [name]/Patient; Phone: [phone]; Reason for Call: Pt requesting recommendation on withdrawal from pain medications.  PLEASE F/U WITH PT WITH PROVIDER RECOMMENDATIONS.  THANK YOU.

## 2012-08-04 NOTE — ED Provider Notes (Signed)
  Medical screening examination/treatment/procedure(s) were performed by non-physician practitioner and as supervising physician I was immediately available for consultation/collaboration.    Gerhard Munch, MD 08/04/12 [phone]

## 2012-08-07 NOTE — Telephone Encounter (Signed)
The patient was referred to behavior health for evaluation

## 2012-08-10 ENCOUNTER — Encounter: Payer: Self-pay | Admitting: Internal Medicine

## 2012-08-10 ENCOUNTER — Ambulatory Visit (INDEPENDENT_AMBULATORY_CARE_PROVIDER_SITE_OTHER): Payer: BC Managed Care – PPO | Admitting: Internal Medicine

## 2012-08-10 ENCOUNTER — Telehealth: Payer: Self-pay | Admitting: Family Medicine

## 2012-08-10 VITALS — BP 130/100 | HR 101 | Temp 97.8°F | Wt 193.0 lb

## 2012-08-10 DIAGNOSIS — R109 Unspecified abdominal pain: Secondary | ICD-10-CM

## 2012-08-10 DIAGNOSIS — R310 Gross hematuria: Secondary | ICD-10-CM

## 2012-08-10 DIAGNOSIS — R10A0 Flank pain, unspecified side: Secondary | ICD-10-CM

## 2012-08-10 LAB — POCT URINALYSIS DIPSTICK
Bilirubin, UA: NEGATIVE
Glucose, UA: NEGATIVE
Ketones, UA: NEGATIVE
Leukocytes, UA: NEGATIVE
Nitrite, UA: NEGATIVE
Spec Grav, UA: >=1.03
Urobilinogen, UA: 0.2
pH, UA: 5.5

## 2012-08-10 MED ORDER — CIPROFLOXACIN HCL 500 MG PO TABS: 500.0000 mg | ORAL_TABLET | Freq: Two times a day (BID) | ORAL | Status: DC

## 2012-08-10 NOTE — Telephone Encounter (Signed)
Pt saw Dr Fabian Sharp this am for blood in urine. Pt states he was asked if he wanted valium refilled. Pt has decided that he would like that valium refilled afterall.  Per Dr Tawanna Cooler, no refills.
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
## 2012-08-10 NOTE — Patient Instructions (Signed)
We are going to treat you is if you have an infection cause of blood in your urine. Urine culture will be done  Ever this may not be the cause and we need to have you see a urologist for further evaluation.  Call the on-call service over the weekend if needed.    Hematuria, Adult Hematuria (blood in your urine) can be caused by a bladder infection (cystitis), kidney infection (pyelonephritis), prostate infection (prostatitis), or kidney stone. Infections will usually respond to antibiotics (medications which kill germs), and a kidney stone will usually pass through your urine without further treatment. If you were put on antibiotics, take all the medicine until gone. You may feel better in a few days, but take all of your medicine or the infection may not respond and become more difficult to treat. If antibiotics were not given, an infection did not cause the blood in the urine. A further work up to find out the reason may be needed. HOME CARE INSTRUCTIONS   Drink lots of fluid, 3 to 4 quarts a day. If you have been diagnosed with an infection, cranberry juice is especially recommended, in addition to large amounts of water.  Avoid caffeine, tea, and carbonated beverages, because they tend to irritate the bladder.  Avoid alcohol as it may irritate the prostate.  Only take over-the-counter or prescription medicines for pain, discomfort, or fever as directed by your caregiver.  If you have been diagnosed with a kidney stone follow your caregivers instructions regarding straining your urine to catch the stone. TO PREVENT FURTHER INFECTIONS:  Empty the bladder often. Avoid holding urine for long periods of time.  After a bowel movement, women should cleanse front to back. Use each tissue only once.  Empty the bladder before and after sexual intercourse if you are a male.  Return to your caregiver if you develop back pain, fever, nausea (feeling sick to your stomach), vomiting, or your  symptoms (problems) are not better in 3 days. Return sooner if you are getting worse. If you have been requested to return for further testing make sure to keep your appointments. If an infection is not the cause of blood in your urine, X-rays may be required. Your caregiver will discuss this with you. SEEK IMMEDIATE MEDICAL CARE IF:   You have a persistent fever over 102 F (38.9 C).  You develop severe vomiting and are unable to keep the medication down.  You develop severe back or abdominal pain despite taking your medications.  You begin passing a large amount of blood or clots in your urine.  You feel extremely weak or faint, or pass out. MAKE SURE YOU:   Understand these instructions.  Will watch your condition.  Will get help right away if you are not doing well or get worse. Document Released: 03/14/2005 Document Revised: 06/06/2011 Document Reviewed: 11/01/2007 [facility] Patient Information 2013 [place], [place].

## 2012-08-11 ENCOUNTER — Emergency Department (HOSPITAL_COMMUNITY): Payer: BC Managed Care – PPO

## 2012-08-11 ENCOUNTER — Encounter (HOSPITAL_COMMUNITY): Payer: Self-pay | Admitting: Emergency Medicine

## 2012-08-11 ENCOUNTER — Emergency Department (HOSPITAL_COMMUNITY)
Admission: EM | Admit: 2012-08-11 | Discharge: 2012-08-11 | Disposition: A | Discharge status: Home / Self Care | Payer: BC Managed Care – PPO | Attending: Emergency Medicine | Admitting: Emergency Medicine

## 2012-08-11 ENCOUNTER — Telehealth: Payer: Self-pay | Admitting: Family Medicine

## 2012-08-11 DIAGNOSIS — R110 Nausea: Secondary | ICD-10-CM | POA: Insufficient documentation

## 2012-08-11 DIAGNOSIS — Z79899 Other long term (current) drug therapy: Secondary | ICD-10-CM | POA: Insufficient documentation

## 2012-08-11 DIAGNOSIS — I10 Essential (primary) hypertension: Secondary | ICD-10-CM | POA: Insufficient documentation

## 2012-08-11 DIAGNOSIS — N200 Calculus of kidney: Secondary | ICD-10-CM | POA: Insufficient documentation

## 2012-08-11 DIAGNOSIS — Z872 Personal history of diseases of the skin and subcutaneous tissue: Secondary | ICD-10-CM | POA: Insufficient documentation

## 2012-08-11 DIAGNOSIS — F17200 Nicotine dependence, unspecified, uncomplicated: Secondary | ICD-10-CM | POA: Insufficient documentation

## 2012-08-11 DIAGNOSIS — Z880 Allergy status to penicillin: Secondary | ICD-10-CM | POA: Insufficient documentation

## 2012-08-11 DIAGNOSIS — J45909 Unspecified asthma, uncomplicated: Secondary | ICD-10-CM | POA: Insufficient documentation

## 2012-08-11 DIAGNOSIS — Z8781 Personal history of (healed) traumatic fracture: Secondary | ICD-10-CM | POA: Insufficient documentation

## 2012-08-11 DIAGNOSIS — R319 Hematuria, unspecified: Secondary | ICD-10-CM | POA: Insufficient documentation

## 2012-08-11 LAB — URINE MICROSCOPIC-ADD ON

## 2012-08-11 LAB — BASIC METABOLIC PANEL
BUN: 10 mg/dL (ref 6–23)
CO2: 21 mEq/L (ref 19–32)
Calcium: 9 mg/dL (ref 8.4–10.5)
Chloride: 105 mEq/L (ref 96–112)
Creatinine, Ser: 0.72 mg/dL (ref 0.50–1.35)
GFR calc Af Amer: >90 mL/min (ref >90)
GFR calc non Af Amer: >90 mL/min (ref >90)
Glucose, Bld: 94 mg/dL (ref 70–99)
Potassium: 4 mEq/L (ref 3.5–5.1)
Sodium: 137 mEq/L (ref 135–145)

## 2012-08-11 LAB — URINALYSIS, ROUTINE W REFLEX MICROSCOPIC
Bilirubin Urine: NEGATIVE
Glucose, UA: NEGATIVE mg/dL
Ketones, ur: NEGATIVE mg/dL
Leukocytes, UA: NEGATIVE
Nitrite: NEGATIVE
Protein, ur: NEGATIVE mg/dL
Specific Gravity, Urine: 1.019 (ref 1.005–1.030)
Urobilinogen, UA: 0.2 mg/dL (ref 0.0–1.0)
pH: 6 (ref 5.0–8.0)

## 2012-08-11 MED ORDER — HYDROCODONE-ACETAMINOPHEN 5-325 MG PO TABS: 1.0000 | ORAL_TABLET | Freq: Four times a day (QID) | ORAL | Status: DC | PRN

## 2012-08-11 MED ORDER — MORPHINE SULFATE 4 MG/ML IJ SOLN
4.0000 mg | Freq: Once | INTRAMUSCULAR | Status: AC
  Administered 2012-08-11: 4 mg via INTRAVENOUS
  Filled 2012-08-11: qty 1

## 2012-08-11 MED ORDER — KETOROLAC TROMETHAMINE 30 MG/ML IJ SOLN
30.0000 mg | Freq: Once | INTRAMUSCULAR | Status: AC
  Administered 2012-08-11: 30 mg via INTRAVENOUS
  Filled 2012-08-11: qty 1

## 2012-08-11 MED ORDER — ONDANSETRON HCL 4 MG/2ML IJ SOLN
4.0000 mg | Freq: Once | INTRAMUSCULAR | Status: AC
  Administered 2012-08-11: 4 mg via INTRAVENOUS
  Filled 2012-08-11: qty 2

## 2012-08-11 NOTE — ED Notes (Signed)
Nurse attempting to obtain labs with IV start 
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
## 2012-08-11 NOTE — ED Notes (Signed)
Pt states he began having hemauria on Tuesday after having epidural pain shot in the am.  Pt states on Wednesday he urinated straight blood, turning into clots on Thursday.  Went to pcp on Friday and was started on abx.  Pt states pain has been in R flank, but now is lower abd. Pt states he hasn't drank much and states he feels no need to urinate at this time.

## 2012-08-12 LAB — URINE CULTURE
Colony Count: NO GROWTH
Organism ID, Bacteria: NO GROWTH

## 2012-08-13 NOTE — Telephone Encounter (Signed)
[facility] Triage Call Report Triage Record Num: [id number] Operator: Geanie Berlin Patient Name: Ryan [name] Call Date & Time: 08/11/2012 10:45:52AM Patient Phone: [phone] PCP: Eugenio Hoes. Todd Patient Gender: Male PCP Fax : [phone] Patient DOB: 05/18/1985 Practice Name: [facility]  Reason for Call: Caller: Ryan [name]/Patient; PCP: Kelle Darting [facility]); CB#: [phone]; Call regarding Pelvic pain; Onset 08/10/12. Afebrile. Noted blood in urine since 08/07/12. Diagnosed wtih gross hematuria 08/10/12 and started on Cipro. Forgot to mention to MD he had spinal epidural for pain 08/03/12 at the [place] clinic. Noted Left flank pain present 08/10/12 resolved; but now has severe pelvic pain rated 9/10. Also has frequency, urgency and feeling of incomplete void. Reports UC would not see him due to the epidural. Advised to go to [facility] ED now for unbearable lower abdominal pain per Bloody Urine guideline. Protocol(s) Used: Bloody Urine  Recommended Outcome per Protocol: See ED Immediately Override Outcome if Used in Protocol: See Provider Immediately RN Reason for Override Outcome: Office Is Open. Reason for Outcome: Unbearable flank, low back or lower abdominal pain Care Advice: ~ Another adult should drive. ~ Do not give the patient anything to eat or drink. Call EMS 911 if signs and symptoms of shock develop (such as unable to stand due to faintness, dizziness, or lightheadedness; new onset of confusion; slow to respond or difficult to awaken; skin is pale, gray, cool, or moist to touch; severe weakness; loss of consciousness). ~ Write down provider's name. List or place the following in a bag for transport with the patient: current prescription and/or nonprescription medications; alternative treatments, therapies and medications; and street drugs.

## 2012-08-14 ENCOUNTER — Telehealth: Payer: Self-pay | Admitting: Family Medicine

## 2012-08-14 DIAGNOSIS — M549 Dorsalgia, unspecified: Secondary | ICD-10-CM

## 2012-08-14 NOTE — Telephone Encounter (Signed)
PT called and sounded upset. He stated that he would like to be referred to a different neurosurgeon, because the one he's seen, isn't working out. Please assist.

## 2012-08-14 NOTE — Telephone Encounter (Signed)
Referral sent for neurologist

## 2012-08-14 NOTE — Telephone Encounter (Signed)
Pt would like to be referred to someone else that could provide epidurals, whether it be an orthopedic or neuro. Pls advise.

## 2012-08-15 NOTE — ED Provider Notes (Signed)
Medical screening examination/treatment/procedure(s) were performed by non-physician practitioner and as supervising physician I was immediately available for consultation/collaboration.   Richardean Canal, MD 08/15/12 [phone]

## 2012-08-16 ENCOUNTER — Encounter: Payer: Self-pay | Admitting: Internal Medicine

## 2012-08-23 ENCOUNTER — Ambulatory Visit (INDEPENDENT_AMBULATORY_CARE_PROVIDER_SITE_OTHER): Payer: BC Managed Care – PPO | Admitting: Family Medicine

## 2012-08-23 ENCOUNTER — Encounter: Payer: Self-pay | Admitting: *Deleted

## 2012-08-23 VITALS — BP 124/82 | HR 72 | Temp 97.7°F | Resp 16 | Ht 72.0 in | Wt 203.0 lb

## 2012-08-23 DIAGNOSIS — K625 Hemorrhage of anus and rectum: Secondary | ICD-10-CM

## 2012-08-23 DIAGNOSIS — R310 Gross hematuria: Secondary | ICD-10-CM

## 2012-08-23 LAB — BASIC METABOLIC PANEL
BUN: 9 mg/dL (ref 6–23)
CO2: 23 mEq/L (ref 19–32)
Calcium: 9.3 mg/dL (ref 8.4–10.5)
Chloride: 106 mEq/L (ref 96–112)
Creatinine, Ser: 0.8 mg/dL (ref 0.4–1.5)
GFR: 130.84 mL/min (ref >60.00)
Glucose, Bld: 76 mg/dL (ref 70–99)
Potassium: 3.7 mEq/L (ref 3.5–5.1)
Sodium: 137 mEq/L (ref 135–145)

## 2012-08-23 LAB — HEPATIC FUNCTION PANEL
ALT: 28 U/L (ref 0–53)
AST: 18 U/L (ref 0–37)
Albumin: 4 g/dL (ref 3.5–5.2)
Alkaline Phosphatase: 75 U/L (ref 39–117)
Bilirubin, Direct: 0 mg/dL (ref 0.0–0.3)
Total Bilirubin: 0.5 mg/dL (ref 0.3–1.2)
Total Protein: 7 g/dL (ref 6.0–8.3)

## 2012-08-23 LAB — CBC WITH DIFFERENTIAL/PLATELET
Basophils Absolute: 0.1 10*3/uL (ref 0.0–0.1)
Basophils Relative: 0.6 % (ref 0.0–3.0)
Eosinophils Absolute: 0 10*3/uL (ref 0.0–0.7)
Eosinophils Relative: 0 % (ref 0.0–5.0)
HCT: 45.4 % (ref 39.0–52.0)
Hemoglobin: 15.8 g/dL (ref 13.0–17.0)
Lymphocytes Relative: 23.7 % (ref 12.0–46.0)
Lymphs Abs: 2.8 10*3/uL (ref 0.7–4.0)
MCHC: 34.7 g/dL (ref 30.0–36.0)
MCV: 93.9 fl (ref 78.0–100.0)
Monocytes Absolute: 0.7 10*3/uL (ref 0.1–1.0)
Monocytes Relative: 6.1 % (ref 3.0–12.0)
Neutro Abs: 8.3 10*3/uL — ABNORMAL HIGH (ref 1.4–7.7)
Neutrophils Relative %: 69.6 % (ref 43.0–77.0)
Platelets: 275 10*3/uL (ref 150.0–400.0)
RBC: 4.83 Mil/uL (ref 4.22–5.81)
RDW: 12.8 % (ref 11.5–14.6)
WBC: 12 10*3/uL — ABNORMAL HIGH (ref 4.5–10.5)

## 2012-08-23 NOTE — Progress Notes (Signed)
  Subjective:    Patient ID: Ryan [name], male    DOB: November 05, 1985, 27 y.o.   MRN: [id number]  HPI Ryan [name] is a 27 year old male who comes in today accompanied by his mother for evaluation of multiple issues  He was seen here with hematuria and was felt to have a urinary tract infection. He was placed on Cipro 500 twice a day and sent to urology for followup. A CT scan of the abdomen and pelvis without contrast shows several small bilateral renal calculi no hydronephrosis ureteral stone or recent stone passage. He did have some diverticulosis however and no abscess.  He continues to have urologic symptoms with frequency and burning. He says he went to the urologist recently but they were not able to give him a complete answer for her symptoms  He's also had dark bowel movements and as an appointment in GI tomorrow to see Dr. Jarold Motto. He's never had a GI problems in the past     Review of Systems    review of systems otherwise negative no fever chills vomiting Objective:   Physical Exam  Well-developed well-nourished male no acute distress      Assessment & Plan:  History of dark blood in the bowel movements,,,,,,,,,,,,, labs today,,,,,, Dr. Jarold Motto tomorrow,,,,,,,, clear liquid diet  Urinary tract symptoms,,,,,,,,, followup by urology

## 2012-08-23 NOTE — Patient Instructions (Signed)
Stay on a clear liquid diet until you see Dr. Jarold Motto  Labs today  The best thing is to make an appointment to see the urologist to discuss your concerns

## 2012-08-24 ENCOUNTER — Other Ambulatory Visit (INDEPENDENT_AMBULATORY_CARE_PROVIDER_SITE_OTHER): Payer: BC Managed Care – PPO

## 2012-08-24 ENCOUNTER — Telehealth: Payer: Self-pay | Admitting: Gastroenterology

## 2012-08-24 ENCOUNTER — Telehealth: Payer: Self-pay | Admitting: *Deleted

## 2012-08-24 ENCOUNTER — Ambulatory Visit (INDEPENDENT_AMBULATORY_CARE_PROVIDER_SITE_OTHER): Payer: BC Managed Care – PPO | Admitting: Gastroenterology

## 2012-08-24 ENCOUNTER — Encounter: Payer: Self-pay | Admitting: Gastroenterology

## 2012-08-24 VITALS — BP 138/92 | HR 101 | Ht 71.75 in | Wt 204.1 lb

## 2012-08-24 DIAGNOSIS — N200 Calculus of kidney: Secondary | ICD-10-CM

## 2012-08-24 DIAGNOSIS — K5732 Diverticulitis of large intestine without perforation or abscess without bleeding: Secondary | ICD-10-CM

## 2012-08-24 DIAGNOSIS — K921 Melena: Secondary | ICD-10-CM

## 2012-08-24 DIAGNOSIS — R1032 Left lower quadrant pain: Secondary | ICD-10-CM

## 2012-08-24 LAB — SEDIMENTATION RATE: Sed Rate: 6 mm/hr (ref 0–22)

## 2012-08-24 LAB — CBC WITH DIFFERENTIAL/PLATELET
Basophils Absolute: 0.1 10*3/uL (ref 0.0–0.1)
Basophils Relative: 0.7 % (ref 0.0–3.0)
Eosinophils Absolute: 0.1 10*3/uL (ref 0.0–0.7)
Eosinophils Relative: 1.2 % (ref 0.0–5.0)
HCT: 45.2 % (ref 39.0–52.0)
Hemoglobin: 15.9 g/dL (ref 13.0–17.0)
Lymphocytes Relative: 31.1 % (ref 12.0–46.0)
Lymphs Abs: 2.6 10*3/uL (ref 0.7–4.0)
MCHC: 35.2 g/dL (ref 30.0–36.0)
MCV: 92.2 fl (ref 78.0–100.0)
Monocytes Absolute: 0.6 10*3/uL (ref 0.1–1.0)
Monocytes Relative: 7 % (ref 3.0–12.0)
Neutro Abs: 5.1 10*3/uL (ref 1.4–7.7)
Neutrophils Relative %: 60 % (ref 43.0–77.0)
Platelets: 279 10*3/uL (ref 150.0–400.0)
RBC: 4.9 Mil/uL (ref 4.22–5.81)
RDW: 12.6 % (ref 11.5–14.6)
WBC: 8.4 10*3/uL (ref 4.5–10.5)

## 2012-08-24 LAB — BASIC METABOLIC PANEL
BUN: 9 mg/dL (ref 6–23)
CO2: 24 mEq/L (ref 19–32)
Calcium: 9.7 mg/dL (ref 8.4–10.5)
Chloride: 108 mEq/L (ref 96–112)
Creatinine, Ser: 0.9 mg/dL (ref 0.4–1.5)
GFR: 110.24 mL/min (ref >60.00)
Glucose, Bld: 91 mg/dL (ref 70–99)
Potassium: 4.1 mEq/L (ref 3.5–5.1)
Sodium: 139 mEq/L (ref 135–145)

## 2012-08-24 LAB — IBC PANEL
Iron: 166 ug/dL — ABNORMAL HIGH (ref 42–165)
Saturation Ratios: 36.7 % (ref 20.0–50.0)
Transferrin: 323.1 mg/dL (ref 212.0–360.0)

## 2012-08-24 LAB — VITAMIN B12: Vitamin B-12: 197 pg/mL — ABNORMAL LOW (ref 211–911)

## 2012-08-24 LAB — HEPATIC FUNCTION PANEL
ALT: 25 U/L (ref 0–53)
AST: 15 U/L (ref 0–37)
Albumin: 4.2 g/dL (ref 3.5–5.2)
Alkaline Phosphatase: 78 U/L (ref 39–117)
Bilirubin, Direct: 0.1 mg/dL (ref 0.0–0.3)
Total Bilirubin: 0.5 mg/dL (ref 0.3–1.2)
Total Protein: 7.4 g/dL (ref 6.0–8.3)

## 2012-08-24 LAB — FERRITIN: Ferritin: 117.1 ng/mL (ref 22.0–322.0)

## 2012-08-24 LAB — C-REACTIVE PROTEIN: CRP: <0.5 mg/dL (ref 0.5–20.0)

## 2012-08-24 LAB — TSH: TSH: 0.29 u[IU]/mL — ABNORMAL LOW (ref 0.35–5.50)

## 2012-08-24 LAB — FOLATE: Folate: 7.5 ng/mL (ref >5.9)

## 2012-08-24 MED ORDER — CIPROFLOXACIN HCL 500 MG PO TABS: 500.0000 mg | ORAL_TABLET | Freq: Two times a day (BID) | ORAL | Status: DC

## 2012-08-24 MED ORDER — METRONIDAZOLE 500 MG PO TABS: 500.0000 mg | ORAL_TABLET | Freq: Two times a day (BID) | ORAL | Status: DC

## 2012-08-24 MED ORDER — HYDROCODONE-ACETAMINOPHEN 10-325 MG PO TABS: 1.0000 | ORAL_TABLET | Freq: Four times a day (QID) | ORAL | Status: DC | PRN

## 2012-08-24 NOTE — Telephone Encounter (Signed)
Message copied by Florene Glen on Fri Aug 24, 2012  2:58 PM ------      Message from: PATTERSON, DAVID R      Created: Fri Aug 24, 2012 12:27 PM       He is on B12 1000 mcg a day by mouth ------

## 2012-08-24 NOTE — Progress Notes (Signed)
History of Present Illness:  This is a somewhat confusing patient age 27 years he presents with 2-3 weeks of recurrent lower abdominal and back pain initially felt secondary to kidney stones with gross hematuria.  CT scan on 517 was unremarkable except for diverticulosis of the colon without diverticulitis.  He been treated with 5 days of Cipro by Dr. Fabian Sharp when he had gross hematuria, and was subsequently referred to urology who felt that he may be having bladder spasms related to stones, but there is no evidence of urologic obstruction.Marland Kitchen  He saw urology on 5/28 of this month.  He apparently had had an epidural injection on 5/9 and developed abdominal pain on 5/12.  CT scan was on 5/17  At this time the patient describes intermittent dark tarry stools without any dyspepsia or upper GI symptoms.  His lower abdominal pain has been localized to the left lower quadrant and is worse with sitting and best lying flat.  He has not noticed any food intolerance but has had a couple of loose dark stools.  Diarrhea certainly not a major problem.  He had some initial nausea and one episode of emesis without hematemesis.  He denies infectious disease exposure or foreign travel.  He's used recently NSAIDs for pain without relief, but denies chronic abuse of NSAIDs or salicylates.  He is on Cozaar 50 mg a day and Qvar halos as needed.  He is noncontributory.  The patient denies abuse of alcohol or cigarettes.  He has not had previous abdominal surgery except for an appendectomy.  I have reviewed this patient's present history, medical and surgical past history, allergies and medications.     ROS:   All systems were reviewed and are negative unless otherwise stated in the HPI.    Physical Exam: Blood pressure 138/92, pulse 101, and weight 204 pounds with a BMI of 27.89. General well developed well nourished patient in no acute distress, appearing their stated age Eyes PERRLA, no icterus, fundoscopic exam per  opthamologist Skin no lesions noted Neck supple, no adenopathy, no thyroid enlargement, no tenderness Chest clear to percussion and auscultation Heart no significant murmurs, gallops or rubs noted Abdomen his abdomen is soft and not distended.  There is no organomegaly.  There is rather marked tenderness the left lower quadrant without rebound.  Bowel sounds are present but diminished. Rectal inspection normal no fissures, or fistulae noted.  No masses or tenderness on digital exam. Stool guaiac ++ Extremities no acute joint lesions, edema, phlebitis or evidence of cellulitis. Neurologic patient oriented x 3, cranial nerves intact, no focal neurologic deficits noted. Psychological mental status normal and normal affect.  Assessment and plan: This patient has either subacute diverticulitis or ischemic colitis  associated with his multiple recent urologic problems and epidural injection.  CT scan previously did show diverticulosis but no evidence of ischemic colitis acute diverticulitis on that exam.  I have decided to repeat his CT scan of his abdomen and pelvis, repeat his labs including CBC, liver test and iron studies, have placed him on metronidazole 500 mg twice a day and Cipro 500 mg twice a day for 7-10 days with when necessary hydrocodone 10 mg every 6 hours as needed.  Also I have advised low fiber diet, but have asked him to keep himself well hydrated.  He may need repeat urologic evaluation depending on the repeat CT scan findings.  He may need colonoscopy exam depending on his workup and clinical course.  Please copy her primary care  physician, referring physician, and pertinent subspecialists.  Encounter Diagnosis  Name Primary?  Marland Kitchen LLQ pain Yes

## 2012-08-24 NOTE — Telephone Encounter (Signed)
Pt was told to call the prescriber of the diazepam and ask if he should continue, pt agreed

## 2012-08-24 NOTE — Telephone Encounter (Signed)
Gave pt lab results and instructed him to begin 1000 mcg of B12 daily. I will forward the labs to Dr Tawanna Cooler and also mailed pt a copy. Pt stated understanding.

## 2012-08-24 NOTE — Patient Instructions (Addendum)
We have sent the following medications to your pharmacy for you to pick up at your convenience: You have been scheduled for a CT scan of the abdomen and pelvis at [facility] ([address]---this is in the same building as [facility]).   You are scheduled on 08/27/12 at 9 am. You should arrive 15 minutes prior to your appointment time for registration. Please follow the written instructions below on the day of your exam:  WARNING: IF YOU ARE ALLERGIC TO IODINE/X-RAY DYE, PLEASE NOTIFY RADIOLOGY IMMEDIATELY AT [phone]! YOU WILL BE GIVEN A 13 HOUR PREMEDICATION PREP.  1) Do not eat or drink anything after 5 am (4 hours prior to your test) 2) You have been given 2 bottles of oral contrast to drink. The solution may taste better if refrigerated, but do NOT add ice or any other liquid to this solution. Shake well before drinking.    Drink 1 bottle of contrast @ 7 am (2 hours prior to your exam)  Drink 1 bottle of contrast @ 8 am (1 hour prior to your exam)  You may take any medications as prescribed with a small amount of water except for the following: Metformin, Glucophage, Glucovance, Avandamet, Riomet, Fortamet, Actoplus Met, Janumet, Glumetza or Metaglip. The above medications must be held the day of the exam AND 48 hours after the exam.  The purpose of you drinking the oral contrast is to aid in the visualization of your intestinal tract. The contrast solution may cause some diarrhea. Before your exam is started, you will be given a small amount of fluid to drink. Depending on your individual set of symptoms, you may also receive an intravenous injection of x-ray contrast/dye. Plan on being at [facility] for 30 minutes or long, depending on the type of exam you are having performed.  This test typically takes 30-45 minutes to complete.  If you have any questions regarding your exam or if you need to reschedule, you may call the CT department at [phone]  between the hours of 8:00 am and 5:00 pm, Monday-Friday. Your physician has requested that you go to the basement for  lab work before leaving today.  Low-Fiber Diet Fiber is found in fruits, vegetables, and grains. A low-fiber diet restricts fibrous foods that are not digested in the small intestine. A diet containing about 10 grams of fiber is considered low fiber.  PURPOSE  To prevent blockage of a partially obstructed or narrowed gastrointestinal tract.  To reduce fecal weight and volume.  To slow the movement of feces. WHEN IS THIS DIET USED?  It may be used during the acute phase of Crohn disease, ulcerative colitis, regional enteritis, or diverticulitis.  It may be used if your intestinal or esophageal tubes are narrowing (stenosis).  It may be used as a transitional diet following surgery, injury (trauma), or illness. CHOOSING FOODS Check labels, especially on foods from the starch list. Often times, dietary fiber content is listed on the nutrition facts panel. Please ask your Registered Dietitian if you have questions about specific foods that are related to your condition, especially if the food is not listed on this handout. Breads and Starches  Allowed: White, [place], and pita breads, plain rolls, buns, or sweet rolls, doughnuts, waffles, pancakes, bagels. Plain muffins, biscuits, matzoth. Soda, saltine, graham crackers. Pretzels, rusks, melba toast, zwieback. Cooked cereals: cornmeal, farina, or cream cereals. Dry cereals: refined corn, wheat, rice, and oat cereals (check label). Potatoes prepared any way without skins,  refined macaroni, spaghetti, noodles, refined rice.  Avoid: Whole-wheat bread, rolls, and crackers. Multigrains, rye, bran seeds, nuts, or coconut. Cereals containing whole grains, multigrains, bran, coconut, nuts, raisins. Cooked or dry oatmeal. Coarse wheat cereals, granola. Cereals advertised as "high fiber." Potato skins. Whole-grain pasta, wild or brown  rice. Popcorn. Vegetables  Allowed: Strained tomato and vegetable juices. Fresh lettuce, cucumber, spinach. Well-cooked or canned: asparagus, bean sprouts, broccoli, cut green beans, cauliflower, pumpkin, beets, mushrooms, olives, yellow squash, tomato, tomato sauce, zucchini, turnips.Keep servings limited to  cup.  Avoid: Fresh, cooked, or canned: artichokes, baked beans, beet greens, Brussels sprouts, corn, kale, legumes, peas, sweet potatoes. Avoid large servings of any vegetables. Fruit  Allowed: All fruit juices except prune juice. Cooked or canned fruits without skin and seeds: apricots, applesauce, cantaloupe, cherries, grapefruit, grapes, kiwi, mandarin oranges, peaches, pears, fruit cocktail, pineapple, plums, watermelon. Fresh without skin: banana, grapes, cantaloupe, avocado, cherries, pineapple, kiwi, nectarines, peaches, blueberries. Keep servings limited to  cup or 1 piece.  Avoid: Fresh: apples with or without skin, apricots, mangoes, pears, raspberries, strawberries. Prune juice and juices with pulp, stewed or dried prunes. Dried fruits, raisins, dates. Avoid large servings of all fresh fruits. Meat and Protein Substitutes  Allowed: Ground or well-cooked tender beef, ham, veal, lamb, pork, poultry. Eggs, plain cheese. Fish, oysters, shrimp, lobster, other seafood. Liver, organ meats. Smooth nut butters.  Avoid: Tough, fibrous meats with gristle. Chunky nut butter.Cheese with seeds, nuts, or other foods not allowed. Nuts, seeds, legumes, dried peas, beans, lentils. Dairy  Allowed: All milk products except those not allowed.  Avoid: Yogurt or cheese that contains nuts, seeds, or added fruit. Soups and Combination Foods  Allowed: Bouillon, broth, or cream soups made from allowed foods. Any strained soup. Casseroles or mixed dishes made with allowed foods.  Avoid: Soups made from vegetables that are not allowed or that contain other foods not allowed. Desserts and  Sweets  Allowed:Plain cakes and cookies, pie made with allowed fruit, pudding, custard, cream pie. Gelatin, fruit, ice, sherbet, frozen ice pops. Ice cream, ice milk without nuts. Plain hard candy, honey, jelly, molasses, syrup, sugar, chocolate syrup, gumdrops, marshmallows.  Avoid: Desserts, cookies, or candies that contain nuts, peanut butter, dried fruits. Jams, preserves with seeds, marmalade. Fats and Oils  Allowed:Margarine, butter, cream, mayonnaise, salad oils, plain salad dressings made from allowed foods.  Avoid: Seeds, nuts, olives. Beverages  Allowed: All, except those listed to avoid.  Avoid: Fruit juices with high pulp, prune juice. Condiments  Allowed:Ketchup, mustard, horseradish, vinegar, cream sauce, cheese sauce, cocoa powder. Spices in moderation: allspice, basil, bay leaves, celery powder or leaves, cinnamon, cumin powder, curry powder, ginger, mace, marjoram, onion or garlic powder, oregano, paprika, parsley flakes, ground pepper, rosemary, sage, savory, tarragon, thyme, turmeric.  Avoid: Coconut, pickles. SAMPLE MENU Breakfast   cup orange juice.  1 boiled egg.  1 slice white toast.  Margarine.   cup cornflakes.  1 cup milk.  Beverage. Lunch   cup chicken noodle soup.  2 to 3 oz sliced roast beef.  2 slices white bread.  Mayonnaise.   cup tomato juice.  1 small banana.  Beverage. Dinner  3 oz baked chicken.   cup scalloped potatoes.   cup cooked beets.  White dinner roll.  Margarine.   cup canned peaches.  Beverage. Document Released: 09/03/2001 Document Revised: 06/06/2011 Document Reviewed: 03/31/2011 [facility] Patient Information 2014 [place], [place].

## 2012-08-27 ENCOUNTER — Ambulatory Visit (INDEPENDENT_AMBULATORY_CARE_PROVIDER_SITE_OTHER)
Admission: RE | Admit: 2012-08-27 | Discharge: 2012-08-27 | Disposition: A | Discharge status: Home / Self Care | Payer: BC Managed Care – PPO | Source: Ambulatory Visit | Attending: Gastroenterology | Admitting: Gastroenterology

## 2012-08-27 ENCOUNTER — Telehealth: Payer: Self-pay | Admitting: Gastroenterology

## 2012-08-27 DIAGNOSIS — R1032 Left lower quadrant pain: Secondary | ICD-10-CM

## 2012-08-27 MED ORDER — IOHEXOL 300 MG/ML  SOLN
100.0000 mL | Freq: Once | INTRAMUSCULAR | Status: AC | PRN
  Administered 2012-08-27: 100 mL via INTRAVENOUS

## 2012-08-27 NOTE — Telephone Encounter (Signed)
Pt called on call MD tonight via answering service  --still with mid-abd pain, severe in nature, radiating up and left slightly --using hydrocodone 2 tabs which takes pain from 10 to 8/10 --having hard time urinating with urgency but inability to completely empty bladder, urge to urinate every 10-15 min --tearful at times because he wants a solution. --reports he has had urology eval, cystoscopy without etiology  CT findings reviewed, no acute findings, sigmoid diverticulosis without diverticulitis  I have advised ED visit tonight, he is very hesitant because he thinks he will "get shot up with pain meds" and discharged.  He feels this will be expensive but not get him closer to a solution. He is aware that tonight, the ED is the only real option for acute management and evaluation  If he doesn't go to the ED, then he can be seen in the office in urgent visit tomorrow.  Aram Beecham Please call pt and see if he went to ED, if not, please contact him and work in for urgent visit Tuesday, 08/28/12

## 2012-08-27 NOTE — Telephone Encounter (Signed)
I agree

## 2012-08-27 NOTE — Telephone Encounter (Signed)
CT is back and normal, not even any inflammation in bowel; gonna tell pt to go to the ER.

## 2012-08-27 NOTE — Telephone Encounter (Signed)
Informed pt Dr Jarold Motto will not refill his pain meds; wait until we see what CT shows. Pt stated understanding.

## 2012-08-27 NOTE — Telephone Encounter (Signed)
C/o aching lower abd pain.reviewed CT findingsnthat did not show Any acute changes.  Told to go tomER if pain cannot be controlled or to call office in am.

## 2012-08-27 NOTE — Telephone Encounter (Signed)
If he is that sick he needs to go to ER.Marland KitchenMarland Kitchen

## 2012-08-27 NOTE — Telephone Encounter (Signed)
Pt reports his pin is so bad, he can't get out of bed w/o taking 2 Norco q6r,pt was given a script for 50- Norco 10/325 tabs to be taken 1 q6hr; he states at this rate he will be out of pills soon. Pt presently at [facility] CT for 9am scan. Please advise.

## 2012-08-27 NOTE — Telephone Encounter (Signed)
Pt called back and just wants [place] to know, the pain meds helped, he just had to double the dose. His highest temp has been 100 and he does have a scant amount of blood in his stool; stool is loose and watery. Explained his results are still not back, but I will call him.

## 2012-08-28 ENCOUNTER — Observation Stay (HOSPITAL_COMMUNITY)
Admission: AD | Admit: 2012-08-28 | Discharge: 2012-08-29 | Disposition: A | Discharge status: Home / Self Care | Payer: BC Managed Care – PPO | Source: Ambulatory Visit | Attending: Internal Medicine | Admitting: Internal Medicine

## 2012-08-28 ENCOUNTER — Telehealth: Payer: Self-pay | Admitting: Gastroenterology

## 2012-08-28 ENCOUNTER — Encounter: Payer: Self-pay | Admitting: Physician Assistant

## 2012-08-28 ENCOUNTER — Encounter (HOSPITAL_COMMUNITY): Payer: Self-pay | Admitting: General Practice

## 2012-08-28 ENCOUNTER — Ambulatory Visit (INDEPENDENT_AMBULATORY_CARE_PROVIDER_SITE_OTHER): Payer: BC Managed Care – PPO | Admitting: Physician Assistant

## 2012-08-28 ENCOUNTER — Telehealth: Payer: Self-pay | Admitting: Internal Medicine

## 2012-08-28 VITALS — BP 120/80 | HR 88 | Ht 71.75 in | Wt 204.4 lb

## 2012-08-28 DIAGNOSIS — J069 Acute upper respiratory infection, unspecified: Secondary | ICD-10-CM

## 2012-08-28 DIAGNOSIS — E440 Moderate protein-calorie malnutrition: Secondary | ICD-10-CM

## 2012-08-28 DIAGNOSIS — Z8781 Personal history of (healed) traumatic fracture: Secondary | ICD-10-CM

## 2012-08-28 DIAGNOSIS — R52 Pain, unspecified: Secondary | ICD-10-CM

## 2012-08-28 DIAGNOSIS — J45909 Unspecified asthma, uncomplicated: Secondary | ICD-10-CM | POA: Diagnosis present

## 2012-08-28 DIAGNOSIS — I10 Essential (primary) hypertension: Secondary | ICD-10-CM | POA: Diagnosis present

## 2012-08-28 DIAGNOSIS — M549 Dorsalgia, unspecified: Secondary | ICD-10-CM

## 2012-08-28 DIAGNOSIS — R1032 Left lower quadrant pain: Secondary | ICD-10-CM

## 2012-08-28 DIAGNOSIS — J309 Allergic rhinitis, unspecified: Secondary | ICD-10-CM

## 2012-08-28 DIAGNOSIS — F410 Panic disorder [episodic paroxysmal anxiety] without agoraphobia: Principal | ICD-10-CM | POA: Diagnosis present

## 2012-08-28 DIAGNOSIS — B360 Pityriasis versicolor: Secondary | ICD-10-CM

## 2012-08-28 DIAGNOSIS — R1031 Right lower quadrant pain: Secondary | ICD-10-CM

## 2012-08-28 DIAGNOSIS — H5711 Ocular pain, right eye: Secondary | ICD-10-CM

## 2012-08-28 DIAGNOSIS — R300 Dysuria: Secondary | ICD-10-CM

## 2012-08-28 DIAGNOSIS — R310 Gross hematuria: Secondary | ICD-10-CM

## 2012-08-28 DIAGNOSIS — R109 Unspecified abdominal pain: Secondary | ICD-10-CM

## 2012-08-28 LAB — CBC
HCT: 43 % (ref 39.0–52.0)
Hemoglobin: 15.2 g/dL (ref 13.0–17.0)
MCH: 32.5 pg (ref 26.0–34.0)
MCHC: 35.3 g/dL (ref 30.0–36.0)
MCV: 92.1 fL (ref 78.0–100.0)
Platelets: 251 10*3/uL (ref 150–400)
RBC: 4.67 MIL/uL (ref 4.22–5.81)
RDW: 12.4 % (ref 11.5–15.5)
WBC: 7 10*3/uL (ref 4.0–10.5)

## 2012-08-28 LAB — CREATININE, SERUM
Creatinine, Ser: 0.77 mg/dL (ref 0.50–1.35)
GFR calc Af Amer: >90 mL/min (ref >90)
GFR calc non Af Amer: >90 mL/min (ref >90)

## 2012-08-28 LAB — SEDIMENTATION RATE: Sed Rate: 1 mm/hr (ref 0–16)

## 2012-08-28 MED ORDER — KETOROLAC TROMETHAMINE 30 MG/ML IJ SOLN
30.0000 mg | Freq: Four times a day (QID) | INTRAMUSCULAR | Status: DC | PRN
  Administered 2012-08-28: 30 mg via INTRAVENOUS
  Filled 2012-08-28 (×2): qty 1

## 2012-08-28 MED ORDER — METRONIDAZOLE 500 MG PO TABS
500.0000 mg | ORAL_TABLET | Freq: Two times a day (BID) | ORAL | Status: DC
  Administered 2012-08-28 – 2012-08-29 (×3): 500 mg via ORAL
  Filled 2012-08-28 (×5): qty 1

## 2012-08-28 MED ORDER — LOSARTAN POTASSIUM 50 MG PO TABS
50.0000 mg | ORAL_TABLET | Freq: Every day | ORAL | Status: DC
  Administered 2012-08-29: 50 mg via ORAL
  Filled 2012-08-28: qty 1

## 2012-08-28 MED ORDER — ENOXAPARIN SODIUM 40 MG/0.4ML ~~LOC~~ SOLN
40.0000 mg | SUBCUTANEOUS | Status: DC
  Administered 2012-08-28: 40 mg via SUBCUTANEOUS
  Filled 2012-08-28 (×2): qty 0.4

## 2012-08-28 MED ORDER — VITAMIN B-12 1000 MCG PO TABS
1000.0000 ug | ORAL_TABLET | Freq: Every day | ORAL | Status: DC
  Administered 2012-08-28 – 2012-08-29 (×2): 1000 ug via ORAL
  Filled 2012-08-28 (×3): qty 1

## 2012-08-28 MED ORDER — CIPROFLOXACIN HCL 500 MG PO TABS
500.0000 mg | ORAL_TABLET | Freq: Two times a day (BID) | ORAL | Status: DC
  Administered 2012-08-28 – 2012-08-29 (×2): 500 mg via ORAL
  Filled 2012-08-28 (×3): qty 1

## 2012-08-28 MED ORDER — MORPHINE SULFATE 2 MG/ML IJ SOLN
2.0000 mg | INTRAMUSCULAR | Status: DC | PRN
  Administered 2012-08-28 – 2012-08-29 (×4): 2 mg via INTRAVENOUS
  Filled 2012-08-28 (×4): qty 1

## 2012-08-28 MED ORDER — FLUTICASONE PROPIONATE HFA 44 MCG/ACT IN AERO
2.0000 | INHALATION_SPRAY | Freq: Two times a day (BID) | RESPIRATORY_TRACT | Status: DC
  Administered 2012-08-29: 2 via RESPIRATORY_TRACT
  Filled 2012-08-28: qty 10.6
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
## 2012-08-28 NOTE — Telephone Encounter (Signed)
Spoke with pt to give him an appt with Mike Gip, PA; 09:30am appt today.
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
## 2012-08-28 NOTE — Telephone Encounter (Signed)
This sounds more urology assoc problem...needs repeat ER evaaluation with appropriate referrals per their W/U.Marland KitchenMarland Kitchenprobable psych problems also

## 2012-08-29 ENCOUNTER — Telehealth: Payer: Self-pay | Admitting: Physician Assistant

## 2012-08-29 DIAGNOSIS — I10 Essential (primary) hypertension: Secondary | ICD-10-CM

## 2012-08-29 DIAGNOSIS — M549 Dorsalgia, unspecified: Secondary | ICD-10-CM

## 2012-08-29 LAB — CBC
HCT: 44.3 % (ref 39.0–52.0)
Hemoglobin: 15 g/dL (ref 13.0–17.0)
MCH: 31.6 pg (ref 26.0–34.0)
MCHC: 33.9 g/dL (ref 30.0–36.0)
MCV: 93.3 fL (ref 78.0–100.0)
Platelets: 228 10*3/uL (ref 150–400)
RBC: 4.75 MIL/uL (ref 4.22–5.81)
RDW: 12.4 % (ref 11.5–15.5)
WBC: 6.3 10*3/uL (ref 4.0–10.5)

## 2012-08-29 LAB — COMPREHENSIVE METABOLIC PANEL
ALT: 17 U/L (ref 0–53)
AST: 14 U/L (ref 0–37)
Albumin: 3.6 g/dL (ref 3.5–5.2)
Alkaline Phosphatase: 75 U/L (ref 39–117)
BUN: 8 mg/dL (ref 6–23)
CO2: 25 mEq/L (ref 19–32)
Calcium: 9.1 mg/dL (ref 8.4–10.5)
Chloride: 105 mEq/L (ref 96–112)
Creatinine, Ser: 0.81 mg/dL (ref 0.50–1.35)
GFR calc Af Amer: >90 mL/min (ref >90)
GFR calc non Af Amer: >90 mL/min (ref >90)
Glucose, Bld: 106 mg/dL — ABNORMAL HIGH (ref 70–99)
Potassium: 3.6 mEq/L (ref 3.5–5.1)
Sodium: 139 mEq/L (ref 135–145)
Total Bilirubin: 0.2 mg/dL — ABNORMAL LOW (ref 0.3–1.2)
Total Protein: 6.3 g/dL (ref 6.0–8.3)

## 2012-08-29 LAB — C-REACTIVE PROTEIN: CRP: <0.5 mg/dL — ABNORMAL LOW (ref <0.60)

## 2012-08-29 MED ORDER — CYCLOBENZAPRINE HCL 5 MG PO TABS: 5.0000 mg | ORAL_TABLET | Freq: Three times a day (TID) | ORAL | Status: DC | PRN

## 2012-08-29 MED ORDER — ENSURE COMPLETE PO LIQD
237.0000 mL | Freq: Two times a day (BID) | ORAL | Status: DC
  Administered 2012-08-29: 237 mL via ORAL

## 2012-08-29 MED ORDER — PANTOPRAZOLE SODIUM 40 MG PO TBEC
40.0000 mg | DELAYED_RELEASE_TABLET | Freq: Every day | ORAL | Status: DC
  Administered 2012-08-29: 40 mg via ORAL
  Filled 2012-08-29: qty 1

## 2012-08-29 MED ORDER — CYCLOBENZAPRINE HCL 10 MG PO TABS: 5.0000 mg | ORAL_TABLET | Freq: Three times a day (TID) | ORAL | Status: DC | PRN

## 2012-08-29 MED ORDER — HYDROCODONE-ACETAMINOPHEN 10-325 MG PO TABS: 1.0000 | ORAL_TABLET | Freq: Four times a day (QID) | ORAL | Status: DC | PRN

## 2012-08-29 MED ORDER — OXYCODONE-ACETAMINOPHEN 5-325 MG PO TABS
1.0000 | ORAL_TABLET | Freq: Four times a day (QID) | ORAL | Status: DC | PRN
  Administered 2012-08-29: 2 via ORAL
  Filled 2012-08-29: qty 2

## 2012-08-29 NOTE — Progress Notes (Signed)
PATIENT DETAILS Name: Ryan [name] Age: 27 y.o. Sex: male Date of Birth: 1985/05/19 Admit Date: 08/28/2012 Admitting Physician Clydia Llano, MD ZHY:QMVH,QIONGEX [name], MD  Subjective: Back pain and abdominal pain-somewhat better  Assessment/Plan: Active Problems: Abdominal Pain -?etiology -belly is soft -appreciate GI note -doubt further work up is needed while inpatient -do not think he has Diverticulitis Cipro and Flagyl  Back Pain -spoke with Dr Lonie Peak office-his secretary informed me that the patient has been dismissed from their practice due to narcotic issues,and has been referred to pain management.  -Then spoke with Dr Wynetta Emery over the phone-who suggested-we pursue another MRI of LS and Thoracic spine -I then spoke with patient and mother at bedside, explained Dr Lonie Peak recommendations-patient at first was very hesitant to pursue another MRI-as he just had one done last month. But I did tell him that this was our recommendation, he then subsequently agreed. Apparently he has a appointment for a epidural injection at [facility] tomorrow and he would like to see them first before proceeding with this  -He has good strength in all 4 extremities -Possible discharge later today-if MRI spine is negative  Disposition: Remain inpatient  DVT Prophylaxis: Prophylactic Lovenox   Code Status: Full code  Family Communication Mother at bedside  Procedures:  None  CONSULTS:  Neurosurgery and GI   MEDICATIONS: Scheduled Meds: . ciprofloxacin  500 mg Oral BID  . enoxaparin (LOVENOX) injection  40 mg Subcutaneous Q24H  . feeding supplement  237 mL Oral BID BM  . fluticasone  2 puff Inhalation BID  . losartan  50 mg Oral Daily  . metroNIDAZOLE  500 mg Oral BID  . pantoprazole  40 mg Oral Q1200  . vitamin B-12  1,000 mcg Oral Daily   Continuous Infusions:  PRN Meds:.cyclobenzaprine, ketorolac, oxyCODONE-acetaminophen  Antibiotics: Anti-infectives   Start     Dose/Rate Route Frequency Ordered Stop   08/28/12 2000  ciprofloxacin (CIPRO) tablet 500 mg     500 mg Oral 2 times daily 08/28/12 1337     08/28/12 1500  metroNIDAZOLE (FLAGYL) tablet 500 mg     500 mg Oral 2 times daily 08/28/12 1337         PHYSICAL EXAM: Vital signs in last 24 hours: Filed Vitals:   08/28/12 1706 08/28/12 2045 08/29/12 0628 08/29/12 0903  BP:  98/58 124/62   Pulse:  53 54   Temp:  97.7 F (36.5 C) 98.2 F (36.8 C)   TempSrc:  Oral Oral   Resp:  18 16   Height: 5\' 11"  (1.803 m)     Weight: 92.8 kg (204 lb 9.4 oz)     SpO2:  95% 99% 99%    Weight change:  Filed Weights   08/28/12 1706  Weight: 92.8 kg (204 lb 9.4 oz)   Body mass index is 28.55 kg/(m^2).   Gen Exam: Awake and alert with clear speech.   Neck: Supple, No JVD.  Chest: B/L Clear.   CVS: S1 S2 Regular, no murmurs.  Abdomen: soft, BS +, non tender, non distended.  Extremities: no edema, lower extremities warm to touch. Neurologic: Non Focal.  Skin: No Rash.   Wounds: N/A.    Intake/Output from previous day:  Intake/Output Summary (Last 24 hours) at 08/29/12 1129 Last data filed at 08/29/12 0946  Gross per 24 hour  Intake    240 ml  Output    150 ml  Net     90 ml     LAB  RESULTS: CBC  Recent Labs Lab 08/23/12 1418 08/24/12 0951 08/28/12 1407 08/29/12 0535  WBC 12.0* 8.4 7.0 6.3  HGB 15.8 15.9 15.2 15.0  HCT 45.4 45.2 43.0 44.3  PLT 275.0 279.0 251 228  MCV 93.9 92.2 92.1 93.3  MCH  --   --  32.5 31.6  MCHC 34.7 35.2 35.3 33.9  RDW 12.8 12.6 12.4 12.4  LYMPHSABS 2.8 2.6  --   --   MONOABS 0.7 0.6  --   --   EOSABS 0.0 0.1  --   --   BASOSABS 0.1 0.1  --   --     Chemistries   Recent Labs Lab 08/23/12 1418 08/24/12 0951 08/28/12 1407 08/29/12 0535  NA 137 139  --  139  K 3.7 4.1  --  3.6  CL 106 108  --  105  CO2 23 24  --  25  GLUCOSE 76 91  --  106*  BUN 9 9  --  8  CREATININE 0.8 0.9 0.77 0.81  CALCIUM 9.3 9.7  --  9.1    CBG: No  results found for this basename: GLUCAP,  in the last 168 hours  GFR Estimated Creatinine Clearance: 156.6 ml/min (by C-G formula based on Cr of 0.81).  Coagulation profile No results found for this basename: INR, PROTIME,  in the last 168 hours  Cardiac Enzymes No results found for this basename: CK, CKMB, TROPONINI, MYOGLOBIN,  in the last 168 hours  No components found with this basename: POCBNP,  No results found for this basename: DDIMER,  in the last 72 hours No results found for this basename: HGBA1C,  in the last 72 hours No results found for this basename: CHOL, HDL, LDLCALC, TRIG, CHOLHDL, LDLDIRECT,  in the last 72 hours No results found for this basename: TSH, T4TOTAL, FREET3, T3FREE, THYROIDAB,  in the last 72 hours No results found for this basename: VITAMINB12, FOLATE, FERRITIN, TIBC, IRON, RETICCTPCT,  in the last 72 hours No results found for this basename: LIPASE, AMYLASE,  in the last 72 hours  Urine Studies No results found for this basename: UACOL, UAPR, USPG, UPH, UTP, UGL, UKET, UBIL, UHGB, UNIT, UROB, ULEU, UEPI, UWBC, URBC, UBAC, CAST, CRYS, UCOM, BILUA,  in the last 72 hours  MICROBIOLOGY: No results found for this or any previous visit (from the past 240 hour(s)).  RADIOLOGY STUDIES/RESULTS: Ct Abdomen Pelvis Wo Contrast  08/11/2012   *RADIOLOGY REPORT*  Clinical Data: Flank pain and hematuria.  Evaluate for kidney stones.  CT ABDOMEN AND PELVIS WITHOUT CONTRAST  Technique:  Multidetector CT imaging of the abdomen and pelvis was performed following the standard protocol without intravenous contrast.  Comparison: CT abdomen pelvis 06/01/2009  Findings: There is dependent atelectasis in both lower lobes. Negative for pleural or pericardial effusion.  There are bilateral nonobstructing renal calculi.  Two stones are seen in the right kidney, the largest is in the lower pole measuring approximately 2 mm.  Three stones are seen in the left kidney, nonobstructing.  The  largest is in the lower pole measuring 4 mm.  Bilateral extrarenal pelves, a normal anatomic variant, are noted and stable.  Negative for hydronephrosis or perinephric stranding/fluid collection.  Both ureters are normal in caliber.  No ureteral stone or periureteric stranding.  The urinary bladder is normal.  Negative for bladder stones.  Surgical suture is noted at the base of the cecum, suggesting prior appendectomy.  An appendix is not visualized.  There are scattered  colonic diverticula throughout the colon.  No evidence of acute diverticulitis.  The noncontrast appearance of the liver, gallbladder, spleen, adrenal glands, and pancreas is within normal limits.  The abdominal aorta is normal in caliber.  Negative for lymphadenopathy, ascites, or free air.  There is mild convex left scoliosis of the lumbar spine.  No acute bony abnormality is identified.  IMPRESSION:  1.  Several small bilateral renal calculi as described above. 2.  No evidence of hydronephrosis, ureteral stone, or recent stone passage. 3.  Colonic diverticulosis, uncomplicated.   Original Report Authenticated By: Britta Mccreedy, M.D.   Ct Abdomen Pelvis W Contrast  08/27/2012   *RADIOLOGY REPORT*  Clinical Data: Left lower quadrant pain for 1 month.  Blood in stool  CT ABDOMEN AND PELVIS WITH CONTRAST  Technique:  Multidetector CT imaging of the abdomen and pelvis was performed following the standard protocol during bolus administration of intravenous contrast.  Contrast: OMNIPAQUE IOHEXOL 300 MG/ML  SOLN  Comparison: CT 08/11/2012  Findings: Lung bases are clear.  No pericardial fluid.  No focal hepatic lesion.  The gallbladder, pancreas, spleen, adrenal glands, and kidneys are normal.  The stomach, small bowel, and cecum are normal.  Patient status post appendectomy.  There are several diverticula of the proximal sigmoid colon without acute inflammation.  Abdominal aorta is normal caliber.  No retroperitoneal periportal lymphadenopathy.   There is no free fluid the pelvis.  No pelvic lymphadenopathy. Review of  bone windows demonstrates no aggressive osseous lesions.  IMPRESSION:  1.  No acute abdominal or pelvic findings. 2.  Minimal diverticular disease through the sigmoid colon. 3.  Post appendectomy.   Original Report Authenticated By: Genevive Bi, M.D.    Jeoffrey Massed, MD  [facility] Pager:[phone]  If 7PM-7AM, please contact night-coverage [email or web address] Password [email or web address] 08/29/2012, 11:29 AM   LOS: 1 day

## 2012-08-29 NOTE — Progress Notes (Signed)
Spoke with patient at bedside, he has changed his mind again, he does not wish to pursue a MRI of his spine that was ordered this am by me after speaking with Dr Wynetta Emery. He will follow up with [facility] tomorrow, and will talk with the MD there before deciding on whether to pursue another MRI. I spoke with his mother Maryellen Pile make sure if the family was in agreement, she said that the patient's father, herself and patient are on the same page, they do not think the MRI will show anything different. They would like for the patient to be discharged, they do not want to wait for Dr Wynetta Emery to come see the patient as well. I asked them if they would reconsider, how ever they had made up their minds and were wanting to be discharged today. I did ask them to let us know if they reconsidered. Upon further speaking with the patient, he did say that financial considerations were the main factor.
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
## 2012-08-29 NOTE — Telephone Encounter (Signed)
Called the patient and he said he was discharged from the hospital today.  He said they didn't find anything regarding the abdomen.  I told him that per Amy Esterwood PA-C, the pain in the abdomen seems to be coming from the back. She said we do not plan on doing any more test for the belly/abdomen at this time.   I urged him to keep his appointment with [facility] tomorrow, 08-30-2012 for the epidural injection.  MRI of the spine ordered.  He said he was at the pharmacy to pick up the Vicodin 10/325 mg and they will not fill it.  He had another vicodin RX he had filled recently and he was told by a doctor on call last Friday night , to take 2 tab at one time if he needs to.  He has done that.  It must be too soon for him to get this 2nd script filled.
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
## 2012-08-29 NOTE — Progress Notes (Signed)
Pt. discharged to floor,verbalized understanding of discharged instruction,medication,restriction,diet and follow up appointment.Baseline Vitals sign stable,Pt comfortable,no sign and symptom of distress. 

## 2012-08-29 NOTE — Progress Notes (Signed)
Patient ID: Ryan [name], male   DOB: 12-Mar-1986, 27 y.o.   MRN: [id number]   Please see the GI office progress note from 6/2-   Pt's current problems started acutely 4 days after undergoing an epidural  Of lumbar spine on 08/03/2012 at [facility] pain clinic. Please pursue an MRI of lumbar spine to r/o sxs secondary to complication of epidural -with pelvic nerve involvement, and consider neurosurgery consult. Thank-you.

## 2012-08-29 NOTE — Care Management Note (Unsigned)
    Page 1 of 1   08/29/2012     11:05:44 AM   CARE MANAGEMENT NOTE 08/29/2012  Patient:  Ryan [name], Ryan [name]   Account Number:  [id number]  Date Initiated:  08/29/2012  Documentation initiated by:  Letha Cape  Subjective/Objective Assessment:   dx panic attack  admit as observation- pta indep, lives with girlfriend.     Action/Plan:   Anticipated DC Date:  08/29/2012   Anticipated DC Plan:  HOME/SELF CARE      DC Planning Services  CM consult      Choice offered to / List presented to:             Status of service:  In process, will continue to follow Medicare Important Message given?   (If response is "NO", the following Medicare IM given date fields will be blank) Date Medicare IM given:   Date Additional Medicare IM given:    Discharge Disposition:    Per UR Regulation:    If discussed at Long Length of Stay Meetings, dates discussed:    Comments:  08/29/12 11:04 Letha Cape RN, BSN [phone] patient lives with girlfriend, pta indep.  Patient has medication coverage and transportation.  No needs anticipated.

## 2012-08-29 NOTE — Progress Notes (Signed)
Patient refused to get an MRI,due to some Financial issue and conversation to MD,about the same possible result.Md Ghimire Notified

## 2012-09-01 ENCOUNTER — Telehealth: Payer: Self-pay | Admitting: Family Medicine

## 2012-09-01 NOTE — Telephone Encounter (Signed)
Patient Information:  Caller Name: [place]  Phone: [phone]  Patient: Ryan [name]  Gender: Male  DOB: 08/31/1985  Age: 27 Years  PCP: Oliver Barre (Adults only)  Office Follow Up:  Does the office need to follow up with this patient?: No  Instructions For The Office: N/A  RN Note:  Is going to have father to check his blood pressure and pulse since he does not have equipment or know how to do so.  Plan call back to patient in 40-60 minutes to check situation.  Symptoms  Reason For Call & Symptoms: Feeling jittery, thinks may be starting withdrawal since stopped narcotics yesterday 6/6.  Not sleeping well last night - only 1.5 hours.  Some dizziness, diarrhea, nausea today. Feeling slightly confused this am.  Reviewed Health History In EMR: Yes  Reviewed Medications In EMR: Yes  Reviewed Allergies In EMR: Yes  Reviewed Surgeries / Procedures: Yes  Date of Onset of Symptoms: 08/31/2012  Treatments Tried: stopped all narcotics after last dose yesterday 6/6 about 6 pm  Treatments Tried Worked: No  Guideline(s) Used:  Weakness (Generalized) and Fatigue  Disposition Per Guideline:   See Today in Office  Reason For Disposition Reached:   Taking a medicine that could cause weakness (e.g., blood pressure medications, diuretics)  Advice Given:  N/A  Patient Refused Recommendation:  Patient Refused Care Advice  Does not want to get immediate care in ER  Plan call back to patient to check progress oin 40-60 minutes

## 2012-09-04 NOTE — Telephone Encounter (Signed)
Pt would like you to call him about these side effects. Pt is also depressed. Pt feels like his urine is making him "smell". Concerned about the 5 kidney stones also. Pls advise.

## 2012-09-04 NOTE — Telephone Encounter (Signed)
We recommend at this point that he go to behavior health or if your he has a psychologist or psychiatrist see them ASAP

## 2012-09-04 NOTE — Telephone Encounter (Signed)
Spoke with Mom

## 2012-09-07 ENCOUNTER — Telehealth: Payer: Self-pay | Admitting: Family Medicine

## 2012-09-07 DIAGNOSIS — N200 Calculus of kidney: Secondary | ICD-10-CM

## 2012-09-07 DIAGNOSIS — M549 Dorsalgia, unspecified: Secondary | ICD-10-CM

## 2012-09-07 DIAGNOSIS — Z8781 Personal history of (healed) traumatic fracture: Secondary | ICD-10-CM

## 2012-09-07 NOTE — Telephone Encounter (Signed)
PT called back and stated that he needs a referral for [name] urology, and a neurologist. Please assist.

## 2012-09-07 NOTE — Telephone Encounter (Signed)
Pt would like to know he needs to be seeing the urologist..Marland KitchenMarland KitchenCould an epidural or spine injury cause the 'dribbling' pt is experiecing. Pt had started the dribbling 3 days after epidural.  Pt is wondering if he may need to see a neurlogist? Pt feels like he is going in wrong direction w/ his MDs.  Pls advise.

## 2012-09-08 ENCOUNTER — Encounter: Payer: Self-pay | Admitting: Family Medicine

## 2012-09-08 ENCOUNTER — Ambulatory Visit (INDEPENDENT_AMBULATORY_CARE_PROVIDER_SITE_OTHER): Payer: BC Managed Care – PPO | Admitting: Family Medicine

## 2012-09-08 VITALS — BP 138/86 | HR 92 | Temp 97.4°F | Wt 197.0 lb

## 2012-09-08 DIAGNOSIS — R300 Dysuria: Secondary | ICD-10-CM

## 2012-09-08 DIAGNOSIS — R319 Hematuria, unspecified: Secondary | ICD-10-CM

## 2012-09-08 LAB — POCT URINALYSIS DIPSTICK
Bilirubin, UA: NEGATIVE
Glucose, UA: NEGATIVE
Ketones, UA: NEGATIVE
Leukocytes, UA: NEGATIVE
Nitrite, UA: NEGATIVE
Spec Grav, UA: 1.025
Urobilinogen, UA: 0.2
pH, UA: 6

## 2012-09-08 NOTE — Progress Notes (Signed)
He had a history of hematuria and thought he passed one (FH of renal stones noted).  He's had a feeling of urethral irritation and still with some dribbling after (but not before) urination.  He had f/u with urology yesterday.  He was stated on oxybutinin recently and had some samples of vesicare recently.  He has a dry mouth since starting those meds.  The dribbling didn't get better with the OAB meds yet, but this is a recent start.  He is call back to urology on Monday.  No fevers.    He was asking about antibiotics for the urethral irritation that persists throughout the day.  No h/o STDs.  Monogamous.    Meds, vitals, and allergies reviewed.   ROS: See HPI.  Otherwise, noncontributory.  nad ncat Mmm rrr ctab abd soft, not ttp Ext w/o edema.  Normal ext genitalia exam except for what may be a slightly stenotic urethral meatus.

## 2012-09-08 NOTE — Assessment & Plan Note (Signed)
Urethral changes noted on exam. That could cause some of the symptoms.  Check ucx.  He'll need f/u for hematuria and for the dysuria.  He'll call PCP Monday.

## 2012-09-08 NOTE — Patient Instructions (Signed)
Call urology and your primary doc on Monday.  We'll check your urine culture in the meantime.  I wouldn't change your med for now.  Take care.

## 2012-09-10 ENCOUNTER — Telehealth: Payer: Self-pay | Admitting: Family Medicine

## 2012-09-10 NOTE — Telephone Encounter (Signed)
[facility] Triage Call Report Triage Record Num: [id number] Operator: Donnella Sham Patient Name: Ryan [name] Call Date & Time: 09/07/2012 9:58:30PM Patient Phone: [phone] PCP: Eugenio Hoes. Todd Patient Gender: Male PCP Fax : [phone] Patient DOB: 16-May-1985 [facility] Name: [facility] Reason for Call: Caller: [name]; PCP: Kelle Darting (Family Practice); CB#: [phone]; Call regarding Urinary Pain/Bleeding; onset 27-Aug-2012; has passed 3 stones, but still have 5 left; has seen urologist 3x within the past month; placed on Oxybutynin, Uribel and Vesicare for overactive bladder; had blood in his urine at every appt within the past month; c/o discomfort/tenderness; having leaking; frequency; slight lt flank pain; would like appt because he doesn't feel that the urologist is helping him; All emergent sxs of Flank Pain protocol r/o except "recurrent pain that requires repeated pain meds for relief"; disp see within 24hrs; appt scheduled for 6/14 at 1030 at [facility] with Dr.Duncan Protocol(s) Used: Flank Pain Protocol(s) Used: Urinary Symptoms - Male Recommended Outcome per Protocol: See Provider within 24 hours Reason for Outcome: Flank pain Recurrent pain that requires repeated pain medications for relief Care Advice: ~ SYMPTOM / CONDITION MANAGEMENT Increase intake of fluids. Try to drink 8 oz. (.2 liter) every hour when awake, unless on restricted fluids for other medical reasons. Include at least two 8 oz. (.2 liter) glasses of unsweetened cranberry juice each day. Take sips of fluid or eat ice chips if nauseated or vomiting. ~ 06/

## 2012-09-11 ENCOUNTER — Other Ambulatory Visit: Payer: Self-pay | Admitting: *Deleted

## 2012-09-11 MED ORDER — LORAZEPAM 0.5 MG PO TABS: 0.5000 mg | ORAL_TABLET | Freq: Every day | ORAL | Status: DC | PRN

## 2012-09-13 ENCOUNTER — Telehealth: Payer: Self-pay | Admitting: *Deleted

## 2012-09-13 MED ORDER — LORAZEPAM 1 MG PO TABS: 1.0000 mg | ORAL_TABLET | Freq: Three times a day (TID) | ORAL | Status: DC | PRN

## 2012-09-13 NOTE — Telephone Encounter (Signed)
Okay per Dr Tawanna Cooler for 1 month and then he should be seen by behavioral health

## 2012-09-13 NOTE — Telephone Encounter (Signed)
Spoke with patient.  He verbally understands that he will need to seek behavior health for more refills.  rx called in.

## 2012-09-13 NOTE — Telephone Encounter (Signed)
Patient has been taking his Ativan that he had a refill of.  He states that he feels better.  However he is taking it three times a day.  He would like to know if this is okay?

## 2012-09-19 ENCOUNTER — Ambulatory Visit: Payer: BC Managed Care – PPO | Admitting: Family Medicine

## 2012-09-20 ENCOUNTER — Telehealth: Payer: Self-pay | Admitting: Family Medicine

## 2012-09-20 MED ORDER — BENZONATATE 200 MG PO CAPS: 200.0000 mg | ORAL_CAPSULE | Freq: Three times a day (TID) | ORAL | Status: DC | PRN

## 2012-09-20 NOTE — Telephone Encounter (Signed)
Per Dr Tawanna Cooler prescription will be called in. Tessalon pearls.  2 tylenol three times daily as needed. Left message on machine for patient

## 2012-09-20 NOTE — Telephone Encounter (Signed)
Pt requesting appt today. Has to work at [employer], would like to be seen ASAP. Has sore throat, congestion, productive cough, doesn't know if he has fever.

## 2012-09-24 ENCOUNTER — Telehealth: Payer: Self-pay | Admitting: Family Medicine

## 2012-09-24 NOTE — Telephone Encounter (Signed)
Left message on machine for patient to return our call 

## 2012-09-24 NOTE — Telephone Encounter (Signed)
Pt states he has severe congestion, cough, pt would like to come in ASAP or see someone else. Pls advise

## 2012-09-25 ENCOUNTER — Ambulatory Visit: Payer: BC Managed Care – PPO | Admitting: Family Medicine

## 2012-09-25 ENCOUNTER — Ambulatory Visit (INDEPENDENT_AMBULATORY_CARE_PROVIDER_SITE_OTHER): Payer: BC Managed Care – PPO | Admitting: Internal Medicine

## 2012-09-25 ENCOUNTER — Encounter: Payer: Self-pay | Admitting: Internal Medicine

## 2012-09-25 VITALS — BP 140/90 | HR 76 | Temp 97.9°F | Resp 18 | Ht 72.0 in | Wt 200.0 lb

## 2012-09-25 DIAGNOSIS — J069 Acute upper respiratory infection, unspecified: Secondary | ICD-10-CM

## 2012-09-25 DIAGNOSIS — J45909 Unspecified asthma, uncomplicated: Secondary | ICD-10-CM

## 2012-09-25 DIAGNOSIS — J309 Allergic rhinitis, unspecified: Secondary | ICD-10-CM

## 2012-09-25 DIAGNOSIS — J4521 Mild intermittent asthma with (acute) exacerbation: Secondary | ICD-10-CM

## 2012-09-25 DIAGNOSIS — J45901 Unspecified asthma with (acute) exacerbation: Secondary | ICD-10-CM

## 2012-09-25 MED ORDER — AZITHROMYCIN 250 MG PO TABS: ORAL_TABLET | ORAL | Status: DC

## 2012-09-25 MED ORDER — ALBUTEROL SULFATE HFA 108 (90 BASE) MCG/ACT IN AERS: 2.0000 | INHALATION_SPRAY | Freq: Four times a day (QID) | RESPIRATORY_TRACT | Status: DC | PRN

## 2012-09-25 NOTE — Telephone Encounter (Signed)
Appointment given.

## 2012-09-25 NOTE — Patient Instructions (Signed)
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
## 2012-11-03 ENCOUNTER — Other Ambulatory Visit: Payer: Self-pay | Admitting: Family Medicine

## 2012-11-05 ENCOUNTER — Ambulatory Visit (INDEPENDENT_AMBULATORY_CARE_PROVIDER_SITE_OTHER): Payer: BC Managed Care – PPO | Admitting: Internal Medicine

## 2012-11-05 ENCOUNTER — Encounter: Payer: Self-pay | Admitting: Internal Medicine

## 2012-11-05 VITALS — BP 120/80 | HR 99 | Temp 97.6°F | Wt 215.0 lb

## 2012-11-05 DIAGNOSIS — L02219 Cutaneous abscess of trunk, unspecified: Secondary | ICD-10-CM

## 2012-11-05 DIAGNOSIS — L02214 Cutaneous abscess of groin: Secondary | ICD-10-CM

## 2012-11-05 DIAGNOSIS — L03319 Cellulitis of trunk, unspecified: Secondary | ICD-10-CM

## 2012-11-05 MED ORDER — SULFAMETHOXAZOLE-TMP DS 800-160 MG PO TABS: 1.0000 | ORAL_TABLET | Freq: Two times a day (BID) | ORAL | Status: DC
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
## 2012-11-05 NOTE — Patient Instructions (Signed)
Perineal Abscess  An abscess is a sac filled infection. The perineum is the area between the anus and the scrotum in the male and between the anus and the vulva (the labial opening to the vagina) in the male. The size of the abscess varies. If it remains open and begins draining, it can form a fistula. A fistula is a tract that drains the abscess to the surface. The peak incidence of this problem is in the third to fourth decades of life. Men are affected more frequently than women.   CAUSES   There are numerous causes for this type of infection.  In women   The spread of cancer from pelvic organs such as the uterus and ovaries or the spread of infection from special glands located near the vagina.   Problems that occur at the time or soon after delivering a baby:   Infected lochia (the fluid that weeps from the vagina for a week or so after delivery of a baby).   Stool contaminating an episiotomy (a surgical procedure for widening the outlet of the birth canal to facilitate delivery of the baby and to avoid a jagged rip of the perineum).   Poor hygiene could all contribute to forming an abscess of the perineum.  In men   Infection of the prostate gland or the spread of cancer from the prostate.  In men and women   Complication of surgery to the rectum.   Diseases of the colon (such as Crohn's disease).   The spread of cancer from the rectum.   It is also more common when the immune system is depressed:   Patients receiving chemotherapy.   Patients with AIDS.  SYMPTOMS   Pain, swelling, and redness in the area of the abscess are the most frequent symptoms. The redness may go beyond the abscess and appear as a red streak on the skin. There is often a visible lump or a lump that can be felt when touching the area. Bleeding, pus-like discharge, fevers, and general weakness may also be present.   DIAGNOSIS   An examination by your caregiver will establish this ailment. A digital rectal exam, and in women,  a careful vaginal exam, is sometimes necessary. Sometimes looking into your rectum with a special instrument is needed.   TREATMENT   Incision and drainage is one common treatment of an abscess. This can sometimes be done in an office, emergency department, or surgical center with a local anesthetic. For larger or deeper abscesses, an operation is required to drain the abscess. Antibiotics are sometimes used if there is cellulitis (infection of the surrounding tissue). Antibiotics are medications that kill bacteria germs. Antibiotics may also be used if there are immune problems or heart valve problems.   Packing of the wound is sometimes done to produce good drainage. Frequent sitz baths may be recommended to help the wound heal in from the base and sides so that the risk of continued infection and recurrence is reduced.  HOME CARE INSTRUCTIONS    If a gauze pack is used in the abscess, it can usually be removed in 2 to 3 days or as directed.   If one or more drains have been placed in the abscess cavity, be careful not to pull at them. Your caregiver will tell you how long they need to remain in place.   Use warm sitz baths 3 to 4 times per day and after bowel movements.   Keep the skin around the abscess   clean and dry. Avoid cleaning the area too much or scratching.   Avoid using colored or perfumed toilet papers.   Use pain medication, antibiotics, or other medications that your caregiver tells you to use.  SEEK MEDICAL CARE IF:    You have problems having a bowel movement or passing your urine.   The gauze packing or the drain(s) come out before the planned time.   Swelling or pain in the affected area does not seem to be improving as expected.   You feel you have side effects from prescribed medication.  SEEK IMMEDIATE MEDICAL CARE IF:    You develop problems moving or using your legs.   You develop severe or increasing pain.   Swelling of the affected area suddenly gets worse.   You have  increased bleeding or passing of pus.   You develop chills or fever above 101 F (38.3 C).  MAKE SURE YOU:    Understand these instructions.   Will watch your condition.   Will get help right away if you are not doing well or get worse.  Document Released: 04/20/2006 Document Revised: 06/06/2011 Document Reviewed: 05/21/2007  [employer] Patient Information 2014 [employer].

## 2012-11-06 ENCOUNTER — Telehealth: Payer: Self-pay | Admitting: *Deleted

## 2012-11-06 NOTE — Telephone Encounter (Signed)
Allow it to drain on its own.

## 2012-11-06 NOTE — Telephone Encounter (Signed)
Pt called requesting whether he is to "bust" cyst with a needle/pin once it becomes soft or to allow it to drain on its own.  Please advise

## 2012-11-06 NOTE — Telephone Encounter (Signed)
Spoke with pt advised of message. 

## 2012-11-07 ENCOUNTER — Telehealth: Payer: Self-pay | Admitting: Family Medicine

## 2012-11-07 NOTE — Telephone Encounter (Signed)
Spoke with pt, he is going to try his PCPs office.

## 2012-11-07 NOTE — Telephone Encounter (Signed)
Pt return call stated that the cyst has not gone away and now it is sore. Please advise.

## 2012-11-07 NOTE — Telephone Encounter (Signed)
If he wants to come back in we can try to drain it

## 2012-11-09 ENCOUNTER — Ambulatory Visit (INDEPENDENT_AMBULATORY_CARE_PROVIDER_SITE_OTHER): Payer: BC Managed Care – PPO | Admitting: Family Medicine

## 2012-11-09 ENCOUNTER — Encounter: Payer: Self-pay | Admitting: Family Medicine

## 2012-11-09 VITALS — BP 118/88 | HR 88 | Temp 97.5°F | Wt 216.0 lb

## 2012-11-09 DIAGNOSIS — L02214 Cutaneous abscess of groin: Secondary | ICD-10-CM

## 2012-11-09 DIAGNOSIS — L02219 Cutaneous abscess of trunk, unspecified: Secondary | ICD-10-CM
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
## 2012-11-09 NOTE — Patient Instructions (Addendum)
Finish out antibiotic Remove bandage tomorrow and start warm baths Remove packing tomorrow. Apply topical antibiotic such as neosporin for a couple of days.

## 2013-01-15 ENCOUNTER — Ambulatory Visit (INDEPENDENT_AMBULATORY_CARE_PROVIDER_SITE_OTHER): Payer: BC Managed Care – PPO | Admitting: Family Medicine

## 2013-01-15 ENCOUNTER — Encounter: Payer: Self-pay | Admitting: Family Medicine

## 2013-01-15 VITALS — BP 120/84 | Temp 97.8°F | Wt 224.0 lb

## 2013-01-15 DIAGNOSIS — J069 Acute upper respiratory infection, unspecified: Secondary | ICD-10-CM

## 2013-01-15 DIAGNOSIS — J45909 Unspecified asthma, uncomplicated: Secondary | ICD-10-CM

## 2013-01-15 MED ORDER — HYDROCODONE-HOMATROPINE 5-1.5 MG/5ML PO SYRP: 5.0000 mL | ORAL_SOLUTION | Freq: Three times a day (TID) | ORAL | Status: DC | PRN

## 2013-01-15 MED ORDER — DOXYCYCLINE HYCLATE 100 MG PO TABS: 100.0000 mg | ORAL_TABLET | Freq: Two times a day (BID) | ORAL | Status: DC

## 2013-01-15 MED ORDER — PREDNISONE 20 MG PO TABS: ORAL_TABLET | ORAL | Status: DC

## 2013-01-15 NOTE — Progress Notes (Signed)
  Subjective:    Patient ID: Ryan [name], male    DOB: [birth year], 27 y.o.   MRN: [id number]  HPI Ryan [name] is a 27 year old single male nonsmoker,,,,,,,,,, he lives with his girlfriend smokes in the house,,, who comes in with a four-day history of a congestion sore throat and cough.  His history of underlying asthma he uses Qvar 40 dose 2 puffs twice daily albuterol 1 or 2 puffs 3 or 4 times daily. 6  He's also developed severe pain in his right ear and the last 24-hour   Review of Systems    review of systems otherwise negative Objective:   Physical Exam  Well-developed well-nourished male no acute distress she's afebrile he does smell of cigarette smoke. He says he doesn't smoke is from his girlfriend  HEENT negative except for right TM red and swollen bulging neck was supple lungs are clear      Assessment & Plan:  Viral syndrome with secondary right otitis media plan see orders

## 2013-01-15 NOTE — Patient Instructions (Signed)
Rest at home today Wednesday Thursday  Okay to go back to work on Friday  Drink lots of water  Absolutely no smoke exposure  Hydromet,,,,,,, 1/2-1 teaspoon 3 times daily. For cough  Doxycycline twice daily for the ear infection  Prednisone as directed for the wheezing  Return when necessary

## 2013-01-20 ENCOUNTER — Encounter (HOSPITAL_COMMUNITY): Payer: Self-pay | Admitting: Emergency Medicine

## 2013-01-20 ENCOUNTER — Emergency Department (HOSPITAL_COMMUNITY)
Admission: EM | Admit: 2013-01-20 | Discharge: 2013-01-20 | Disposition: A | Discharge status: Home / Self Care | Payer: BC Managed Care – PPO | Source: Home / Self Care | Attending: Family Medicine | Admitting: Family Medicine

## 2013-01-20 ENCOUNTER — Ambulatory Visit (HOSPITAL_COMMUNITY)
Admit: 2013-01-20 | Discharge: 2013-01-20 | Disposition: A | Discharge status: Home / Self Care | Payer: BC Managed Care – PPO | Attending: Internal Medicine | Admitting: Internal Medicine

## 2013-01-20 DIAGNOSIS — R42 Dizziness and giddiness: Secondary | ICD-10-CM

## 2013-01-20 DIAGNOSIS — R509 Fever, unspecified: Secondary | ICD-10-CM | POA: Insufficient documentation

## 2013-01-20 DIAGNOSIS — R0789 Other chest pain: Secondary | ICD-10-CM | POA: Insufficient documentation

## 2013-01-20 DIAGNOSIS — R059 Cough, unspecified: Secondary | ICD-10-CM | POA: Insufficient documentation

## 2013-01-20 DIAGNOSIS — J189 Pneumonia, unspecified organism: Secondary | ICD-10-CM

## 2013-01-20 DIAGNOSIS — R05 Cough: Secondary | ICD-10-CM | POA: Insufficient documentation

## 2013-01-20 LAB — BASIC METABOLIC PANEL
BUN: 14 mg/dL (ref 6–23)
CO2: 21 mEq/L (ref 19–32)
Calcium: 8.7 mg/dL (ref 8.4–10.5)
Chloride: 103 mEq/L (ref 96–112)
Creatinine, Ser: 0.83 mg/dL (ref 0.50–1.35)
GFR calc Af Amer: >90 mL/min (ref >90)
GFR calc non Af Amer: >90 mL/min (ref >90)
Glucose, Bld: 82 mg/dL (ref 70–99)
Potassium: 3.5 mEq/L (ref 3.5–5.1)
Sodium: 138 mEq/L (ref 135–145)

## 2013-01-20 LAB — POCT RAPID STREP A: Streptococcus, Group A Screen (Direct): NEGATIVE

## 2013-01-20 LAB — CBC WITH DIFFERENTIAL/PLATELET
Basophils Absolute: 0.1 10*3/uL (ref 0.0–0.1)
Basophils Relative: 1 % (ref 0–1)
Eosinophils Absolute: 0.2 10*3/uL (ref 0.0–0.7)
Eosinophils Relative: 2 % (ref 0–5)
HCT: 44.7 % (ref 39.0–52.0)
Hemoglobin: 15.8 g/dL (ref 13.0–17.0)
Lymphocytes Relative: 48 % — ABNORMAL HIGH (ref 12–46)
Lymphs Abs: 5.6 10*3/uL — ABNORMAL HIGH (ref 0.7–4.0)
MCH: 32.4 pg (ref 26.0–34.0)
MCHC: 35.3 g/dL (ref 30.0–36.0)
MCV: 91.8 fL (ref 78.0–100.0)
Monocytes Absolute: 0.9 10*3/uL (ref 0.1–1.0)
Monocytes Relative: 8 % (ref 3–12)
Neutro Abs: 4.8 10*3/uL (ref 1.7–7.7)
Neutrophils Relative %: 42 % — ABNORMAL LOW (ref 43–77)
Platelets: 262 10*3/uL (ref 150–400)
RBC: 4.87 MIL/uL (ref 4.22–5.81)
RDW: 12.4 % (ref 11.5–15.5)
WBC: 11.5 10*3/uL — ABNORMAL HIGH (ref 4.0–10.5)

## 2013-01-20 MED ORDER — PSEUDOEPH-HYDROCODONE 60-5 MG/5ML PO SOLN: ORAL | Status: DC

## 2013-01-20 MED ORDER — IPRATROPIUM-ALBUTEROL 0.5-2.5 (3) MG/3ML IN SOLN
3.0000 mL | Freq: Once | RESPIRATORY_TRACT | Status: AC
  Administered 2013-01-20: 3 mL via RESPIRATORY_TRACT

## 2013-01-20 MED ORDER — ALBUTEROL SULFATE (5 MG/ML) 0.5% IN NEBU
INHALATION_SOLUTION | RESPIRATORY_TRACT | Status: AC
  Filled 2013-01-20: qty 0.5

## 2013-01-20 MED ORDER — GUAIFENESIN ER 1200 MG PO TB12: ORAL_TABLET | ORAL | Status: DC

## 2013-01-20 MED ORDER — MOXIFLOXACIN HCL 400 MG PO TABS: 400.0000 mg | ORAL_TABLET | Freq: Every day | ORAL | Status: DC

## 2013-01-20 MED ORDER — LIDOCAINE HCL (PF) 1 % IJ SOLN
INTRAMUSCULAR | Status: AC
  Filled 2013-01-20: qty 5

## 2013-01-20 MED ORDER — IPRATROPIUM BROMIDE 0.02 % IN SOLN
RESPIRATORY_TRACT | Status: AC
  Filled 2013-01-20: qty 2.5

## 2013-01-20 MED ORDER — CEFTRIAXONE SODIUM 1 G IJ SOLR
INTRAMUSCULAR | Status: AC
  Filled 2013-01-20: qty 10

## 2013-01-20 MED ORDER — CEFTRIAXONE SODIUM 1 G IJ SOLR
1.0000 g | Freq: Once | INTRAMUSCULAR | Status: AC
  Administered 2013-01-20: 1 g via INTRAMUSCULAR

## 2013-01-20 NOTE — ED Notes (Signed)
C/o little productive cough, fever vomiting, dizziness which started last Monday.  Patient was seen by PCP and was dx with ear infection.  PCP gave patient prednisone and doxycyline.

## 2013-01-20 NOTE — ED Notes (Signed)
Patient transported to X-ray 
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
## 2013-01-20 NOTE — ED Provider Notes (Signed)
Medical screening examination/treatment/procedure(s) were performed by a resident physician or non-physician practitioner and as the supervising physician I was immediately available for consultation/collaboration.  Khala Tarte, MD    Adyn Serna S Islay Polanco, MD 01/20/13 1913 

## 2013-01-21 ENCOUNTER — Telehealth: Payer: Self-pay | Admitting: Family Medicine

## 2013-01-21 NOTE — Telephone Encounter (Signed)
[facility] Triage Call Report Triage Record Num: [id number] Operator: Baldomero Lamy Patient Name: Ryan [name] Call Date & Time: 01/19/2013 5:33:18PM Patient Phone: [phone] PCP: Eugenio Hoes. Todd Patient Gender: Male PCP Fax : [phone] Patient DOB: [birth year] [facility] Name: [facility] Reason for Call: Caller: Ryan [name]/Patient; PCP: Kelle Darting [facility]); CB#: [phone]; Call regarding Upper resp virus and ear infection; pt feels bad all over still and had to work today; Pt calling regarding no improvement for URI and OM after being treated with Doxycyline and Prednisone since 01/15/13. Afebrile. Still taking meds. Call Provider w/in 8 hrs for: Being treated by a provider for a secondary infection AND no improvement in sx, sx have worsened OR has new sx after following treatment plan for the teim specified by provider per URI protocol. Pt denies any resp distress, and is managing sx. Care advice given with strict call back parameters. Pt to go to [facility] on Sunday, 01/20/13. Protocol(s) Used: Upper Respiratory Infection (URI) Recommended Outcome per Protocol: Call Provider within 8 Hours Reason for Outcome: Being treated by a provider for a secondary infection AND no improvement in symptoms, symptoms have worsened OR has new symptoms after following treatment plan for the time specified by provider. Care Advice: ~

## 2013-01-22 ENCOUNTER — Encounter: Payer: Self-pay | Admitting: Family Medicine

## 2013-01-22 ENCOUNTER — Ambulatory Visit (INDEPENDENT_AMBULATORY_CARE_PROVIDER_SITE_OTHER): Payer: BC Managed Care – PPO | Admitting: Family Medicine

## 2013-01-22 VITALS — BP 130/98 | HR 117 | Temp 97.9°F | Wt 231.0 lb

## 2013-01-22 DIAGNOSIS — J069 Acute upper respiratory infection, unspecified: Secondary | ICD-10-CM

## 2013-01-22 DIAGNOSIS — H6590 Unspecified nonsuppurative otitis media, unspecified ear: Secondary | ICD-10-CM

## 2013-01-22 DIAGNOSIS — H6593 Unspecified nonsuppurative otitis media, bilateral: Secondary | ICD-10-CM

## 2013-01-22 DIAGNOSIS — J45909 Unspecified asthma, uncomplicated: Secondary | ICD-10-CM

## 2013-01-22 LAB — CULTURE, GROUP A STREP

## 2013-01-22 NOTE — Progress Notes (Signed)
  Subjective:    Patient ID: Ryan [name], male    DOB: 1985-12-15, 27 y.o.   MRN: [id number]  HPI Ryan [name] is a 27 year old single male nonsmoker who comes in today for followup  We saw about a week ago with a viral syndrome and treat him symptomatically with fluids and Hydromet. He then went to an urgent care this past Sunday. CBC and chest x-ray were normal. He was given a shot of antibiotics and a prescription for Avelox. Also start on prednisone 60 mg daily. He says he feels much better but he still has fluid in his ears popping in his ears and dizziness.   Review of Systems Review of systems negative except for his girlfriend was recently told him she's pregnant    Objective:   Physical Exam  Well-developed well-nourished male in no acute distress HEENT pertinent for bilateral serous otitis media with both TMs bulging  Neck was supple lungs are clear      Assessment & Plan:  Viral syndrome with secondary asthma resolving  Bilateral serous otitis media not resolving...........Marland Kitchen plan see orders

## 2013-01-22 NOTE — Patient Instructions (Signed)
Avelox 400 mg............. one half tab daily till prescription finished then switched to the doxycycline one twice daily  Drink lots of water  Hydromet 1/2-1 teaspoon 3 times daily. For cough  Afrin nasal spray.........Marland Kitchen 1 shot of each nostril at bedtime for 7 days then stop  Prednisone 20 mg,,,,,,,,,, 2 tabs x3 days, 1 tab x3 days, a half a tab x3 days, then a half a tablet Monday Wednesday Friday for a two-week taper  Return in one week for followup

## 2013-01-23 ENCOUNTER — Telehealth: Payer: Self-pay | Admitting: Family Medicine

## 2013-01-23 NOTE — Telephone Encounter (Signed)
Pt needs al

## 2013-01-23 NOTE — Telephone Encounter (Addendum)
Pt needs refill of  albuterol (PROVENTIL HFA;VENTOLIN HFA) 108 (90 BASE) MCG/ACT inhaler HYDROcodone-homatropine (HYCODAN) 5-1.5 MG/5ML syrup [facility]

## 2013-01-24 ENCOUNTER — Other Ambulatory Visit: Payer: Self-pay | Admitting: Internal Medicine

## 2013-01-24 MED ORDER — DOXYCYCLINE HYCLATE 100 MG PO TABS: 100.0000 mg | ORAL_TABLET | Freq: Two times a day (BID) | ORAL | Status: DC

## 2013-01-24 NOTE — Telephone Encounter (Signed)
rx sent to pharmacy and patient is aware. 

## 2013-01-24 NOTE — Telephone Encounter (Signed)
Pt would also like to have refill of doxycycline / pt states he was instructed on paperwork to begin this med next.

## 2013-01-24 NOTE — Telephone Encounter (Signed)
Ryan [name] was given an 8 ounce bottle of cough syrup,,,,,,,,,,,, he does not need anymore  Okay to refill the albuterol 1 or 2 puffs 3 times daily when necessary dispense 1 refills x2

## 2013-01-29 ENCOUNTER — Encounter: Payer: Self-pay | Admitting: Family Medicine

## 2013-01-29 ENCOUNTER — Ambulatory Visit (INDEPENDENT_AMBULATORY_CARE_PROVIDER_SITE_OTHER): Payer: BC Managed Care – PPO | Admitting: Family Medicine

## 2013-01-29 VITALS — BP 130/90 | Temp 97.7°F | Wt 228.0 lb

## 2013-01-29 DIAGNOSIS — J069 Acute upper respiratory infection, unspecified: Secondary | ICD-10-CM

## 2013-01-29 DIAGNOSIS — J45909 Unspecified asthma, uncomplicated: Secondary | ICD-10-CM

## 2013-01-29 MED ORDER — BENZONATATE 100 MG PO CAPS: 100.0000 mg | ORAL_CAPSULE | Freq: Two times a day (BID) | ORAL | Status: DC | PRN

## 2013-01-29 NOTE — Progress Notes (Signed)
  Subjective:    Patient ID: Ryan [name], male    DOB: [birth year], 27 y.o.   MRN: [id number]  HPI Edward is a 27 year old male who comes in today for reevaluation of asthma  He's taper down to 10 mg of prednisone daily and is still wheezing. He uses the Qvar dose 2 puffs twice a day been in one or 2 puffs daily   Review of Systems Review of systems negative    Objective:   Physical Exam  Well-developed and nourished male no acute distress vital signs stable he is afebrile BP 130/9 D. ENT negative except for still he has some persistent fluid in that left TM 6  Lungs are clear except for some mildly expiratory symmetrical wheezing      Assessment & Plan:  Asthma slowly resolving increase prednisone to 20 mg day

## 2013-01-29 NOTE — Patient Instructions (Signed)
Prednisone 20 mg........... one tablet daily for 7 days, a half a tablet daily for 7 days, then a half a tab Monday Wednesday Friday for a 3 week taper  Continue to use the Qvar,,,,,,,, 2 puffs twice daily  Albuterol,,,,,,,, 2 puffs 3 times daily as needed  Drink lots of water  Avoid smoke exposure

## 2013-01-31 NOTE — Telephone Encounter (Signed)
Pt was seen in office on 08-29-12 Closed Encounter

## 2013-02-28 ENCOUNTER — Ambulatory Visit (INDEPENDENT_AMBULATORY_CARE_PROVIDER_SITE_OTHER): Payer: BC Managed Care – PPO

## 2013-02-28 DIAGNOSIS — Z23 Encounter for immunization: Secondary | ICD-10-CM

## 2013-03-11 ENCOUNTER — Ambulatory Visit (INDEPENDENT_AMBULATORY_CARE_PROVIDER_SITE_OTHER): Payer: BC Managed Care – PPO | Admitting: Physician Assistant

## 2013-03-11 ENCOUNTER — Encounter: Payer: Self-pay | Admitting: Physician Assistant

## 2013-03-11 VITALS — BP 126/80 | HR 86 | Temp 97.7°F | Resp 16 | Wt 231.0 lb

## 2013-03-11 DIAGNOSIS — A084 Viral intestinal infection, unspecified: Secondary | ICD-10-CM | POA: Insufficient documentation

## 2013-03-11 DIAGNOSIS — A088 Other specified intestinal infections: Secondary | ICD-10-CM

## 2013-03-11 DIAGNOSIS — H6690 Otitis media, unspecified, unspecified ear: Secondary | ICD-10-CM | POA: Insufficient documentation

## 2013-03-11 LAB — POCT INFLUENZA A/B
Influenza A, POC: NEGATIVE
Influenza B, POC: NEGATIVE

## 2013-03-11 MED ORDER — CEFDINIR 300 MG PO CAPS: 300.0000 mg | ORAL_CAPSULE | Freq: Two times a day (BID) | ORAL | Status: DC

## 2013-03-11 MED ORDER — ONDANSETRON 4 MG PO TBDP: 4.0000 mg | ORAL_TABLET | Freq: Three times a day (TID) | ORAL | Status: DC | PRN

## 2013-03-11 NOTE — Patient Instructions (Signed)
Increase fluid intake.  Rest.  Daily probiotic.  Saline nasal spray.  Claritin daily.  Ginger and Immodium for diarrhea.  You will be contacted by an ENT office to shcedule an appointment.  Take antibiotic as prescribed, until all tablets are gone.    Otitis Media, Adult A middle ear infection is an infection in the space behind the eardrum. The medical name for this is "otitis media." It may happen after a common cold. It is caused by a germ that starts growing in that space. You may feel swollen glands in your neck on the side of the ear infection. HOME CARE INSTRUCTIONS   Take your medicine as directed until it is gone, even if you feel better after the first few days.  Only take over-the-counter or prescription medicines for pain, discomfort, or fever as directed by your caregiver.  Occasional use of a nasal decongestant a couple times per day may help with discomfort and help the eustachian tube to drain better. Follow up with your caregiver in 10 to 14 days or as directed, to be certain that the infection has cleared. Not keeping the appointment could result in a chronic or permanent injury, pain, hearing loss and disability. If there is any problem keeping the appointment, you must call back to this facility for assistance. SEEK IMMEDIATE MEDICAL CARE IF:   You are not getting better in 2 to 3 days.  You have pain that is not controlled with medication.  You feel worse instead of better.  You cannot use the medication as directed.  You develop swelling, redness or pain around the ear or stiffness in your neck. MAKE SURE YOU:   Understand these instructions.  Will watch your condition.  Will get help right away if you are not doing well or get worse. Document Released: 12/18/2003 Document Revised: 06/06/2011 Document Reviewed: 10/09/2012 [facility] Patient Information 2014 [place], [place].
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
## 2013-03-11 NOTE — Assessment & Plan Note (Signed)
Multiple episodes this year.  Most recent episode last month. Failed Rx of doxycycline.  Will Rx Cefdinir and make ENT referral for evaluation and possible tympanocentesis.

## 2013-03-11 NOTE — Assessment & Plan Note (Signed)
Flu swab obtained giving myalgias and chills in addition to N/V/D.  Negative.  Rest.  Increase fluid intake.  BRAT diet.  Zofran for severe nausea.  Ginger ale or ginger supplement for mild nausea.  Immodium for loose stools.  Please call or return to clinic if symptoms not improving over the next 24-48 hours.

## 2013-03-22 ENCOUNTER — Encounter (HOSPITAL_COMMUNITY): Payer: Self-pay | Admitting: Emergency Medicine

## 2013-03-22 ENCOUNTER — Emergency Department (HOSPITAL_COMMUNITY)
Admission: EM | Admit: 2013-03-22 | Discharge: 2013-03-22 | Disposition: A | Discharge status: Home / Self Care | Payer: BC Managed Care – PPO | Source: Home / Self Care | Attending: Emergency Medicine | Admitting: Emergency Medicine

## 2013-03-22 DIAGNOSIS — H6691 Otitis media, unspecified, right ear: Secondary | ICD-10-CM

## 2013-03-22 DIAGNOSIS — H6091 Unspecified otitis externa, right ear: Secondary | ICD-10-CM

## 2013-03-22 DIAGNOSIS — J069 Acute upper respiratory infection, unspecified: Secondary | ICD-10-CM

## 2013-03-22 DIAGNOSIS — K645 Perianal venous thrombosis: Secondary | ICD-10-CM

## 2013-03-22 MED ORDER — BENZONATATE 200 MG PO CAPS: 200.0000 mg | ORAL_CAPSULE | Freq: Three times a day (TID) | ORAL | Status: DC | PRN

## 2013-03-22 MED ORDER — CEFDINIR 300 MG PO CAPS: 300.0000 mg | ORAL_CAPSULE | Freq: Two times a day (BID) | ORAL | Status: DC

## 2013-03-22 MED ORDER — NEOMYCIN-POLYMYXIN-HC 3.5-10000-1 OT SUSP: 3.0000 [drp] | Freq: Four times a day (QID) | OTIC | Status: DC

## 2013-03-22 NOTE — ED Provider Notes (Signed)
Chief Complaint:   Chief Complaint  Patient presents with  . URI  . Hemorrhoids    History of Present Illness:   Ryan [name] is a 27 year old male who presents today for a hemorrhoid and upper respiratory symptoms.  1. Hemorrhoid: He has a hemorrhoid on the left for the past one and half weeks. It's painful. It's not been bleeding. He denies constipation or diarrhea.  2. Upper respiratory infection: Since this morning at 5 AM he's had cough productive yellow sputum, wheezing, chest pain, dizziness, right ear pain, nasal congestion, and headache. He denies sore throat or fever. He is an asthmatic and uses Qvar and albuterol.  Review of Systems:  Other than noted above, the patient denies any of the following symptoms: Systemic:  No fevers, chills, sweats, weight loss or gain, fatigue, or tiredness. Eye:  No redness or discharge. ENT:  No ear pain, drainage, headache, nasal congestion, drainage, sinus pressure, difficulty swallowing, or sore throat. Neck:  No neck pain or swollen glands. Lungs:  No cough, sputum production, hemoptysis, wheezing, chest tightness, shortness of breath or chest pain. GI:  No abdominal pain, nausea, vomiting or diarrhea.  PMFSH:  Past medical history, family history, social history, meds, and allergies were reviewed. He is allergic to penicillin. He takes Cozaar for high blood pressure. He also has asthma.  Physical Exam:   Vital signs:  BP 158/90  Pulse 83  Temp(Src) 97.7 F (36.5 C) (Oral)  Resp 20  SpO2 100% General:  Alert and oriented.  In no distress.  Skin warm and dry. Eye:  No conjunctival injection or drainage. Lids were normal. ENT:  The right TM was erythematous and dull. The right canal was also erythematous.  Nasal mucosa was clear and uncongested, without drainage.  Mucous membranes were moist.  Pharynx was clear with no exudate or drainage.  There were no oral ulcerations or lesions. Neck:  Supple, no adenopathy, tenderness or  mass. Lungs:  No respiratory distress.  Lungs were clear to auscultation, without wheezes, rales or rhonchi.  Breath sounds were clear and equal bilaterally.  Heart:  Regular rhythm, without gallops, murmers or rubs. Rectal exam: External exam reveals a thrombosed external hemorrhoid on the left which was tender to touch. Skin:  Clear, warm, and dry, without rash or lesions.  Procedure Note:  Verbal informed consent was obtained from the patient.  Risks and benefits were outlined with the patient.  Patient understands and accepts these risks.  Identity of the patient was confirmed verbally and by armband.    Procedure was performed as follows:  The perianal area was prepped with Betadine and the thrombosed hemorrhoid was anesthetized with 3 mL of 2% Xylocaine with epinephrine. A single incision was made into the thrombosed hemorrhoid and all the clot was expressed. A gauze dressing was left in place and he was instructed in wound care.  Patient tolerated the procedure well without any immediate complications.  Assessment:  The primary encounter diagnosis was Hemorrhoids, external, thrombosed. Diagnoses of Viral upper respiratory infection, Otitis media, right, and Otitis externa, right were also pertinent to this visit.  Plan:   1.  Meds:  The following meds were prescribed:   Discharge Medication List as of 03/22/2013  2:17 PM    START taking these medications   Details  !! benzonatate (TESSALON) 200 MG capsule Take 1 capsule (200 mg total) by mouth 3 (three) times daily as needed for cough., Starting 03/22/2013, Until Discontinued, Normal    !!  cefdinir (OMNICEF) 300 MG capsule Take 1 capsule (300 mg total) by mouth 2 (two) times daily., Starting 03/22/2013, Until Discontinued, Normal    neomycin-polymyxin-hydrocortisone (CORTISPORIN) 3.5-10000-1 otic suspension Place 3 drops into the right ear 4 (four) times daily., Starting 03/22/2013, Until Discontinued, Normal     !! - Potential  duplicate medications found. Please discuss with provider.      2.  Patient Education/Counseling:  The patient was given appropriate handouts, self care instructions, and instructed in symptomatic relief.  Suggest he start warm sitz baths with Epsom salts twice daily, stool softeners, and avoid using toilet paper.  3.  Follow up:  The patient was told to follow up if no better in 3 to 4 days, if becoming worse in any way, and given some red flag symptoms such as increasing rectal pain which would prompt immediate return.  Follow up here if needed.      Reuben Likes, MD 03/22/13 [phone]

## 2013-03-22 NOTE — ED Notes (Signed)
C/o of cough with mucous that started 5 am this morning after riding with mom for about 6 hours yesterday, who is sick. Has taken cough syrup and cough drops this morning. Stated that right ear feels clogged and has a hx of bronchitis with ear infections. Pain is a 4/10. Also states that he has hemrroids that have been lasting for 1 week, has used maximum strength preparation H with no relief. Denies any n/v/d, fever. Written by: Marga Melnick

## 2013-03-24 ENCOUNTER — Emergency Department (HOSPITAL_COMMUNITY)
Admission: EM | Admit: 2013-03-24 | Discharge: 2013-03-25 | Disposition: A | Discharge status: Home / Self Care | Payer: BC Managed Care – PPO | Attending: Emergency Medicine | Admitting: Emergency Medicine

## 2013-03-24 DIAGNOSIS — Z87891 Personal history of nicotine dependence: Secondary | ICD-10-CM | POA: Insufficient documentation

## 2013-03-24 DIAGNOSIS — Z79899 Other long term (current) drug therapy: Secondary | ICD-10-CM | POA: Insufficient documentation

## 2013-03-24 DIAGNOSIS — J45909 Unspecified asthma, uncomplicated: Secondary | ICD-10-CM | POA: Insufficient documentation

## 2013-03-24 DIAGNOSIS — Z87442 Personal history of urinary calculi: Secondary | ICD-10-CM | POA: Insufficient documentation

## 2013-03-24 DIAGNOSIS — I10 Essential (primary) hypertension: Secondary | ICD-10-CM | POA: Insufficient documentation

## 2013-03-24 DIAGNOSIS — Z8781 Personal history of (healed) traumatic fracture: Secondary | ICD-10-CM | POA: Insufficient documentation

## 2013-03-24 DIAGNOSIS — K644 Residual hemorrhoidal skin tags: Secondary | ICD-10-CM

## 2013-03-24 DIAGNOSIS — Z880 Allergy status to penicillin: Secondary | ICD-10-CM | POA: Insufficient documentation

## 2013-03-24 DIAGNOSIS — F411 Generalized anxiety disorder: Secondary | ICD-10-CM | POA: Insufficient documentation

## 2013-03-24 DIAGNOSIS — Z872 Personal history of diseases of the skin and subcutaneous tissue: Secondary | ICD-10-CM | POA: Insufficient documentation

## 2013-03-25 ENCOUNTER — Encounter (HOSPITAL_COMMUNITY): Payer: Self-pay | Admitting: Emergency Medicine

## 2013-03-25 MED ORDER — HYDROCODONE-ACETAMINOPHEN 5-325 MG PO TABS: 1.0000 | ORAL_TABLET | ORAL | Status: DC | PRN

## 2013-03-25 MED ORDER — LIDOCAINE 5 % EX OINT: 1.0000 "application " | TOPICAL_OINTMENT | CUTANEOUS | Status: DC | PRN

## 2013-03-25 MED ORDER — HYDROCODONE-ACETAMINOPHEN 5-325 MG PO TABS: 2.0000 | ORAL_TABLET | Freq: Once | ORAL | Status: DC
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
## 2013-03-25 NOTE — ED Notes (Signed)
Pt c/o hemorrhoid pain x2wks, went to [facility] had it lanced on Friday, now having severe pain and swelling

## 2013-03-26 ENCOUNTER — Telehealth: Payer: Self-pay | Admitting: Family Medicine

## 2013-03-26 NOTE — Telephone Encounter (Signed)
Please call and offer 11:30 tomorrow

## 2013-03-26 NOTE — Telephone Encounter (Signed)
appt made

## 2013-03-26 NOTE — Telephone Encounter (Signed)
Pt had hemorrhoid lanced last Friday at an urgent care, it is now infected. Can pt be fit in any time soon? Please advise.

## 2013-03-27 ENCOUNTER — Encounter (HOSPITAL_COMMUNITY): Payer: Self-pay | Admitting: Emergency Medicine

## 2013-03-27 ENCOUNTER — Encounter: Payer: Self-pay | Admitting: Family Medicine

## 2013-03-27 ENCOUNTER — Ambulatory Visit (INDEPENDENT_AMBULATORY_CARE_PROVIDER_SITE_OTHER): Payer: BC Managed Care – PPO | Admitting: Family Medicine

## 2013-03-27 ENCOUNTER — Emergency Department (HOSPITAL_COMMUNITY): Payer: BC Managed Care – PPO

## 2013-03-27 ENCOUNTER — Emergency Department (HOSPITAL_COMMUNITY)
Admission: EM | Admit: 2013-03-27 | Discharge: 2013-03-27 | Disposition: A | Discharge status: Home / Self Care | Payer: BC Managed Care – PPO | Attending: Emergency Medicine | Admitting: Emergency Medicine

## 2013-03-27 VITALS — BP 130/90 | Temp 97.9°F | Wt 222.0 lb

## 2013-03-27 DIAGNOSIS — Z880 Allergy status to penicillin: Secondary | ICD-10-CM | POA: Insufficient documentation

## 2013-03-27 DIAGNOSIS — S2249XA Multiple fractures of ribs, unspecified side, initial encounter for closed fracture: Secondary | ICD-10-CM | POA: Insufficient documentation

## 2013-03-27 DIAGNOSIS — F411 Generalized anxiety disorder: Secondary | ICD-10-CM | POA: Insufficient documentation

## 2013-03-27 DIAGNOSIS — Z872 Personal history of diseases of the skin and subcutaneous tissue: Secondary | ICD-10-CM | POA: Insufficient documentation

## 2013-03-27 DIAGNOSIS — Z79899 Other long term (current) drug therapy: Secondary | ICD-10-CM | POA: Insufficient documentation

## 2013-03-27 DIAGNOSIS — Z87442 Personal history of urinary calculi: Secondary | ICD-10-CM | POA: Insufficient documentation

## 2013-03-27 DIAGNOSIS — J45909 Unspecified asthma, uncomplicated: Secondary | ICD-10-CM | POA: Insufficient documentation

## 2013-03-27 DIAGNOSIS — I10 Essential (primary) hypertension: Secondary | ICD-10-CM | POA: Insufficient documentation

## 2013-03-27 DIAGNOSIS — K645 Perianal venous thrombosis: Secondary | ICD-10-CM

## 2013-03-27 DIAGNOSIS — Z792 Long term (current) use of antibiotics: Secondary | ICD-10-CM | POA: Insufficient documentation

## 2013-03-27 DIAGNOSIS — IMO0002 Reserved for concepts with insufficient information to code with codable children: Secondary | ICD-10-CM | POA: Insufficient documentation

## 2013-03-27 DIAGNOSIS — S2232XB Fracture of one rib, left side, initial encounter for open fracture: Secondary | ICD-10-CM

## 2013-03-27 DIAGNOSIS — Z87891 Personal history of nicotine dependence: Secondary | ICD-10-CM | POA: Insufficient documentation

## 2013-03-27 DIAGNOSIS — S2242XA Multiple fractures of ribs, left side, initial encounter for closed fracture: Secondary | ICD-10-CM

## 2013-03-27 DIAGNOSIS — Z23 Encounter for immunization: Secondary | ICD-10-CM | POA: Insufficient documentation

## 2013-03-27 DIAGNOSIS — T07XXXA Unspecified multiple injuries, initial encounter: Secondary | ICD-10-CM | POA: Insufficient documentation

## 2013-03-27 LAB — URINALYSIS, ROUTINE W REFLEX MICROSCOPIC
Bilirubin Urine: NEGATIVE
Glucose, UA: NEGATIVE mg/dL
Hgb urine dipstick: NEGATIVE
Ketones, ur: NEGATIVE mg/dL
Leukocytes, UA: NEGATIVE
Nitrite: NEGATIVE
Protein, ur: NEGATIVE mg/dL
Specific Gravity, Urine: 1.005 (ref 1.005–1.030)
Urobilinogen, UA: 0.2 mg/dL (ref 0.0–1.0)
pH: 6 (ref 5.0–8.0)

## 2013-03-27 MED ORDER — OXYCODONE-ACETAMINOPHEN 5-325 MG PO TABS: 2.0000 | ORAL_TABLET | ORAL | Status: DC | PRN

## 2013-03-27 MED ORDER — OXYCODONE-ACETAMINOPHEN 5-325 MG PO TABS
2.0000 | ORAL_TABLET | Freq: Once | ORAL | Status: AC
  Administered 2013-03-27: 2 via ORAL
  Filled 2013-03-27: qty 2

## 2013-03-27 MED ORDER — KETOROLAC TROMETHAMINE 60 MG/2ML IM SOLN
60.0000 mg | Freq: Once | INTRAMUSCULAR | Status: AC
  Administered 2013-03-27: 60 mg via INTRAMUSCULAR
  Filled 2013-03-27: qty 2

## 2013-03-27 MED ORDER — IBUPROFEN 800 MG PO TABS: 800.0000 mg | ORAL_TABLET | Freq: Three times a day (TID) | ORAL | Status: DC

## 2013-03-27 MED ORDER — TETANUS-DIPHTH-ACELL PERTUSSIS 5-2.5-18.5 LF-MCG/0.5 IM SUSP
0.5000 mL | Freq: Once | INTRAMUSCULAR | Status: AC
  Administered 2013-03-27: 0.5 mL via INTRAMUSCULAR
  Filled 2013-03-27: qty 0.5

## 2013-03-27 MED ORDER — OXYCODONE-ACETAMINOPHEN 5-325 MG PO TABS: ORAL_TABLET | ORAL | Status: DC
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
## 2013-03-27 NOTE — Patient Instructions (Signed)
For the thrombosed hemorrhoid,,,,,,,,,,, drink lots of water and take a stool softener daily  Percocet 5 mg,,,,,,,,,, one half to one tablet every 4 hours. For pain  Rest at home  Ice pack to the sore ribs  Followup the fourth week in January sooner if any problems

## 2013-03-27 NOTE — ED Notes (Signed)
CSI here to take pictures
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
## 2013-03-27 NOTE — ED Notes (Signed)
He also complains that his head hurts

## 2013-03-29 ENCOUNTER — Telehealth: Payer: Self-pay | Admitting: Family Medicine

## 2013-03-29 NOTE — Telephone Encounter (Signed)
Pt states he thought dr Ryan [name] was going to call in a muscle relaxer for his pain, but he did not get  Pt aware dr todd is out until Monday. [facility]

## 2013-04-01 MED ORDER — CYCLOBENZAPRINE HCL 10 MG PO TABS
ORAL_TABLET | ORAL | Status: DC
Start: 2013-04-01 — End: 2013-12-26

## 2013-04-01 NOTE — Telephone Encounter (Signed)
Rx sent to pharmacy   

## 2013-04-01 NOTE — Telephone Encounter (Signed)
Flexeril 10 mg dispense 60 tabs directions ,,,,,,,,,,,, 1/2-1 twice a day when necessary for muscle spasm refills x1

## 2013-04-05 ENCOUNTER — Ambulatory Visit (INDEPENDENT_AMBULATORY_CARE_PROVIDER_SITE_OTHER): Payer: BC Managed Care – PPO | Admitting: General Surgery

## 2013-04-16 ENCOUNTER — Telehealth: Payer: Self-pay | Admitting: Family Medicine
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
## 2013-04-22 ENCOUNTER — Ambulatory Visit (INDEPENDENT_AMBULATORY_CARE_PROVIDER_SITE_OTHER): Payer: BC Managed Care – PPO | Admitting: Family Medicine

## 2013-04-22 ENCOUNTER — Encounter: Payer: Self-pay | Admitting: Family Medicine

## 2013-04-22 VITALS — BP 110/80 | Temp 97.8°F | Wt 234.0 lb

## 2013-04-22 DIAGNOSIS — I10 Essential (primary) hypertension: Secondary | ICD-10-CM

## 2013-04-22 DIAGNOSIS — S2249XA Multiple fractures of ribs, unspecified side, initial encounter for closed fracture: Secondary | ICD-10-CM
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
## 2013-04-22 NOTE — Patient Instructions (Signed)
Motrin 400 mg twice daily with food  Flexeril 10 mg ,,,,,,,, 1 at bedtime  Return when necessary

## 2013-04-22 NOTE — Progress Notes (Signed)
Pre visit review using our clinic review tool, if applicable. No additional management support is needed unless otherwise documented below in the visit note. 

## 2013-05-01 ENCOUNTER — Telehealth: Payer: Self-pay | Admitting: Family Medicine

## 2013-05-01 NOTE — Telephone Encounter (Signed)
Relevant patient education assigned to patient using Emmi. ° °

## 2013-07-04 ENCOUNTER — Encounter: Payer: Self-pay | Admitting: Family Medicine

## 2013-07-04 ENCOUNTER — Ambulatory Visit (INDEPENDENT_AMBULATORY_CARE_PROVIDER_SITE_OTHER): Payer: BC Managed Care – PPO | Admitting: Family Medicine

## 2013-07-04 VITALS — BP 120/80 | Temp 98.2°F | Wt 234.0 lb

## 2013-07-04 DIAGNOSIS — J45909 Unspecified asthma, uncomplicated: Secondary | ICD-10-CM

## 2013-07-04 DIAGNOSIS — J309 Allergic rhinitis, unspecified: Secondary | ICD-10-CM

## 2013-07-04 MED ORDER — PREDNISONE 20 MG PO TABS: ORAL_TABLET | ORAL | Status: DC

## 2013-07-04 MED ORDER — MONTELUKAST SODIUM 10 MG PO TABS: 10.0000 mg | ORAL_TABLET | Freq: Every day | ORAL | Status: DC

## 2013-07-04 NOTE — Progress Notes (Signed)
   Subjective:    Patient ID: Ryan [name], male    DOB: 1985-09-20, 28 y.o.   MRN: [id number]  HPI Ryan [name] is a 28 year old single male ex-smoker,,,,,,, who comes in accompanied by his S. so who is pregnant,,,,,,,, for evaluation of a asthma for one day  His history of allergic rhinitis and asthma he takes Qvar 40 dose 2 puffs twice a day. Yesterday morning his allergies for some reason increased and he began wheezing again.   Review of Systems    negative Objective:   Physical Exam  Well-developed well-nourished male no acute distress vital signs stable is afebrile HEENT negative neck was supple no adenopathy lungs are clear except for symmetrical mild late expiratory wheezing bilaterally      Assessment & Plan:  Allergic rhinitis,,,,,,, OTC Claritin plain  Asthma,,,,,,,,,,, prednisone burst and taper add Singulair,

## 2013-07-04 NOTE — Patient Instructions (Signed)
Prednisone 20 mg,,,,,,,,,,, 3 now....... then starting tomorrow morning 2 tabs x3 days then taper as outlined  Return on Monday for followup  Continue the Qvar....... 2 puffs twice daily  Singulair 10 mg.........Marland Kitchen 1 at bedtime

## 2013-07-04 NOTE — Progress Notes (Signed)
Pre visit review using our clinic review tool, if applicable. No additional management support is needed unless otherwise documented below in the visit note. 

## 2013-07-09 ENCOUNTER — Ambulatory Visit: Payer: BC Managed Care – PPO | Admitting: Family Medicine

## 2013-07-11 ENCOUNTER — Ambulatory Visit (INDEPENDENT_AMBULATORY_CARE_PROVIDER_SITE_OTHER): Payer: BC Managed Care – PPO | Admitting: Family Medicine

## 2013-07-11 ENCOUNTER — Encounter: Payer: Self-pay | Admitting: Family Medicine

## 2013-07-11 VITALS — BP 120/80 | Temp 97.7°F | Wt 236.0 lb

## 2013-07-11 DIAGNOSIS — J45909 Unspecified asthma, uncomplicated: Secondary | ICD-10-CM

## 2013-07-11 NOTE — Patient Instructions (Signed)
Prednisonr 20 mg,,,,, one tablet daily for 7 days, a half a tablet daily for 7 days, then a half a tablet Monday Wednesday Friday for a 3 week taper  Return when necessary  Continue the Singulair 10 mg,,,,,,, one daily at bedtime

## 2013-07-11 NOTE — Progress Notes (Signed)
   Subjective:    Patient ID: Ryan [name], male    DOB: 1985/12/01, 28 y.o.   MRN: [id number]  HPI Ryan [name] is a 28 year old single male who comes in today for followup of asthma  We saw him a week ago with severe asthma. We started him on Singulair 10 mg daily prednisone 40 mg with a taper and is down to 10 mg a day starting today and albuterol when necessary  He states he feels about 75% better. No side effects from medication   Review of Systems    negative Objective:   Physical Exam Well-developed well-nourished male no acute distress vital signs stable he is afebrile HEENT negative neck was supple no adenopathy lungs are clear except for mild expiratory wheezing       Assessment & Plan:  Asthma resolving slowly plan taper prednisone slowly as outlined

## 2013-08-15 ENCOUNTER — Other Ambulatory Visit: Payer: Self-pay | Admitting: Family Medicine

## 2013-09-23 ENCOUNTER — Ambulatory Visit: Payer: BC Managed Care – PPO | Admitting: *Deleted

## 2013-10-18 ENCOUNTER — Telehealth: Payer: Self-pay | Admitting: Family Medicine

## 2013-10-18 NOTE — Telephone Encounter (Signed)
Pt states his allergy med montelukast (SINGULAIR) 10 MG tablet is not quit doing the job. Pt wants to knw if there's a stronger dose or suggest soemthing else., ot should pt continue to take benadryl w/ [facility] med. [facility]

## 2013-10-21 NOTE — Telephone Encounter (Signed)
Left message on machine for patient to call and schedule an appointment

## 2013-12-26 ENCOUNTER — Ambulatory Visit (INDEPENDENT_AMBULATORY_CARE_PROVIDER_SITE_OTHER): Payer: BC Managed Care – PPO | Admitting: Family Medicine

## 2013-12-26 ENCOUNTER — Encounter: Payer: Self-pay | Admitting: Family Medicine

## 2013-12-26 VITALS — BP 140/110 | Temp 98.2°F | Wt 214.0 lb

## 2013-12-26 DIAGNOSIS — J069 Acute upper respiratory infection, unspecified: Secondary | ICD-10-CM

## 2013-12-26 DIAGNOSIS — Z23 Encounter for immunization: Secondary | ICD-10-CM

## 2013-12-26 DIAGNOSIS — B9789 Other viral agents as the cause of diseases classified elsewhere: Secondary | ICD-10-CM

## 2013-12-26 MED ORDER — HYDROCODONE-HOMATROPINE 5-1.5 MG/5ML PO SYRP: 5.0000 mL | ORAL_SOLUTION | Freq: Three times a day (TID) | ORAL | Status: DC | PRN

## 2013-12-26 NOTE — Patient Instructions (Signed)
Drink lots of liquids  Tylenol,,,,,,,,, 2 tabs 3 times daily when necessary  Hydromet,,,,,,,,,, 1/2-1 teaspoon 3 times daily when necessary for cough and cold  Return when necessary

## 2013-12-26 NOTE — Progress Notes (Signed)
   Subjective:    Patient ID: Ryan [name], male    DOB: November 23, 1985, 28 y.o.   MRN: [id number]  HPI Ryan [name] is a 28 year old male married nonsmoker who has underlying history of asthma and allergic rhinitis who comes in today with a two-day history of head congestion sore throat and cough and 12 hours yesterday vomiting and diarrhea  He had 4 bowel movements yesterday. He is urinating normally. 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 The babies 76 months old. He's been taking his Qvar 2 puffs twice daily and has not needed to use the albuterol. He does not feel like he's been wheezing.   Review of Systems Review of systems otherwise negative    Objective:   Physical Exam  Well-developed well-nourished male in no acute distress vital signs stable he is afebrile HEENT negative neck was supple no adenopathy lungs are clear      Assessment & Plan:  Viral syndrome with cough treat symptomatically,,,,,,,,,,,,,,,

## 2013-12-26 NOTE — Progress Notes (Signed)
Pre visit review using our clinic review tool, if applicable. No additional management support is needed unless otherwise documented below in the visit note. 

## 2014-01-02 ENCOUNTER — Encounter: Payer: Self-pay | Admitting: Family Medicine

## 2014-01-02 ENCOUNTER — Ambulatory Visit (INDEPENDENT_AMBULATORY_CARE_PROVIDER_SITE_OTHER): Payer: BC Managed Care – PPO | Admitting: Family Medicine

## 2014-01-02 VITALS — BP 124/80 | HR 74 | Temp 97.5°F | Wt 214.5 lb

## 2014-01-02 DIAGNOSIS — J4520 Mild intermittent asthma, uncomplicated: Secondary | ICD-10-CM

## 2014-01-02 MED ORDER — PREDNISONE 20 MG PO TABS: ORAL_TABLET | ORAL | Status: DC

## 2014-01-02 NOTE — Progress Notes (Signed)
   Subjective:    Patient ID: Ryan [name], male    DOB: 1985/09/01, 28 y.o.   MRN: [id number]  HPI Ryan [name] is a 28 year old male single nonsmoker who comes in today for evaluation of asthma  10 days ago developed a cold. He has a history of chronic asthma and is on Qvar 42 puffs twice a day and albuterol when necessary  He's been compliant with this Qvar and has not needed to use his albuterol however yesterday began wheezing. He was able sleep last night and did not have to use his albuterol   Review of Systems Pulmonary review of systems otherwise negative no fever no sputum production    Objective:   Physical Exam Well-developed well-nourished male no acute distress vital signs stable is afebrile pulse ox on room air 96% respiratory rate 12 and unlabored  HEENT negative neck was supple no adenopathy lungs are clear except for mild symmetrical late expiratory wheezing on forced expiration       Assessment & Plan:  Viral syndrome with mild asthma...........Marland Kitchen prednisone burst and taper.

## 2014-01-02 NOTE — Patient Instructions (Signed)
Prednisone 20 mg........... 2 tabs x3 days or until you feel significantly better.......... then taper as outlined  Continue the Qvar 2 puffs twice daily  Albuterol 2 puffs 3 times daily when necessary  Return when necessary

## 2014-01-02 NOTE — Progress Notes (Signed)
Pre visit review using our clinic review tool, if applicable. No additional management support is needed unless otherwise documented below in the visit note. 

## 2014-01-10 ENCOUNTER — Encounter: Payer: Self-pay | Admitting: Physician Assistant

## 2014-01-10 ENCOUNTER — Ambulatory Visit (INDEPENDENT_AMBULATORY_CARE_PROVIDER_SITE_OTHER): Payer: BC Managed Care – PPO | Admitting: Physician Assistant

## 2014-01-10 VITALS — BP 124/80 | HR 101 | Temp 97.6°F | Resp 18 | Wt 217.2 lb

## 2014-01-10 DIAGNOSIS — J019 Acute sinusitis, unspecified: Secondary | ICD-10-CM

## 2014-01-10 MED ORDER — DOXYCYCLINE HYCLATE 100 MG PO TABS: 100.0000 mg | ORAL_TABLET | Freq: Two times a day (BID) | ORAL | Status: DC
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
## 2014-01-10 NOTE — Patient Instructions (Addendum)
Doxycycline twice daily for 10 days to treat sinusitis. Take each dose with a full meal to prevent nausea.  Plain Over the Counter Mucinex (NOT Mucinex D) for thick secretions  Force NON dairy fluids, drinking plenty of water is best.    Over the Counter Flonase OR Nasacort AQ 1 spray in each nostril twice a day as needed. Use the "crossover" technique into opposite nostril spraying toward opposite ear @ 45 degree angle, not straight up into nostril.   Continue Singulair.  Saline Irrigation and Saline Sprays can also help reduce symptoms.  If emergency symptoms discussed during visit developed, seek medical attention immediately.  Followup as needed, or for worsening or persistent symptoms despite treatment.   Sinusitis Sinusitis is redness, soreness, and puffiness (inflammation) of the air pockets in the bones of your face (sinuses). The redness, soreness, and puffiness can cause air and mucus to get trapped in your sinuses. This can allow germs to grow and cause an infection.  HOME CARE   Drink enough fluids to keep your pee (urine) clear or pale yellow.  Use a humidifier in your home.  Run a hot shower to create steam in the bathroom. Sit in the bathroom with the door closed. Breathe in the steam 3-4 times a day.  Put a warm, moist washcloth on your face 3-4 times a day, or as told by your doctor.  Use salt water sprays (saline sprays) to wet the thick fluid in your nose. This can help the sinuses drain.  Only take medicine as told by your doctor. GET HELP RIGHT AWAY IF:   Your pain gets worse.  You have very bad headaches.  You are sick to your stomach (nauseous).  You throw up (vomit).  You are very sleepy (drowsy) all the time.  Your face is puffy (swollen).  Your vision changes.  You have a stiff neck.  You have trouble breathing. MAKE SURE YOU:   Understand these instructions.  Will watch your condition.  Will get help right away if you are not doing  well or get worse. Document Released: 08/31/2007 Document Revised: 12/07/2011 Document Reviewed: 10/18/2011 [facility] Patient Information 2015 [place], [place]. This information is not intended to replace advice given to you by your health care provider. Make sure you discuss any questions you have with your health care provider.

## 2014-04-02 ENCOUNTER — Ambulatory Visit (INDEPENDENT_AMBULATORY_CARE_PROVIDER_SITE_OTHER): Payer: BLUE CROSS/BLUE SHIELD | Admitting: Family Medicine

## 2014-04-02 ENCOUNTER — Encounter: Payer: Self-pay | Admitting: Family Medicine

## 2014-04-02 VITALS — BP 120/80 | Temp 97.8°F | Wt 200.0 lb

## 2014-04-02 DIAGNOSIS — L02229 Furuncle of trunk, unspecified: Secondary | ICD-10-CM

## 2014-04-02 DIAGNOSIS — N200 Calculus of kidney: Secondary | ICD-10-CM

## 2014-04-02 DIAGNOSIS — R319 Hematuria, unspecified: Secondary | ICD-10-CM

## 2014-04-02 LAB — URIC ACID: Uric Acid, Serum: 5.6 mg/dL (ref 4.0–7.8)

## 2014-04-02 LAB — BASIC METABOLIC PANEL
BUN: 8 mg/dL (ref 6–23)
CO2: 22 mEq/L (ref 19–32)
Calcium: 9.5 mg/dL (ref 8.4–10.5)
Chloride: 109 mEq/L (ref 96–112)
Creatinine, Ser: 0.8 mg/dL (ref 0.4–1.5)
GFR: 120.13 mL/min (ref >60.00)
Glucose, Bld: 90 mg/dL (ref 70–99)
Potassium: 3.9 mEq/L (ref 3.5–5.1)
Sodium: 137 mEq/L (ref 135–145)

## 2014-04-02 LAB — POCT URINALYSIS DIPSTICK
Bilirubin, UA: NEGATIVE
Blood, UA: NEGATIVE
Glucose, UA: NEGATIVE
Ketones, UA: NEGATIVE
Leukocytes, UA: NEGATIVE
Nitrite, UA: NEGATIVE
Protein, UA: NEGATIVE
Spec Grav, UA: 1.02
Urobilinogen, UA: 0.2
pH, UA: 5.5

## 2014-04-02 LAB — CBC WITH DIFFERENTIAL/PLATELET
Basophils Absolute: 0.1 10*3/uL (ref 0.0–0.1)
Basophils Relative: 0.8 % (ref 0.0–3.0)
Eosinophils Absolute: 0 10*3/uL (ref 0.0–0.7)
Eosinophils Relative: 0 % (ref 0.0–5.0)
HCT: 47.6 % (ref 39.0–52.0)
Hemoglobin: 15.9 g/dL (ref 13.0–17.0)
Lymphocytes Relative: 36.4 % (ref 12.0–46.0)
Lymphs Abs: 2.8 10*3/uL (ref 0.7–4.0)
MCHC: 33.5 g/dL (ref 30.0–36.0)
MCV: 93.1 fl (ref 78.0–100.0)
Monocytes Absolute: 0.6 10*3/uL (ref 0.1–1.0)
Monocytes Relative: 7.4 % (ref 3.0–12.0)
Neutro Abs: 4.3 10*3/uL (ref 1.4–7.7)
Neutrophils Relative %: 55.4 % (ref 43.0–77.0)
Platelets: 276 10*3/uL (ref 150.0–400.0)
RBC: 5.11 Mil/uL (ref 4.22–5.81)
RDW: 12.6 % (ref 11.5–15.5)
WBC: 7.8 10*3/uL (ref 4.0–10.5)

## 2014-04-02 LAB — PHOSPHORUS: Phosphorus: 2.2 mg/dL — ABNORMAL LOW (ref 2.3–4.6)

## 2014-04-02 MED ORDER — HYDROCODONE-ACETAMINOPHEN 7.5-300 MG PO TABS: ORAL_TABLET | ORAL | Status: DC

## 2014-04-02 MED ORDER — SULFAMETHOXAZOLE-TRIMETHOPRIM 800-160 MG PO TABS: ORAL_TABLET | ORAL | Status: DC

## 2014-04-02 MED ORDER — DOXYCYCLINE HYCLATE 100 MG PO TABS: 100.0000 mg | ORAL_TABLET | Freq: Two times a day (BID) | ORAL | Status: DC

## 2014-04-02 NOTE — Progress Notes (Signed)
Pre visit review using our clinic review tool, if applicable. No additional management support is needed unless otherwise documented below in the visit note. 

## 2014-04-02 NOTE — Patient Instructions (Signed)
Doxycycline and Septra.......... one of each twice daily for 1 week........ cover with a Band-Aid and antibiotic ointment and clean twice daily  Vicodin ES.........Marland Kitchen 1/2-1 tablet every 4-6 hours as needed for severe pain  Labs today  Follow-up next Monday  Strain all urines............. save any stones to pass  If the pain gets worse then go to [facility] emergency room and they will call your urologist

## 2014-04-02 NOTE — Progress Notes (Signed)
   Subjective:    Patient ID: Ryan [name], male    DOB: 07/04/1985, 29 y.o.   MRN: [id number]  HPI Ryan [name] is a 29 year old male who comes in today for evaluation 2 problems  For the past 5 days he's had a boil on his right axillary area. It drained spontaneously yesterday. Yesterday evening he noticed some left flank pain that gradually radiated down to his groin. Associated with hematuria. He thinks he passed a stone. Now is having more pain today in the same left flank area.  We send him to urology a couple years ago he was told he had 2 stones on the right and 4 on the left. They were all 4 mm or less in diameter.  He had no analysis no lab work   Review of Systems Review of systems otherwise negative    Objective:   Physical Exam Well-developed well-nourished male no acute distress vital signs stable he is afebrile examination skin shows a marble-sized boil right axillary it is spontaneously drained  Urinalysis today normal       Assessment & Plan:  Boil right axillary area......... treat with doxycycline and Septra  Kidney stone left passed spontaneously......... check metabolic parameters continue to strain urine pain pills as needed

## 2014-04-03 LAB — PTH, INTACT AND CALCIUM
Calcium: 9.6 mg/dL (ref 8.4–10.5)
PTH: 34 pg/mL (ref 14–64)

## 2014-04-07 ENCOUNTER — Ambulatory Visit (INDEPENDENT_AMBULATORY_CARE_PROVIDER_SITE_OTHER): Payer: BLUE CROSS/BLUE SHIELD | Admitting: Family Medicine

## 2014-04-07 DIAGNOSIS — N200 Calculus of kidney: Secondary | ICD-10-CM

## 2014-04-07 DIAGNOSIS — L02229 Furuncle of trunk, unspecified: Secondary | ICD-10-CM

## 2014-04-07 LAB — POCT URINALYSIS DIPSTICK
Bilirubin, UA: NEGATIVE
Glucose, UA: NEGATIVE
Ketones, UA: NEGATIVE
Leukocytes, UA: NEGATIVE
Nitrite, UA: NEGATIVE
Protein, UA: NEGATIVE
Spec Grav, UA: 1.015
Urobilinogen, UA: 0.2
pH, UA: 6

## 2014-04-07 MED ORDER — OXYCODONE-ACETAMINOPHEN 5-325 MG PO TABS: 1.0000 | ORAL_TABLET | Freq: Three times a day (TID) | ORAL | Status: DC | PRN

## 2014-04-07 NOTE — Progress Notes (Signed)
   Subjective:    Patient ID: Ryan [name], male    DOB: [birth year], 29 y.o.   MRN: [id number]  HPI Damyan is a 29 year old male who comes in today for follow-up of 2 problems  We saw him last week with a boil in his right axillary area. It did drain spontaneously at home. We started him on doxycycline and Septra comes back today for follow-up saying it's not as red but he still has a hard lump there.  Over the weekend he passed a kidney stone he didn't save it. He's having problems with a skin rash from the Vicodin.  He supposed to go to [place] on Monday with his father on a business trip. Advised to drink lots water and call the urology center and make an appointment for follow-up there. I will give him a few Percocets in the meantime because of side effects from the Vicodin   Review of Systems    review of systems otherwise negative Objective:   Physical Exam  Well-developed well-nourished male no acute distress vital signs stable he is afebrile the oral in the right axillary area has formed a hard lump.  He was taken to the treatment room and after an alcohol prep was anesthetized with 1% Xylocaine with epinephrine. The lesion was unroofed pus was drained dry sterile dressing was applied  Urinalysis today shows trace blood    Assessment & Plan:  Boil right axillary,,,,,,,,,, I&D under local  History recurrent kidney stones.......... follow-up by urology ASAP

## 2014-04-07 NOTE — Patient Instructions (Signed)
Change the dressing daily on the boil that we excised for 7-10 days until it completely heals  Continue drink lots of water and strain urines. Save any kidney stones  Stop the hydrocodone. I will give you a prescription for Percocet since she had a reaction to the hydrocodone  Call the urology center now and make an appointment to be seen ASAP

## 2014-04-07 NOTE — Addendum Note (Signed)
Addended by: Westley Hummer B on: 04/07/2014 02:49 PM   Modules accepted: Orders

## 2014-04-07 NOTE — Progress Notes (Signed)
Pre visit review using our clinic review tool, if applicable. No additional management support is needed unless otherwise documented below in the visit note. 

## 2014-04-09 ENCOUNTER — Telehealth: Payer: Self-pay | Admitting: Family Medicine

## 2014-04-09 NOTE — Telephone Encounter (Signed)
Patient went to the "eye doctor"  And was given antibiotic drops.
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
## 2014-04-25 ENCOUNTER — Telehealth: Payer: Self-pay | Admitting: Family Medicine

## 2014-04-25 ENCOUNTER — Ambulatory Visit (INDEPENDENT_AMBULATORY_CARE_PROVIDER_SITE_OTHER): Payer: BLUE CROSS/BLUE SHIELD | Admitting: Family Medicine

## 2014-04-25 ENCOUNTER — Encounter: Payer: Self-pay | Admitting: Family Medicine

## 2014-04-25 VITALS — BP 137/75 | HR 77 | Temp 98.5°F | Ht 72.0 in | Wt 202.0 lb

## 2014-04-25 DIAGNOSIS — K529 Noninfective gastroenteritis and colitis, unspecified: Secondary | ICD-10-CM

## 2014-04-25 DIAGNOSIS — R1030 Lower abdominal pain, unspecified: Secondary | ICD-10-CM

## 2014-04-25 LAB — POCT URINALYSIS DIPSTICK
Bilirubin, UA: NEGATIVE
Glucose, UA: NEGATIVE
Ketones, UA: NEGATIVE
Nitrite, UA: NEGATIVE
Spec Grav, UA: >=1.03
Urobilinogen, UA: 0.2
pH, UA: 6

## 2014-04-25 NOTE — Progress Notes (Signed)
Pre visit review using our clinic review tool, if applicable. No additional management support is needed unless otherwise documented below in the visit note. 

## 2014-04-25 NOTE — Telephone Encounter (Signed)
PLEASE NOTE: All timestamps contained within this report are represented as [place] Standard Time. CONFIDENTIALTY NOTICE: This fax transmission is intended only for the addressee. It contains information that is legally privileged, confidential or otherwise protected from use or disclosure. If you are not the intended recipient, you are strictly prohibited from reviewing, disclosing, copying using or disseminating any of this information or taking any action in reliance on or regarding this information. If you have received this fax in error, please notify us immediately by telephone so that we can arrange for its return to [place]. Phone: [phone], Toll-Free: [phone], Fax: [phone] Page: 1 of 1 Call Id: [id number] [place] [facility] - Client [place] Patient Name: Ryan [name] DOB: 06-10-1985 Initial Comment Caller states he has been having sharp stomach pain, nausea, diarrhea Nurse Assessment Nurse: Donalynn Furlong, RN, Myna Hidalgo Date/Time Eilene Ghazi Time): 04/25/2014 11:58:30 AM Confirm and document reason for call. If symptomatic, describe symptoms. ---Caller states he has been having sharp stomach pain, nausea, diarrhea x 2-3 days. Afebrile. Has the patient traveled out of the country within the last 30 days? ---No Does the patient require triage? ---Yes Related visit to physician within the last 2 weeks? ---Yes Does the PT have any chronic conditions? (i.e. diabetes, asthma, etc.) ---Yes List chronic conditions. ---high blood pressure asthma Guidelines Guideline Title Affirmed Question Affirmed Notes Abdominal Pain - Male [1] MILD pain (e.g., does not interfere with normal activities) AND [2] pain comes and goes (cramps) [3] present > 48 hours Final Disposition User See Physician within [place], Therapist, sports, Myna Hidalgo

## 2014-04-25 NOTE — Telephone Encounter (Signed)
Pt scheduled for an appt today

## 2014-04-28 ENCOUNTER — Encounter: Payer: Self-pay | Admitting: Family Medicine

## 2014-04-28 NOTE — Progress Notes (Signed)
   Subjective:    Patient ID: Ryan [name], male    DOB: 1985-05-20, 29 y.o.   MRN: [id number]  HPI Here with his wife for several days of lower abdominal cramps and diarrhea. She has the same sx. No fever or nausea, no vomiting. They think they ate some spoiled chicken salad. Today he feels a little better. He wonders if this could be a kidney stone since he has a hx of this. No urinary sx.   Review of Systems  Constitutional: Negative.   Gastrointestinal: Positive for abdominal pain and diarrhea. Negative for nausea, vomiting, constipation, blood in stool, abdominal distention, anal bleeding and rectal pain.  Genitourinary: Negative.        Objective:   Physical Exam  Constitutional: He appears well-developed and well-nourished. No distress.  Abdominal: Soft. Bowel sounds are normal. He exhibits no distension and no mass. There is no tenderness. There is no rebound and no guarding.          Assessment & Plan:  He seems to be recovering. He will eat a bland diet and take a probiotic like Align daily for a few weeks.

## 2014-05-13 ENCOUNTER — Encounter: Payer: Self-pay | Admitting: Family Medicine

## 2014-05-13 ENCOUNTER — Ambulatory Visit (INDEPENDENT_AMBULATORY_CARE_PROVIDER_SITE_OTHER): Payer: BLUE CROSS/BLUE SHIELD | Admitting: Family Medicine

## 2014-05-13 VITALS — BP 136/84 | HR 79 | Temp 98.1°F | Wt 201.0 lb

## 2014-05-13 DIAGNOSIS — J209 Acute bronchitis, unspecified: Secondary | ICD-10-CM

## 2014-05-13 MED ORDER — HYDROCODONE-HOMATROPINE 5-1.5 MG/5ML PO SYRP: 5.0000 mL | ORAL_SOLUTION | ORAL | Status: DC | PRN

## 2014-05-13 MED ORDER — AZITHROMYCIN 250 MG PO TABS: ORAL_TABLET | ORAL | Status: DC

## 2014-05-13 NOTE — Progress Notes (Signed)
Pre visit review using our clinic review tool, if applicable. No additional management support is needed unless otherwise documented below in the visit note. 

## 2014-05-13 NOTE — Progress Notes (Signed)
   Subjective:    Patient ID: Ryan [name], male    DOB: 07/01/1985, 29 y.o.   MRN: [id number]  HPI Here for 2 days of fever to 100.7 degrees, PND, chest tightness and coughing up green sputum. On Mucinex.    Review of Systems  Constitutional: Positive for fever.  HENT: Positive for congestion and postnasal drip.   Eyes: Negative.   Respiratory: Positive for cough and shortness of breath.        Objective:   Physical Exam  Constitutional: He appears well-developed and well-nourished.  HENT:  Right Ear: External ear normal.  Left Ear: External ear normal.  Nose: Nose normal.  Mouth/Throat: Oropharynx is clear and moist.  Eyes: Conjunctivae are normal.  Pulmonary/Chest: Effort normal. No respiratory distress. He has no wheezes. He has no rales.  Scattered rhonchi   Lymphadenopathy:    He has no cervical adenopathy.          Assessment & Plan:  Recheck prn

## 2014-05-19 ENCOUNTER — Telehealth: Payer: Self-pay | Admitting: Family Medicine

## 2014-05-19 ENCOUNTER — Ambulatory Visit: Payer: BLUE CROSS/BLUE SHIELD | Admitting: Family Medicine

## 2014-05-19 NOTE — Telephone Encounter (Signed)
It is too early to refill the cough med. We can call in a Medrol dose pack however, so please do so

## 2014-05-19 NOTE — Telephone Encounter (Signed)
Pt saw dr fry 2/16 and is not better. Pt had upper resp issues and now now is in his chest w/ wheezing. Pt states prednisone is about the only thing that will help. Pt would like to know if he could get rx for prednisone? Also still has cough and would like refill of cough med [facility] [address]

## 2014-05-20 MED ORDER — METHYLPREDNISOLONE 4 MG PO KIT
PACK | ORAL | Status: DC
Start: 2014-05-20 — End: 2015-03-12

## 2014-05-20 NOTE — Telephone Encounter (Signed)
I sent script e-scribe and spoke with pt. 

## 2014-05-27 ENCOUNTER — Telehealth: Payer: Self-pay | Admitting: Family Medicine

## 2014-05-27 DIAGNOSIS — M549 Dorsalgia, unspecified: Secondary | ICD-10-CM

## 2014-05-27 NOTE — Telephone Encounter (Signed)
Pt called to say that he need a referral see a spine doctor. He said he went to [facility] and he felt like they did not do nothing. Pt said he is having back pain.

## 2014-05-27 NOTE — Telephone Encounter (Signed)
Referral placed.

## 2014-05-28 ENCOUNTER — Encounter (HOSPITAL_COMMUNITY): Payer: Self-pay | Admitting: *Deleted

## 2014-05-28 ENCOUNTER — Emergency Department (HOSPITAL_COMMUNITY)
Admission: EM | Admit: 2014-05-28 | Discharge: 2014-05-29 | Disposition: A | Discharge status: Home / Self Care | Payer: BLUE CROSS/BLUE SHIELD | Attending: Emergency Medicine | Admitting: Emergency Medicine

## 2014-05-28 DIAGNOSIS — Z87891 Personal history of nicotine dependence: Secondary | ICD-10-CM | POA: Insufficient documentation

## 2014-05-28 DIAGNOSIS — M545 Low back pain, unspecified: Secondary | ICD-10-CM

## 2014-05-28 DIAGNOSIS — Z87828 Personal history of other (healed) physical injury and trauma: Secondary | ICD-10-CM | POA: Insufficient documentation

## 2014-05-28 DIAGNOSIS — Z87442 Personal history of urinary calculi: Secondary | ICD-10-CM | POA: Diagnosis not present

## 2014-05-28 DIAGNOSIS — Z880 Allergy status to penicillin: Secondary | ICD-10-CM | POA: Diagnosis not present

## 2014-05-28 DIAGNOSIS — I10 Essential (primary) hypertension: Secondary | ICD-10-CM | POA: Insufficient documentation

## 2014-05-28 DIAGNOSIS — Z79899 Other long term (current) drug therapy: Secondary | ICD-10-CM | POA: Diagnosis not present

## 2014-05-28 DIAGNOSIS — J45909 Unspecified asthma, uncomplicated: Secondary | ICD-10-CM | POA: Insufficient documentation

## 2014-05-28 DIAGNOSIS — Z8781 Personal history of (healed) traumatic fracture: Secondary | ICD-10-CM | POA: Diagnosis not present

## 2014-05-28 DIAGNOSIS — G8929 Other chronic pain: Secondary | ICD-10-CM | POA: Diagnosis not present

## 2014-05-28 DIAGNOSIS — Z8659 Personal history of other mental and behavioral disorders: Secondary | ICD-10-CM | POA: Insufficient documentation
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
## 2014-05-29 ENCOUNTER — Telehealth: Payer: Self-pay | Admitting: Family Medicine

## 2014-05-29 MED ORDER — MORPHINE SULFATE 4 MG/ML IJ SOLN
4.0000 mg | Freq: Once | INTRAMUSCULAR | Status: AC
  Administered 2014-05-29: 4 mg via INTRAMUSCULAR
  Filled 2014-05-29: qty 1

## 2014-05-29 MED ORDER — DIAZEPAM 5 MG PO TABS: 5.0000 mg | ORAL_TABLET | Freq: Two times a day (BID) | ORAL | Status: DC

## 2014-05-29 MED ORDER — HYDROCODONE-ACETAMINOPHEN 5-325 MG PO TABS: ORAL_TABLET | ORAL | Status: DC

## 2014-05-29 NOTE — Telephone Encounter (Signed)
Pt's states his back has gone out again. Pt went to ed and was given pain med, 10 valium and 7 Vicodin. Pt states he has appt w/murphey wainer on mon, but the pain si so bad this pain med will not last him until Monday.  Would like to know what you think he should do? Come in for appt or can can you rx pain med to get him through to Monday?

## 2014-05-29 NOTE — ED Notes (Signed)
Pt A&OX4, ambulatory at d/c with steady gait, NAD 

## 2014-05-29 NOTE — Telephone Encounter (Signed)
Spoke with patient and explained that Dr Sherren Mocha can not give any pain medication at this time.
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
## 2014-05-29 NOTE — Discharge Instructions (Signed)
Take valium and/or vicodin for breakthrough pain, do not drink alcohol, drive, care for children or perfom other critical tasks while taking valium and/or vicodin.  Please follow with your primary care doctor in the next 2 days for a check-up. They must obtain records for further management.   Do not hesitate to return to the Emergency Department for any new, worsening or concerning symptoms.     Back Pain, Adult Low back pain is very common. About 1 in 5 people have back pain.The cause of low back pain is rarely dangerous. The pain often gets better over time.About half of people with a sudden onset of back pain feel better in just 2 weeks. About 8 in 10 people feel better by 6 weeks.  CAUSES Some common causes of back pain include:  Strain of the muscles or ligaments supporting the spine.  Wear and tear (degeneration) of the spinal discs.  Arthritis.  Direct injury to the back. DIAGNOSIS Most of the time, the direct cause of low back pain is not known.However, back pain can be treated effectively even when the exact cause of the pain is unknown.Answering your caregiver's questions about your overall health and symptoms is one of the most accurate ways to make sure the cause of your pain is not dangerous. If your caregiver needs more information, he or she may order lab work or imaging tests (X-rays or MRIs).However, even if imaging tests show changes in your back, this usually does not require surgery. HOME CARE INSTRUCTIONS For many people, back pain returns.Since low back pain is rarely dangerous, it is often a condition that people can learn to [facility] their own.   Remain active. It is stressful on the back to sit or stand in one place. Do not sit, drive, or stand in one place for more than 30 minutes at a time. Take short walks on level surfaces as soon as pain allows.Try to increase the length of time you walk each day.  Do not stay in bed.Resting more than 1 or 2 days  can delay your recovery.  Do not avoid exercise or work.Your body is made to move.It is not dangerous to be active, even though your back may hurt.Your back will likely heal faster if you return to being active before your pain is gone.  Pay attention to your body when you bend and lift. Many people have less discomfortwhen lifting if they bend their knees, keep the load close to their bodies,and avoid twisting. Often, the most comfortable positions are those that put less stress on your recovering back.  Find a comfortable position to sleep. Use a firm mattress and lie on your side with your knees slightly bent. If you lie on your back, put a pillow under your knees.  Only take over-the-counter or prescription medicines as directed by your caregiver. Over-the-counter medicines to reduce pain and inflammation are often the most helpful.Your caregiver may prescribe muscle relaxant drugs.These medicines help dull your pain so you can more quickly return to your normal activities and healthy exercise.  Put ice on the injured area.  Put ice in a plastic bag.  Place a towel between your skin and the bag.  Leave the ice on for 15-20 minutes, 03-04 times a day for the first 2 to 3 days. After that, ice and heat may be alternated to reduce pain and spasms.  Ask your caregiver about trying back exercises and gentle massage. This may be of some benefit.  Avoid feeling  anxious or stressed.Stress increases muscle tension and can worsen back pain.It is important to recognize when you are anxious or stressed and learn ways to manage it.Exercise is a great option. SEEK MEDICAL CARE IF:  You have pain that is not relieved with rest or medicine.  You have pain that does not improve in 1 week.  You have new symptoms.  You are generally not feeling well. SEEK IMMEDIATE MEDICAL CARE IF:   You have pain that radiates from your back into your legs.  You develop new bowel or bladder control  problems.  You have unusual weakness or numbness in your arms or legs.  You develop nausea or vomiting.  You develop abdominal pain.  You feel faint. Document Released: 03/14/2005 Document Revised: 09/13/2011 Document Reviewed: 07/16/2013 [facility] Patient Information 2015 [place], [place]. This information is not intended to replace advice given to you by your health care provider. Make sure you discuss any questions you have with your health care provider.

## 2014-06-19 ENCOUNTER — Telehealth: Payer: Self-pay | Admitting: Family Medicine

## 2014-06-19 DIAGNOSIS — M549 Dorsalgia, unspecified: Secondary | ICD-10-CM

## 2014-06-19 NOTE — Telephone Encounter (Signed)
Pt states the dr at [facility]  will not do any more procedure and any treatment until he speaks w/ pt's doctor.  Pt has one set of injections, but will not proceed unt9l he speaks w/ dr todd.  Pt needsthis asap due to his pain, Advised dr todd out. But cannot wait that long. Pls advise Dr. Ron Agee

## 2014-06-23 NOTE — Addendum Note (Signed)
Addended by: Westley Hummer B on: 06/23/2014 01:19 PM   Modules accepted: Orders

## 2014-06-23 NOTE — Telephone Encounter (Signed)
Patient called back stating he would like to proceed with the referral to [facility].

## 2014-06-23 NOTE — Telephone Encounter (Signed)
Spoke with patient and per Dr Sherren Mocha patient should get a second opinion from a neurosurgeon. Patient will call back as needed.

## 2014-08-01 ENCOUNTER — Other Ambulatory Visit: Payer: Self-pay | Admitting: Family Medicine

## 2014-10-31 IMAGING — CR DG LUMBAR SPINE COMPLETE 4+V
5 series · 5 of 5 positions shown · non-contrast
Comparison: Lumbar spine radiographs performed 08/03/2010, and MRI
of the lumbar spine performed 08/07/2010

CLINICAL DATA: Status post fall; lower back pain.

LUMBAR SPINE - COMPLETE 4+ VIEW

[t l-spine a.p.]
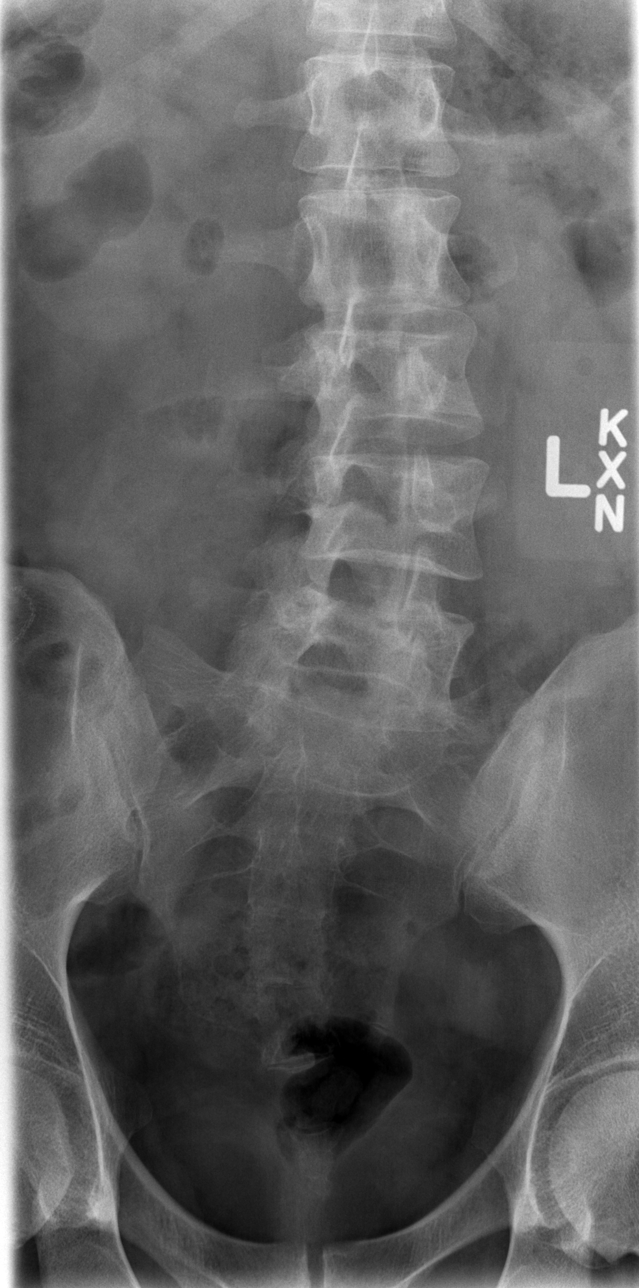

[t l-spine oblique exposure (1 of 2)]
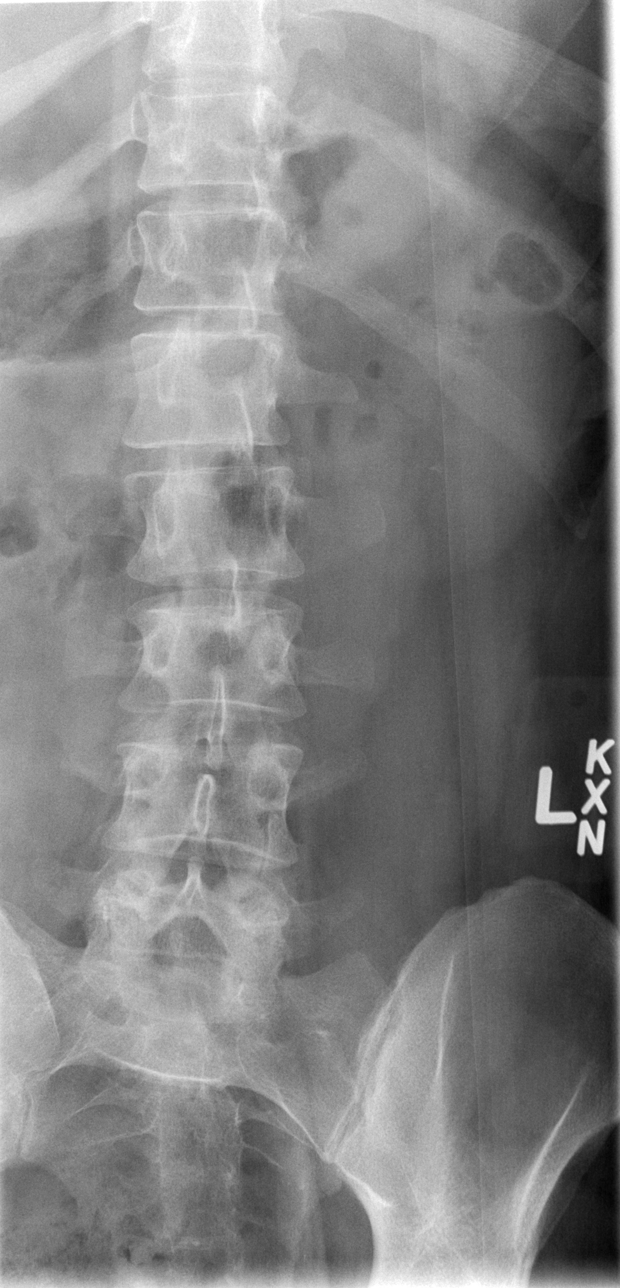

[t l-spine oblique exposure (2 of 2)]
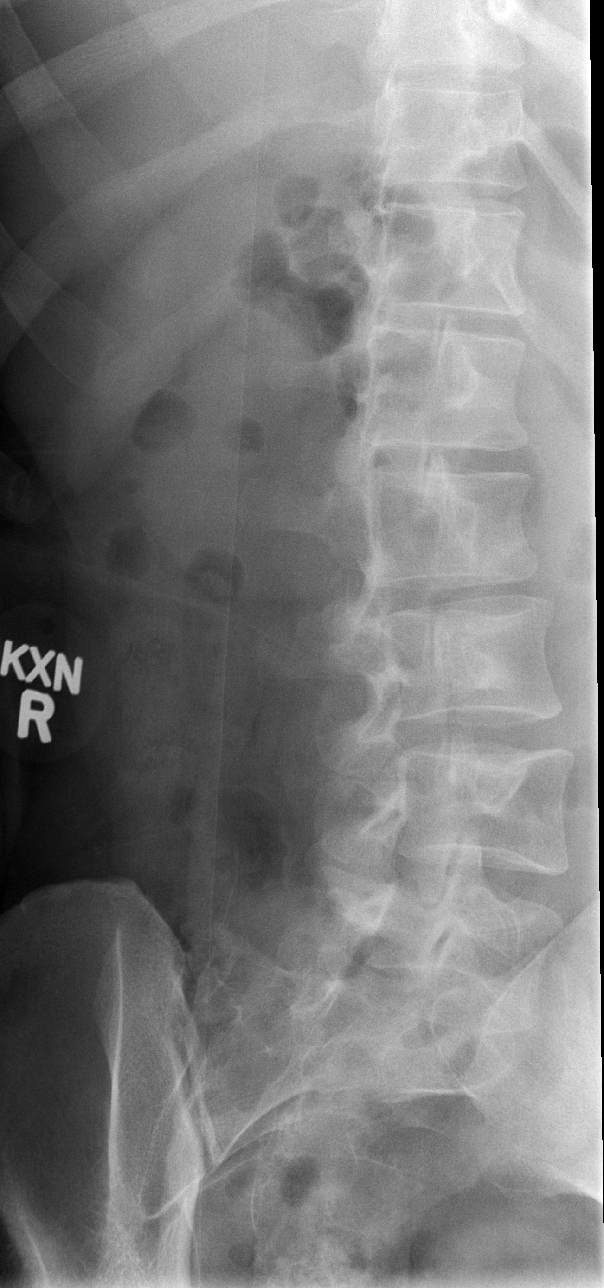

[t l-spine lat]
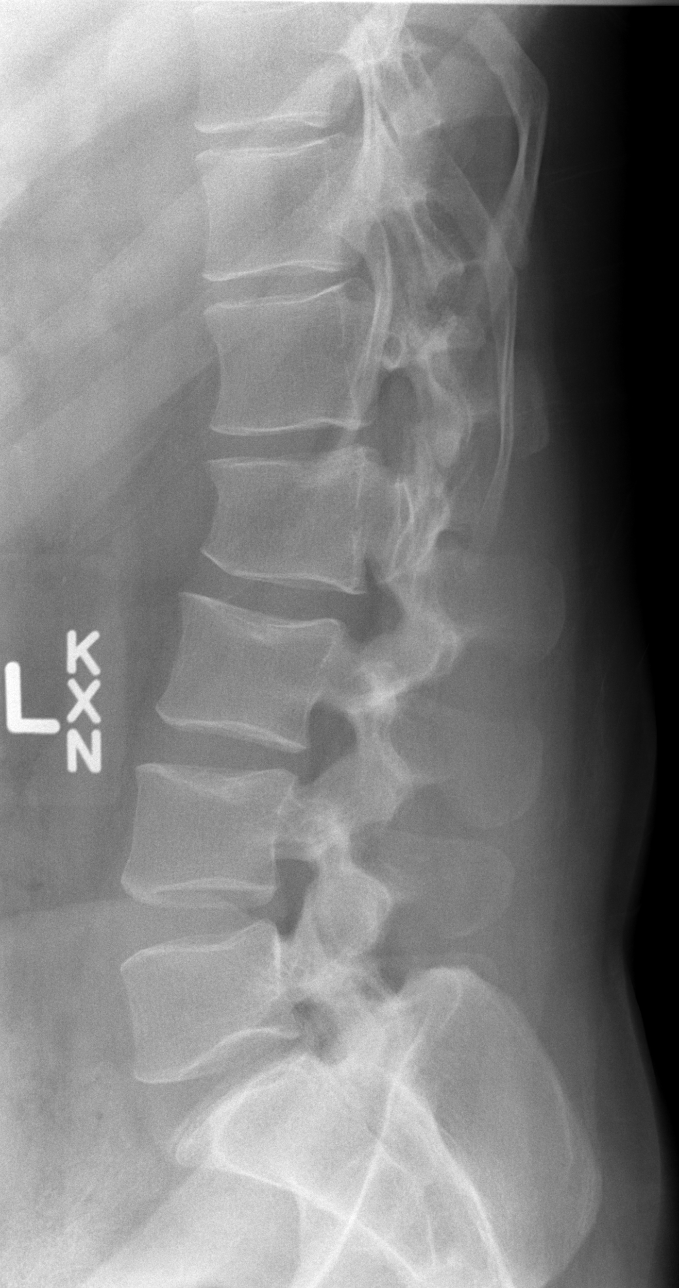

[t l-spine l5-s1 spot]
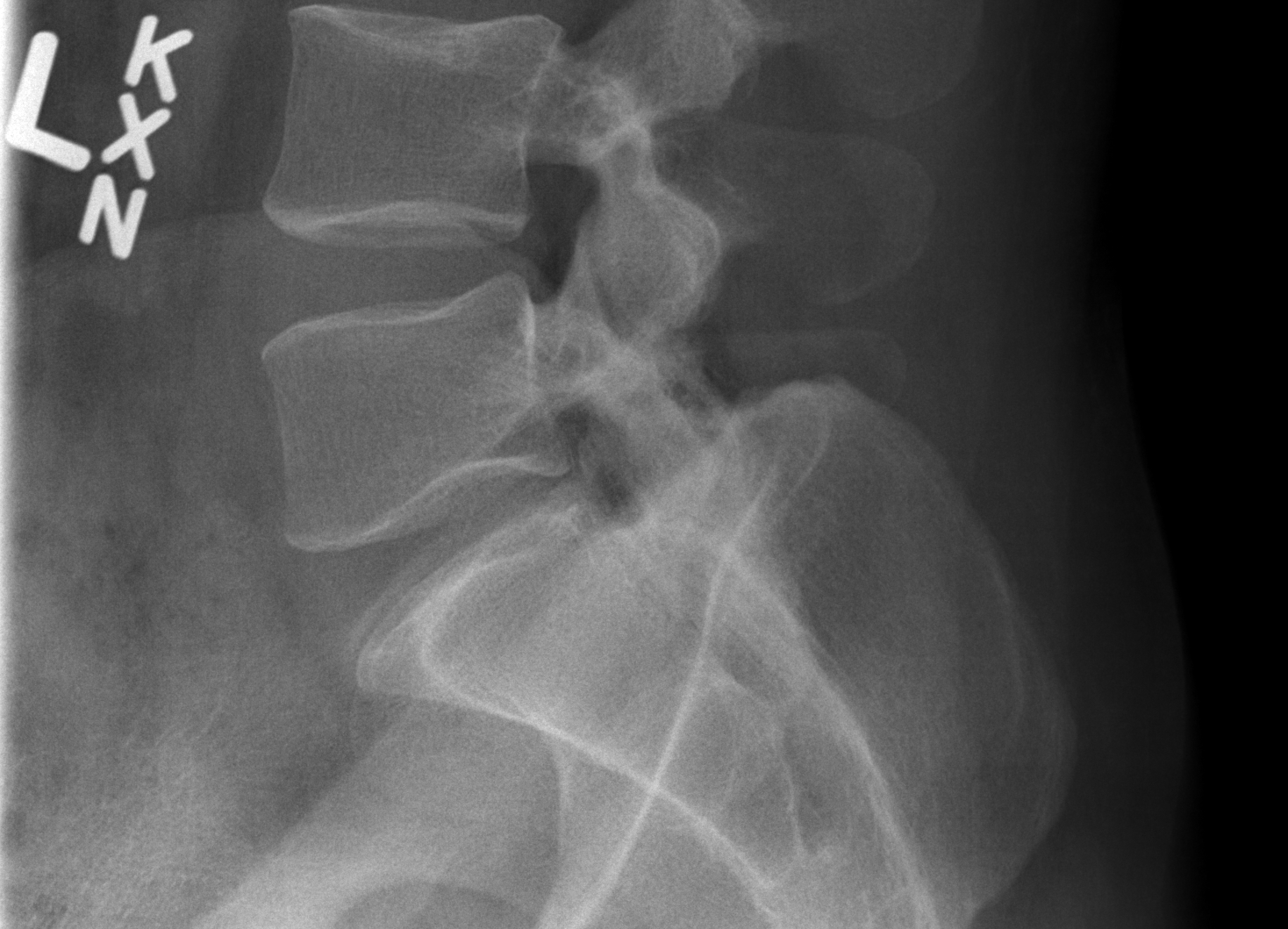

[5 of 5 positions shown; findings below may reference images not displayed]

FINDINGS: There is no evidence of fracture or subluxation.  Minimal
anterior wedging of vertebral body L2 is stable in appearance from
prior studies.  Vertebral bodies demonstrate normal height and
alignment.  Intervertebral disc spaces are preserved.  The
visualized neural foramina are grossly unremarkable in appearance.

The visualized bowel gas pattern is unremarkable in appearance; air
and stool are noted within the colon.  The sacroiliac joints are
within normal limits.
IMPRESSION: No evidence of acute fracture or subluxation along the lumbar
spine.

## 2014-12-01 ENCOUNTER — Telehealth: Payer: Self-pay | Admitting: Pediatrics

## 2014-12-01 NOTE — Telephone Encounter (Signed)
error 

## 2015-01-01 ENCOUNTER — Ambulatory Visit (INDEPENDENT_AMBULATORY_CARE_PROVIDER_SITE_OTHER): Payer: BLUE CROSS/BLUE SHIELD

## 2015-01-01 DIAGNOSIS — Z23 Encounter for immunization: Secondary | ICD-10-CM | POA: Diagnosis not present

## 2015-03-11 ENCOUNTER — Other Ambulatory Visit: Payer: Self-pay | Admitting: Family Medicine

## 2015-03-12 ENCOUNTER — Telehealth: Payer: Self-pay | Admitting: Family Medicine

## 2015-03-12 ENCOUNTER — Ambulatory Visit (INDEPENDENT_AMBULATORY_CARE_PROVIDER_SITE_OTHER): Payer: BLUE CROSS/BLUE SHIELD | Admitting: Family Medicine

## 2015-03-12 ENCOUNTER — Other Ambulatory Visit: Payer: Self-pay | Admitting: Family Medicine

## 2015-03-12 ENCOUNTER — Ambulatory Visit (HOSPITAL_BASED_OUTPATIENT_CLINIC_OR_DEPARTMENT_OTHER)
Admission: RE | Admit: 2015-03-12 | Discharge: 2015-03-12 | Disposition: A | Discharge status: Home / Self Care | Payer: BLUE CROSS/BLUE SHIELD | Source: Ambulatory Visit | Attending: Family Medicine | Admitting: Family Medicine

## 2015-03-12 ENCOUNTER — Encounter: Payer: Self-pay | Admitting: Family Medicine

## 2015-03-12 VITALS — BP 131/83 | HR 64 | Temp 97.8°F | Resp 20 | Wt 210.5 lb

## 2015-03-12 DIAGNOSIS — M546 Pain in thoracic spine: Secondary | ICD-10-CM | POA: Insufficient documentation

## 2015-03-12 DIAGNOSIS — W19XXXA Unspecified fall, initial encounter: Secondary | ICD-10-CM

## 2015-03-12 DIAGNOSIS — R0602 Shortness of breath: Secondary | ICD-10-CM | POA: Diagnosis not present

## 2015-03-12 DIAGNOSIS — R0989 Other specified symptoms and signs involving the circulatory and respiratory systems: Secondary | ICD-10-CM | POA: Diagnosis not present

## 2015-03-12 DIAGNOSIS — R0781 Pleurodynia: Secondary | ICD-10-CM | POA: Diagnosis not present

## 2015-03-12 DIAGNOSIS — W182XXA Fall in (into) shower or empty bathtub, initial encounter: Secondary | ICD-10-CM | POA: Diagnosis not present

## 2015-03-12 MED ORDER — HYDROCODONE-ACETAMINOPHEN 10-325 MG PO TABS: 1.0000 | ORAL_TABLET | Freq: Three times a day (TID) | ORAL | Status: DC | PRN

## 2015-03-12 MED ORDER — CYCLOBENZAPRINE HCL 10 MG PO TABS: 10.0000 mg | ORAL_TABLET | Freq: Three times a day (TID) | ORAL | Status: DC | PRN

## 2015-03-12 MED ORDER — NAPROXEN 500 MG PO TABS: 500.0000 mg | ORAL_TABLET | Freq: Two times a day (BID) | ORAL | Status: DC
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
## 2015-03-12 NOTE — Telephone Encounter (Signed)
Patient Name: Ryan [name] A DOB: 08-May-1985 Initial Comment Caller states, since yesterday , has a certain spot on his back hurts when he breaths, tightness, and sometimes on his back as well. also has a cough Nurse Assessment Nurse: Ronnald Ramp, RN, Miranda Date/Time (Eastern Time): 03/12/2015 10:41:38 AM Confirm and document reason for call. If symptomatic, describe symptoms. ---Caller states he is having pain in left side of his chest for 4-5 months and in his right upper back for a few days, mainly when he breaths. He also has a cough that started yesterday. He fell in the shower 6-7 days and landed on his right side. Has the patient traveled out of the country within the last 30 days? ---Not Applicable Does the patient have any new or worsening symptoms? ---Yes Will a triage be completed? ---Yes Related visit to physician within the last 2 weeks? ---No Does the PT have any chronic conditions? (i.e. diabetes, asthma, etc.) ---Yes List chronic conditions. ---Chronic back pain, Asthma Is this a behavioral health or substance abuse call? ---No Guidelines Guideline Title Affirmed Question Affirmed Notes Chest Pain Taking a deep breath makes pain worse Final Disposition User Go to ED Now (or PCP triage) Ronnald Ramp, RN, Miranda Comments No appts available at PCP or at another [facility] office within the next 1-2 hrs. Will go to [facility]. Referrals [facility] Disagree/Comply: Comply

## 2015-03-12 NOTE — Patient Instructions (Signed)
Go to [facility] on [address] to have STAT xrays of back/ribs/chest . If need immediate treatment will need to check into ED there  Pneumothorax A pneumothorax, commonly called a collapsed lung, is a condition in which air leaks from a lung and builds up in the space between the lung and the chest wall (pleural space). The air in a pneumothorax is trapped outside the lung and takes up space, preventing the lung from fully expanding. This is a condition that usually occurs suddenly. The buildup of air may be small or large. A small pneumothorax may go away on its own. When a pneumothorax is larger, it will often require medical treatment and hospitalization.  CAUSES  A pneumothorax can sometimes happen quickly with no apparent cause. People with underlying lung problems, particularly COPD or emphysema, are at higher risk of pneumothorax. However, pneumothorax can happen quickly even in people with no prior known lung problems. Trauma, surgery, medical procedures, or injury to the chest wall can also cause a pneumothorax. SIGNS AND SYMPTOMS  Sometimes a pneumothorax will have no symptoms. When symptoms are present, they can include:  Chest pain.  Shortness of breath.  Increased rate of breathing.  Bluish color to your lips or skin (cyanosis). DIAGNOSIS  Pneumothorax is usually diagnosed by a chest X-ray or chest CT scan. Your health care provider will also take a medical history and perform a physical exam to determine why you may have a pneumothorax. TREATMENT  A small pneumothorax may go away on its own without treatment. Extra oxygen can sometimes help a small pneumothorax go away more quickly. For a larger pneumothorax or a pneumothorax that is causing symptoms, a procedure is usually needed to drain the air.In some cases, the health care provider may drain the air using a needle. In other cases, a chest tube may be inserted into the pleural space. A chest tube is a small tube  placed between the ribs and into the pleural space. This removes the extra air and allows the lung to expand back to its normal size. A large pneumothorax will usually require a hospital stay. If there is ongoing air leakage into the pleural space, then the chest tube may need to remain in place for several days until the air leak has healed. In some cases, surgery may be needed.  HOME CARE INSTRUCTIONS   Only take over-the-counter or prescription medicines as directed by your health care provider.  If a cough or pain makes it difficult for you to sleep at night, try sleeping in a semi-upright position in a recliner or by using 2 or 3 pillows.  Rest and limit activity as directed by your health care provider.  If you had a chest tube and it was removed, ask your health care provider when it is okay to remove the dressing. Until your health care provider says you can remove the dressing, do not allow it to get wet.  Do not smoke. Smoking is a risk factor for pneumothorax.  Do not fly in an airplane or scuba dive until your health care provider says it is okay.  Follow up with your health care provider as directed. SEEK IMMEDIATE MEDICAL CARE IF:   You have increasing chest pain or shortness of breath.  You have a cough that is not controlled with suppressants.  You begin coughing up blood.  You have pain that is getting worse or is not controlled with medicines.  You cough up thick, discolored mucus (  sputum) that is yellow to green in color.  You have redness, increasing pain, or discharge at the site where a chest tube had been in place (if your pneumothorax was treated with a chest tube).  The site where your chest tube was located opens up.  You feel air coming out of the site where the chest tube was placed.  You have a fever or persistent symptoms for more than 2-3 days.  You have a fever and your symptoms suddenly get worse. MAKE SURE YOU:   Understand these  instructions.  Will watch your condition.  Will get help right away if you are not doing well or get worse.   This information is not intended to replace advice given to you by your health care provider. Make sure you discuss any questions you have with your health care provider.   Document Released: 03/14/2005 Document Revised: 01/02/2013 Document Reviewed: 10/11/2012 Elsevier Interactive Patient Education [employer].

## 2015-03-12 NOTE — Telephone Encounter (Signed)
Spoke with patient reviewed xray results and instructions . Patient prefers to follow up with his PCP. Patient verbalized understanding of all instructions.

## 2015-03-12 NOTE — Telephone Encounter (Signed)
Please call patient: All of his imaging studies today were negative. He does not have any signs of pneumonia, fractures (of either ribs or spine), or collapsed lung. Reassure him that all of his vitals (blood pressure, heart rate, oxygen saturation) Was normal in the office today. These are all reassuring signs. He is still at risk for developing pneumonia if he does not take deep breaths.  - He should be encouraged to make certain he takes full lung filling deep breaths at least a few times an hour (if watching TV, take deep breaths during commercials etc).  - Use the Vicodin, Flexeril prescribed today for comfort.  - Can use a heating pad to help with muscle ache. - In addition he should take naproxen 2 times a day for the next 5 days, I have also called this in for him. This will help with inflammation as well as pain. - I would like to see him on Monday to ensure he is improving. - Any worsening symptoms including more shortness of breath, increasing pain, fever, palpitations, nausea or dizziness he should be seen immediately in the emergency room.

## 2015-03-12 NOTE — Telephone Encounter (Signed)
noted 

## 2015-03-19 ENCOUNTER — Ambulatory Visit (INDEPENDENT_AMBULATORY_CARE_PROVIDER_SITE_OTHER): Payer: BLUE CROSS/BLUE SHIELD

## 2015-03-19 ENCOUNTER — Ambulatory Visit (INDEPENDENT_AMBULATORY_CARE_PROVIDER_SITE_OTHER): Payer: BLUE CROSS/BLUE SHIELD | Admitting: Family Medicine

## 2015-03-19 VITALS — BP 158/84 | HR 76 | Temp 98.4°F | Resp 16 | Ht 71.5 in | Wt 211.0 lb

## 2015-03-19 DIAGNOSIS — M5489 Other dorsalgia: Secondary | ICD-10-CM

## 2015-03-19 DIAGNOSIS — M25511 Pain in right shoulder: Secondary | ICD-10-CM | POA: Diagnosis not present

## 2015-03-19 DIAGNOSIS — M549 Dorsalgia, unspecified: Secondary | ICD-10-CM

## 2015-03-19 DIAGNOSIS — R079 Chest pain, unspecified: Secondary | ICD-10-CM

## 2015-03-19 MED ORDER — HYDROCODONE-ACETAMINOPHEN 5-325 MG PO TABS: 1.0000 | ORAL_TABLET | Freq: Four times a day (QID) | ORAL | Status: DC | PRN

## 2015-03-19 MED ORDER — CYCLOBENZAPRINE HCL 5 MG PO TABS: 5.0000 mg | ORAL_TABLET | Freq: Every day | ORAL | Status: DC
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
## 2015-03-19 NOTE — Patient Instructions (Signed)

## 2015-03-20 ENCOUNTER — Other Ambulatory Visit: Payer: Self-pay | Admitting: *Deleted

## 2015-03-20 MED ORDER — FLUTICASONE PROPIONATE (INHAL) 50 MCG/BLIST IN AEPB: 1.0000 | INHALATION_SPRAY | Freq: Two times a day (BID) | RESPIRATORY_TRACT | Status: DC

## 2015-03-25 ENCOUNTER — Ambulatory Visit (INDEPENDENT_AMBULATORY_CARE_PROVIDER_SITE_OTHER): Payer: BLUE CROSS/BLUE SHIELD | Admitting: Emergency Medicine

## 2015-03-25 ENCOUNTER — Telehealth: Payer: Self-pay | Admitting: Family Medicine

## 2015-03-25 ENCOUNTER — Ambulatory Visit (INDEPENDENT_AMBULATORY_CARE_PROVIDER_SITE_OTHER): Payer: BLUE CROSS/BLUE SHIELD

## 2015-03-25 VITALS — BP 142/88 | HR 75 | Temp 97.7°F | Resp 18 | Ht 69.75 in | Wt 211.8 lb

## 2015-03-25 DIAGNOSIS — M5442 Lumbago with sciatica, left side: Secondary | ICD-10-CM | POA: Diagnosis not present

## 2015-03-25 MED ORDER — OXYCODONE-ACETAMINOPHEN 5-325 MG PO TABS: 1.0000 | ORAL_TABLET | ORAL | Status: DC | PRN

## 2015-03-25 NOTE — Telephone Encounter (Signed)
Pt saw Dr. Ouida Sills at [facility].
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
## 2015-03-25 NOTE — Patient Instructions (Signed)

## 2015-03-27 ENCOUNTER — Ambulatory Visit: Payer: Self-pay | Admitting: Family Medicine

## 2015-04-02 ENCOUNTER — Telehealth: Payer: Self-pay

## 2015-04-02 NOTE — Telephone Encounter (Signed)
Patient is calling because he was recently seen and given pain medication. Patient does not have an appointment with his primary until Tuesday 1/10 and wants to know if we can send him medication to last until then.

## 2015-04-02 NOTE — Telephone Encounter (Signed)
Dr. Ouida Sills does not refill narcotics.

## 2015-04-06 ENCOUNTER — Ambulatory Visit (INDEPENDENT_AMBULATORY_CARE_PROVIDER_SITE_OTHER): Payer: BLUE CROSS/BLUE SHIELD | Admitting: Family Medicine

## 2015-04-06 VITALS — BP 140/90 | Temp 97.9°F | Wt 211.0 lb

## 2015-04-06 DIAGNOSIS — M79605 Pain in left leg: Secondary | ICD-10-CM

## 2015-04-06 DIAGNOSIS — J4520 Mild intermittent asthma, uncomplicated: Secondary | ICD-10-CM

## 2015-04-06 DIAGNOSIS — G8929 Other chronic pain: Secondary | ICD-10-CM | POA: Insufficient documentation

## 2015-04-06 DIAGNOSIS — M545 Low back pain, unspecified: Secondary | ICD-10-CM | POA: Insufficient documentation

## 2015-04-06 MED ORDER — PREDNISONE 20 MG PO TABS: ORAL_TABLET | ORAL | Status: DC

## 2015-04-06 MED ORDER — HYDROCODONE-HOMATROPINE 5-1.5 MG/5ML PO SYRP: ORAL_SOLUTION | ORAL | Status: DC

## 2015-04-06 NOTE — Progress Notes (Signed)
Pre visit review using our clinic review tool, if applicable. No additional management support is needed unless otherwise documented below in the visit note. 
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
## 2015-04-06 NOTE — Patient Instructions (Signed)
Prednisone 20 mg........... 2 tabs now........... then starting tomorrow morning 2 tabs every morning for 3 days............ taper as outlined  Drink lots of water  Hydromet...........Marland Kitchen 1/2-1 teaspoon at bedtime for nighttime cough  We will refer you to the neurosurgical group for further evaluation of your back pain

## 2015-04-07 ENCOUNTER — Ambulatory Visit: Payer: BLUE CROSS/BLUE SHIELD | Admitting: Family Medicine

## 2015-04-08 ENCOUNTER — Telehealth: Payer: Self-pay | Admitting: Family Medicine

## 2015-04-08 ENCOUNTER — Other Ambulatory Visit: Payer: Self-pay | Admitting: Family Medicine

## 2015-04-08 ENCOUNTER — Encounter: Payer: Self-pay | Admitting: Family Medicine

## 2015-04-08 DIAGNOSIS — M545 Low back pain, unspecified: Secondary | ICD-10-CM

## 2015-04-08 DIAGNOSIS — M5442 Lumbago with sciatica, left side: Secondary | ICD-10-CM

## 2015-04-08 DIAGNOSIS — M79605 Pain in left leg: Secondary | ICD-10-CM

## 2015-04-08 NOTE — Telephone Encounter (Signed)
Pt does not want to be referred to [place] neurosurgery had a bad experience in past. Please refer to another neurosurgery

## 2015-04-08 NOTE — Telephone Encounter (Signed)
Referral placed.

## 2015-04-09 ENCOUNTER — Telehealth: Payer: Self-pay | Admitting: Family Medicine

## 2015-04-09 MED ORDER — TRAMADOL HCL 50 MG PO TABS: 50.0000 mg | ORAL_TABLET | Freq: Two times a day (BID) | ORAL | Status: DC

## 2015-04-09 MED ORDER — TRAMADOL HCL 50 MG PO TABS: 50.0000 mg | ORAL_TABLET | Freq: Three times a day (TID) | ORAL | Status: DC

## 2015-04-09 NOTE — Addendum Note (Signed)
Addended by: Westley Hummer B on: 04/09/2015 11:42 AM   Modules accepted: Orders

## 2015-04-09 NOTE — Telephone Encounter (Signed)
Dr Sherren Mocha has sent a mychart message.
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
## 2015-04-10 ENCOUNTER — Encounter: Payer: Self-pay | Admitting: Family Medicine

## 2015-04-13 ENCOUNTER — Telehealth: Payer: Self-pay

## 2015-04-13 NOTE — Telephone Encounter (Signed)
Pt is needing a copy of his spine xrays   Please call [phone] once ready to pick up

## 2015-04-14 NOTE — Telephone Encounter (Signed)
At the patient's request I made a copy of his x ray on CD , and placed on the pick up drawer. I also call to informed the patient.

## 2015-04-23 ENCOUNTER — Other Ambulatory Visit: Payer: Self-pay | Admitting: Physical Medicine and Rehabilitation

## 2015-04-23 DIAGNOSIS — M5416 Radiculopathy, lumbar region: Secondary | ICD-10-CM

## 2015-05-23 ENCOUNTER — Encounter (HOSPITAL_COMMUNITY): Payer: Self-pay | Admitting: Emergency Medicine

## 2015-05-23 ENCOUNTER — Emergency Department (HOSPITAL_COMMUNITY)
Admission: EM | Admit: 2015-05-23 | Discharge: 2015-05-23 | Disposition: A | Discharge status: Nursing Home (Includes ICF) | Payer: BLUE CROSS/BLUE SHIELD | Source: Home / Self Care | Attending: Family Medicine | Admitting: Family Medicine

## 2015-05-23 ENCOUNTER — Other Ambulatory Visit: Payer: Self-pay | Admitting: Family Medicine

## 2015-05-23 ENCOUNTER — Encounter (HOSPITAL_COMMUNITY): Payer: Self-pay

## 2015-05-23 ENCOUNTER — Emergency Department (HOSPITAL_COMMUNITY)
Admission: EM | Admit: 2015-05-23 | Discharge: 2015-05-23 | Disposition: A | Discharge status: Home / Self Care | Payer: BLUE CROSS/BLUE SHIELD | Attending: Emergency Medicine | Admitting: Emergency Medicine

## 2015-05-23 ENCOUNTER — Emergency Department (HOSPITAL_COMMUNITY): Payer: BLUE CROSS/BLUE SHIELD

## 2015-05-23 DIAGNOSIS — W01198A Fall on same level from slipping, tripping and stumbling with subsequent striking against other object, initial encounter: Secondary | ICD-10-CM | POA: Diagnosis not present

## 2015-05-23 DIAGNOSIS — F419 Anxiety disorder, unspecified: Secondary | ICD-10-CM | POA: Diagnosis not present

## 2015-05-23 DIAGNOSIS — M25512 Pain in left shoulder: Secondary | ICD-10-CM | POA: Diagnosis not present

## 2015-05-23 DIAGNOSIS — S1980XA Other specified injuries of unspecified part of neck, initial encounter: Secondary | ICD-10-CM

## 2015-05-23 DIAGNOSIS — S46812A Strain of other muscles, fascia and tendons at shoulder and upper arm level, left arm, initial encounter: Secondary | ICD-10-CM

## 2015-05-23 DIAGNOSIS — Z7952 Long term (current) use of systemic steroids: Secondary | ICD-10-CM | POA: Insufficient documentation

## 2015-05-23 DIAGNOSIS — Y93H2 Activity, gardening and landscaping: Secondary | ICD-10-CM | POA: Diagnosis not present

## 2015-05-23 DIAGNOSIS — Z79899 Other long term (current) drug therapy: Secondary | ICD-10-CM | POA: Insufficient documentation

## 2015-05-23 DIAGNOSIS — Z8781 Personal history of (healed) traumatic fracture: Secondary | ICD-10-CM | POA: Diagnosis not present

## 2015-05-23 DIAGNOSIS — I10 Essential (primary) hypertension: Secondary | ICD-10-CM | POA: Insufficient documentation

## 2015-05-23 DIAGNOSIS — Z880 Allergy status to penicillin: Secondary | ICD-10-CM | POA: Diagnosis not present

## 2015-05-23 DIAGNOSIS — Z87891 Personal history of nicotine dependence: Secondary | ICD-10-CM | POA: Diagnosis not present

## 2015-05-23 DIAGNOSIS — M62838 Other muscle spasm: Secondary | ICD-10-CM | POA: Diagnosis not present

## 2015-05-23 DIAGNOSIS — Z87828 Personal history of other (healed) physical injury and trauma: Secondary | ICD-10-CM | POA: Diagnosis not present

## 2015-05-23 DIAGNOSIS — J45909 Unspecified asthma, uncomplicated: Secondary | ICD-10-CM | POA: Diagnosis not present

## 2015-05-23 DIAGNOSIS — Z7951 Long term (current) use of inhaled steroids: Secondary | ICD-10-CM | POA: Insufficient documentation

## 2015-05-23 DIAGNOSIS — Z87442 Personal history of urinary calculi: Secondary | ICD-10-CM | POA: Diagnosis not present

## 2015-05-23 DIAGNOSIS — S46912A Strain of unspecified muscle, fascia and tendon at shoulder and upper arm level, left arm, initial encounter: Secondary | ICD-10-CM | POA: Insufficient documentation

## 2015-05-23 DIAGNOSIS — R200 Anesthesia of skin: Secondary | ICD-10-CM | POA: Diagnosis not present

## 2015-05-23 DIAGNOSIS — Y999 Unspecified external cause status: Secondary | ICD-10-CM | POA: Insufficient documentation

## 2015-05-23 DIAGNOSIS — Y9289 Other specified places as the place of occurrence of the external cause: Secondary | ICD-10-CM | POA: Diagnosis not present

## 2015-05-23 DIAGNOSIS — M542 Cervicalgia: Secondary | ICD-10-CM | POA: Diagnosis present

## 2015-05-23 MED ORDER — OXYCODONE HCL 5 MG PO TABS
5.0000 mg | ORAL_TABLET | Freq: Once | ORAL | Status: AC
  Administered 2015-05-23: 5 mg via ORAL
  Filled 2015-05-23: qty 1

## 2015-05-23 MED ORDER — ACETAMINOPHEN 500 MG PO TABS
1000.0000 mg | ORAL_TABLET | Freq: Once | ORAL | Status: AC
  Administered 2015-05-23: 1000 mg via ORAL
  Filled 2015-05-23: qty 2

## 2015-05-23 MED ORDER — IBUPROFEN 800 MG PO TABS
800.0000 mg | ORAL_TABLET | Freq: Once | ORAL | Status: AC
  Administered 2015-05-23: 800 mg via ORAL
  Filled 2015-05-23: qty 1

## 2015-05-23 MED ORDER — DIAZEPAM 5 MG PO TABS
5.0000 mg | ORAL_TABLET | Freq: Once | ORAL | Status: AC
  Administered 2015-05-23: 5 mg via ORAL
  Filled 2015-05-23: qty 1

## 2015-05-23 MED ORDER — DIAZEPAM 5 MG PO TABS: 2.5000 mg | ORAL_TABLET | Freq: Two times a day (BID) | ORAL | Status: DC

## 2015-05-23 NOTE — Discharge Instructions (Signed)
Take 4 over the counter ibuprofen tablets 3 times a day or 2 over-the-counter naproxen tablets twice a day for pain.  Muscle Strain A muscle strain is an injury that occurs when a muscle is stretched beyond its normal length. Usually a small number of muscle fibers are torn when this happens. Muscle strain is rated in degrees. First-degree strains have the least amount of muscle fiber tearing and pain. Second-degree and third-degree strains have increasingly more tearing and pain.  Usually, recovery from muscle strain takes 1-2 weeks. Complete healing takes 5-6 weeks.  CAUSES  Muscle strain happens when a sudden, violent force placed on a muscle stretches it too far. This may occur with lifting, sports, or a fall.  RISK FACTORS Muscle strain is especially common in athletes.  SIGNS AND SYMPTOMS At the site of the muscle strain, there may be:  Pain.  Bruising.  Swelling.  Difficulty using the muscle due to pain or lack of normal function. DIAGNOSIS  Your health care provider will perform a physical exam and ask about your medical history. TREATMENT  Often, the best treatment for a muscle strain is resting, icing, and applying cold compresses to the injured area.  HOME CARE INSTRUCTIONS   Use the PRICE method of treatment to promote muscle healing during the first 2-3 days after your injury. The PRICE method involves:  Protecting the muscle from being injured again.  Restricting your activity and resting the injured body part.  Icing your injury. To do this, put ice in a plastic bag. Place a towel between your skin and the bag. Then, apply the ice and leave it on from 15-20 minutes each hour. After the third day, switch to moist heat packs.  Apply compression to the injured area with a splint or elastic bandage. Be careful not to wrap it too tightly. This may interfere with blood circulation or increase swelling.  Elevate the injured body part above the level of your heart as often  as you can.  Only take over-the-counter or prescription medicines for pain, discomfort, or fever as directed by your health care provider.  Warming up prior to exercise helps to prevent future muscle strains. SEEK MEDICAL CARE IF:   You have increasing pain or swelling in the injured area.  You have numbness, tingling, or a significant loss of strength in the injured area. MAKE SURE YOU:   Understand these instructions.  Will watch your condition.  Will get help right away if you are not doing well or get worse.   This information is not intended to replace advice given to you by your health care provider. Make sure you discuss any questions you have with your health care provider.   Document Released: 03/14/2005 Document Revised: 01/02/2013 Document Reviewed: 10/11/2012 Elsevier Interactive Patient Education [employer].
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
## 2015-05-23 NOTE — ED Notes (Signed)
The patient presented to the [facility] with a complaint of neck pain secondary to a fall that occurred 2 days ago. The patient stated that he initially went to [facility] and was xrayed and referred to an Orthopaedic. The Orthopedic PA saw the patient the next day and was immediately referred to [facility] ED for a CT. The patient stated that he left after an 8 hour wait without seeing anyone in the ED. The patient presented to the [facility] today for a follow up CT.

## 2015-05-23 NOTE — ED Notes (Signed)
Pt left at this time with all belongings.  

## 2015-05-23 NOTE — ED Notes (Signed)
Pt states he fell two days ago while gardening outside and landed on a cement walkway. He went [facility] who then sent him to [facility] yesterday because he was having neck pain. Soft c -collar placed on him while there. He had  xrays done but is unsure of its results. He was then referred to [facility] and waited in waiting room for 8 hours but left without being seen. He states he was sent to [facility] to have a CT neck done.

## 2015-06-11 ENCOUNTER — Other Ambulatory Visit: Payer: Self-pay | Admitting: Family Medicine

## 2015-07-02 ENCOUNTER — Encounter: Payer: Self-pay | Admitting: Adult Health

## 2015-07-02 ENCOUNTER — Ambulatory Visit (INDEPENDENT_AMBULATORY_CARE_PROVIDER_SITE_OTHER): Payer: BLUE CROSS/BLUE SHIELD | Admitting: Adult Health

## 2015-07-02 VITALS — BP 150/98 | HR 66 | Temp 98.0°F | Wt 215.0 lb

## 2015-07-02 DIAGNOSIS — R0602 Shortness of breath: Secondary | ICD-10-CM

## 2015-07-02 DIAGNOSIS — R002 Palpitations: Secondary | ICD-10-CM

## 2015-07-02 DIAGNOSIS — I10 Essential (primary) hypertension: Secondary | ICD-10-CM | POA: Diagnosis not present

## 2015-07-02 NOTE — Patient Instructions (Signed)
It was great meeting you today!  Please go up on your Cozaar from 50 mg to 100mg . Monitor your blood pressure at home.   Someone will contact you about getting your Holter monirtor. This will tell us if your heart has any abnormal arrhythmias.   Follow up with Dr. Sherren Mocha as scheduled.

## 2015-07-02 NOTE — Progress Notes (Signed)
Pre visit review using our clinic review tool, if applicable. No additional management support is needed unless otherwise documented below in the visit note. 

## 2015-07-02 NOTE — Progress Notes (Signed)
   Subjective:    Patient ID: Ryan [name], male    DOB: 1985-03-31, 30 y.o.   MRN: [id number]  HPI  30 year old male who presents to the office today for elevated blood pressure and a feeling of " fluttering in my chest for the last 4 days." Yesterday he was at [employer] and he became dizzy and " felt like I was going to black out." He also reports that he has had dizziness for the last few days.   Additionally, over the last three days he has had SOB with and without exertion.   He denies any syncopal episodes, has not felt like he has had a fever.  He has been taking Cozaar " for 10 years".    Has a history of panic attacks - states " this sometimes feels like a panic attack."   BP Readings from Last 3 Encounters:  07/02/15 150/98  05/23/15 160/90  05/23/15 164/118     Review of Systems  Constitutional: Negative.   Respiratory: Positive for shortness of breath. Negative for apnea and chest tightness.   Cardiovascular: Positive for palpitations. Negative for chest pain and leg swelling.  Neurological: Positive for dizziness.  All other systems reviewed and are negative.      Objective:   Physical Exam  Constitutional: He is oriented to person, place, and time. He appears well-developed. No distress.  HENT:  Head: Normocephalic and atraumatic.  Right Ear: External ear normal.  Left Ear: External ear normal.  Nose: Nose normal.  Mouth/Throat: Oropharynx is clear and moist. No oropharyngeal exudate.  TM's visualized. No cerumen impaction. No infection noted.   Neck: Normal range of motion. Neck supple.  Cardiovascular: Normal rate, regular rhythm, normal heart sounds and intact distal pulses.  Exam reveals no gallop and no friction rub.   No murmur heard. Pulmonary/Chest: Effort normal and breath sounds normal. No respiratory distress. He has no wheezes. He has no rales. He exhibits no tenderness.  Lymphadenopathy:    He has no cervical adenopathy.    Neurological: He is alert and oriented to person, place, and time.  Skin: Skin is warm and dry. No rash noted. He is not diaphoretic. No erythema. No pallor.  Psychiatric: He has a normal mood and affect. His behavior is normal. Judgment and thought content normal.  Nursing note and vitals reviewed.     Assessment & Plan:  1. Palpitations - EKG 12-Lead- Sinus Bradycardia, Rate- Rate 59 - Holter monitor - 48 hour; Future - Follow up with Dr. Sherren Mocha in 1-2 weeks.  2. SOB (shortness of breath) - possibly panic attacks.  - Advised to work on deep breathing exercises.   3. Essential hypertension - Increase Cozaar from 50 mg to 100mg  - EKG 12-Lead

## 2015-07-10 ENCOUNTER — Ambulatory Visit (INDEPENDENT_AMBULATORY_CARE_PROVIDER_SITE_OTHER): Payer: BLUE CROSS/BLUE SHIELD

## 2015-07-10 DIAGNOSIS — R002 Palpitations: Secondary | ICD-10-CM

## 2015-07-14 ENCOUNTER — Ambulatory Visit: Payer: BLUE CROSS/BLUE SHIELD | Admitting: Family Medicine

## 2015-07-20 ENCOUNTER — Ambulatory Visit (INDEPENDENT_AMBULATORY_CARE_PROVIDER_SITE_OTHER): Payer: BLUE CROSS/BLUE SHIELD | Admitting: Family Medicine

## 2015-07-20 ENCOUNTER — Encounter: Payer: Self-pay | Admitting: Family Medicine

## 2015-07-20 VITALS — BP 130/80 | Temp 97.9°F | Wt 216.0 lb

## 2015-07-20 DIAGNOSIS — I10 Essential (primary) hypertension: Secondary | ICD-10-CM | POA: Diagnosis not present

## 2015-07-20 DIAGNOSIS — F410 Panic disorder [episodic paroxysmal anxiety] without agoraphobia: Secondary | ICD-10-CM | POA: Diagnosis not present

## 2015-07-20 MED ORDER — LOSARTAN POTASSIUM 100 MG PO TABS: 100.0000 mg | ORAL_TABLET | Freq: Every day | ORAL | Status: DC

## 2015-07-20 NOTE — Progress Notes (Signed)
Pre visit review using our clinic review tool, if applicable. No additional management support is needed unless otherwise documented below in the visit note. 

## 2015-07-20 NOTE — Patient Instructions (Signed)
Cozaar 100 mg......... one tablet daily in the morning  Return when necessary

## 2015-07-20 NOTE — Progress Notes (Signed)
   Subjective:    Patient ID: Ryan [name], male    DOB: 02-02-1986, 30 y.o.   MRN: [id number]  HPI Ryan [name] is a 30 year old single male nonsmoker comes in today for follow-up of hypertension  He was seen here last by Georgina Snell for evaluation of elevated blood pressures and palpitations. Had a spell where he felt lightheaded and short of breath. He's had spells like this in the past. He's been diagnosed with panic attacks however he said he didn't have any prodrome like he normally does. I explained to him you can have a panic attack without a prodrome  He had a 24-hour Holter monitor results pending but I'm sure that skin to be okay   Review of Systems Review of systems negative    Objective:   Physical Exam  Well-developed well-nourished male no acute distress vital signs stable he is afebrile BP 130/80 on Cozaar increased to 100 mg daily      Assessment & Plan:  Hypertension ago on Cozaar 100 mg daily.......... continue that dose return when necessary

## 2015-08-10 ENCOUNTER — Telehealth: Payer: Self-pay | Admitting: Family Medicine

## 2015-08-10 NOTE — Telephone Encounter (Signed)
[facility] Night - Client [facility] Patient Name: Ryan [name] A Gender: Male DOB: Dec 13, 1985 Age: 30 Y 11 M 24 D Return Phone Number: [phone] (Primary) Address: City/State/Zip: [place] [address] Client [facility] Night - Client Client Site [facility] - Night Physician Christie Nottingham - MD Contact Type Call Who Is Calling Patient / Member / Family / Caregiver Call Type Triage / Clinical Relationship To Patient Self Return Phone Number [phone] (Primary) Chief Complaint Urine, Blood In Reason for Call Symptomatic / Request for [place] states he is not having pain, but he is urinating blood. PreDisposition Call Doctor Translation No Nurse Assessment Nurse: Einar Gip, RN, Neoma Laming Date/Time Eilene Ghazi Time): 08/09/2015 5:32:18 PM Confirm and document reason for call. If symptomatic, describe symptoms. You must click the next button to save text entered. ---Caller states he has history of kidney stones and has blood in his urine- urine is red. No pain. Has the patient traveled out of the country within the last 30 days? ---No Does the patient have any new or worsening symptoms? ---Yes Will a triage be completed? ---Yes Related visit to physician within the last 2 weeks? ---No Does the PT have any chronic conditions? (i.e. diabetes, asthma, etc.) ---Yes List chronic conditions. ---kidney stones, hypertension, asthma- no current symptoms Is this a behavioral health or substance abuse call? ---No Guidelines Guideline Title Affirmed Question Affirmed Notes Nurse Date/Time (Eastern Time) Urine - Blood In Passing pure blood or large blood clots (i.e., size > a dime) (Exception: fleck or small strands) Daoud, RN, Neoma Laming 08/09/2015 5:33:36 PM Disp. Time Eilene Ghazi Time) Disposition Final User 08/09/2015 5:27:31 PM Attempt made -  message left Einar Gip, RN, Neoma Laming 08/09/2015 5:35:26 PM Go to ED Now Yes Einar Gip, RN, Neoma Laming PLEASE NOTE: All timestamps contained within this report are represented as [place] Standard Time. CONFIDENTIALTY NOTICE: This fax transmission is intended only for the addressee. It contains information that is legally privileged, confidential or otherwise protected from use or disclosure. If you are not the intended recipient, you are strictly prohibited from reviewing, disclosing, copying using or disseminating any of this information or taking any action in reliance on or regarding this information. If you have received this fax in error, please notify us immediately by telephone so that we can arrange for its return to [place]. Phone: [phone], Toll-Free: [phone], Fax: [phone] Page: 2 of 2 Call Id: [id number] Caller Understands: Yes Disagree/Comply: Disagree Disagree/Comply Reason: Disagree with instructions Care Advice Given Per Guideline GO TO ED NOW: You need to be seen in the Emergency Department. Go to the ER at ___________ [place] now. Drive carefully. DRIVING: Another adult should drive. BRING MEDICINES: * Please bring a list of your current medicines when you go to the Emergency Department (ER). * It is also a good idea to bring the pill bottles too. This will help the doctor to make certain you are taking the right medicines and the right dose. CARE ADVICE given per Urine, Blood In (Adult) guideline. Comments User: Ernie Avena, RN Date/Time Eilene Ghazi Time): 08/09/2015 5:36:16 PM Caller declines ER outcome and Telethealth opportunity stating he will call the office in the am. Referrals GO TO FACILITY REFUSED

## 2015-08-10 NOTE — Telephone Encounter (Signed)
Left a message for a return call.

## 2015-08-11 NOTE — Telephone Encounter (Signed)
Left a message for a return call.

## 2015-08-11 NOTE — Telephone Encounter (Signed)
Spoke to the pt.  When he called he stated that he was having some abdominal pain and blood in his urine.  Now he states that his sx have resolved.  Denies back, flank, and abdominal pain.  No fever or blood in his urine.  Advised that he call back if sx return.  Pt agreed.

## 2015-08-11 NOTE — Telephone Encounter (Signed)
Pt returned your call.  

## 2015-12-02 ENCOUNTER — Other Ambulatory Visit: Payer: Self-pay | Admitting: Family Medicine

## 2015-12-23 ENCOUNTER — Ambulatory Visit (INDEPENDENT_AMBULATORY_CARE_PROVIDER_SITE_OTHER): Payer: BLUE CROSS/BLUE SHIELD | Admitting: Family Medicine

## 2015-12-23 ENCOUNTER — Encounter: Payer: Self-pay | Admitting: Family Medicine

## 2015-12-23 DIAGNOSIS — Z23 Encounter for immunization: Secondary | ICD-10-CM | POA: Diagnosis not present

## 2015-12-23 DIAGNOSIS — R5382 Chronic fatigue, unspecified: Secondary | ICD-10-CM | POA: Diagnosis not present

## 2015-12-23 MED ORDER — MONTELUKAST SODIUM 10 MG PO TABS: 10.0000 mg | ORAL_TABLET | Freq: Every day | ORAL | 3 refills | Status: DC

## 2015-12-23 MED ORDER — LOSARTAN POTASSIUM 100 MG PO TABS: 100.0000 mg | ORAL_TABLET | Freq: Every day | ORAL | 3 refills | Status: DC

## 2015-12-23 MED ORDER — FLUTICASONE PROPIONATE 50 MCG/ACT NA SUSP: 1.0000 | Freq: Two times a day (BID) | NASAL | 6 refills | Status: DC

## 2015-12-23 MED ORDER — FLUTICASONE PROPIONATE (INHAL) 50 MCG/BLIST IN AEPB: 1.0000 | INHALATION_SPRAY | Freq: Two times a day (BID) | RESPIRATORY_TRACT | 4 refills | Status: DC

## 2015-12-23 MED ORDER — PREDNISONE 20 MG PO TABS: ORAL_TABLET | ORAL | 1 refills | Status: DC

## 2015-12-23 NOTE — Progress Notes (Signed)
Pre visit review using our clinic review tool, if applicable. No additional management support is needed unless otherwise documented below in the visit note. 

## 2015-12-23 NOTE — Patient Instructions (Signed)
Continue basic medications  Prednisone 20 mg............. 2 tabs for 3 days, 1 tab for 3 days, half a tab for 3 days, then half a tablet Monday Wednesday Friday for a three-week taper  Return when necessary

## 2015-12-23 NOTE — Progress Notes (Signed)
Ryan [name] is a 30 year old single male nonsmoker who comes in today for evaluation of fatigue  He has a history of allergic rhinitis uses steroid nasal spray twice daily, Flovent 1 puff twice a day because of a history of asthma, Singulair 10 mg daily. About 3 weeks ago he noticed fatigue K congestion some slight wheezing. No fever nausea vomiting diarrhea.  Review of systems otherwise negative except he is on antihypertensive medication losartan 100 mg daily BP is normal at home.  Social history he continues to work with his father in the [employer] [employer] they travel a lot. Next rapid [place]  Vital signs stable he is afebrile HEENT was negative except for marked bilateral nasal edema neck was supple no adenopathy lungs are clear except for some mild late expiratory wheezing with forced expiration  Impression  #1 allergic rhinitis.......... continue steroid nasal spray  #2 asthma.......Marland Kitchen mild flare...Marland KitchenMarland KitchenMarland Kitchen continue inhaler..... Add steroid burst and taper

## 2015-12-30 ENCOUNTER — Telehealth: Payer: Self-pay | Admitting: Family Medicine

## 2015-12-30 NOTE — Telephone Encounter (Signed)
Called and spoke with pt. Per dr.Todd continue the medication as prescribed. Pt verbalized understanding.

## 2015-12-30 NOTE — Telephone Encounter (Signed)
[facility] Day - Client [facility] Patient Name: Ryan [name] DOB: Feb 25, 1986 Initial Comment Caller was dx with allergy problems. The caller is not feeling any better. Should the caller continue the prednisone? Or reduce the prednisone? Nurse Assessment Nurse: Markus Daft, RN, Sherre Poot Date/Time (Eastern Time): 12/30/2015 9:26:19 AM Confirm and document reason for call. If symptomatic, describe symptoms. You must click the next button to save text entered. ---Caller was diagnosed with seasonal allergy problems 5-6 days. He was fatigued, dizzy, headaches, sneezing [name] lot, an occasional cough at night, faint wheeze, and crackling in his chest from his asthma causing some chest discomfort. - He is feeling [name] little better as he is not as tired anymore. Dizziness and HA is gone. Allergy s/s cont. Should the caller continue or reduce the prednisone? He has started reducing the meds, but wondering if he should be reducing it yet. Last night he had worsening chest discomfort, but none now. Worse CP than it had been. No SOB. Has the patient traveled out of the country within the last 30 days? ---Not Applicable Does the patient have any new or worsening symptoms? ---Yes Will [name] triage be completed? ---Yes Related visit to physician within the last 2 weeks? ---Yes Does the PT have any chronic conditions? (i.e. diabetes, asthma, etc.) ---Yes List chronic conditions. ---HTN, asthma, seasonal allergies - taking Allegra Is this [name] behavioral health or substance abuse call? ---No Guidelines Guideline Title Affirmed Question Affirmed Notes Asthma Attack [1] Nasal discharge AND [2] present > 10 days Final Disposition User See Physician within [place], [place], [place] Comments He hates having to use his powder inhaler - ? disc - uses dailiy, but has not been as completely as it doesn't feel like he's getting relief (insurance  requires this type). He has Albuterol but has not used it. He will be out of town, and so plans to go to an UC tomorrow AM after he tries the Albuterol inhaler to see if he gets any relief. Referrals GO TO FACILITY UNDECIDED Disagree/Comply: Comply

## 2015-12-30 NOTE — Telephone Encounter (Signed)
Please ask Dr. Sherren Mocha about message.

## 2016-01-04 ENCOUNTER — Telehealth: Payer: Self-pay | Admitting: Family Medicine

## 2016-01-04 NOTE — Telephone Encounter (Signed)
Pt not feeling any better and want to know if he should come back in to be seen.  Pt would like a call back

## 2016-01-05 NOTE — Telephone Encounter (Signed)
Per Dr. Sherren Mocha appointment would be fine.

## 2016-01-05 NOTE — Telephone Encounter (Signed)
Pt scheduled  

## 2016-01-06 ENCOUNTER — Encounter: Payer: Self-pay | Admitting: Family Medicine

## 2016-01-06 ENCOUNTER — Ambulatory Visit (INDEPENDENT_AMBULATORY_CARE_PROVIDER_SITE_OTHER): Payer: BLUE CROSS/BLUE SHIELD | Admitting: Family Medicine

## 2016-01-06 VITALS — BP 152/96 | HR 75 | Temp 98.2°F | Wt 218.2 lb

## 2016-01-06 DIAGNOSIS — J0120 Acute ethmoidal sinusitis, unspecified: Secondary | ICD-10-CM

## 2016-01-06 DIAGNOSIS — J45909 Unspecified asthma, uncomplicated: Secondary | ICD-10-CM

## 2016-01-06 MED ORDER — LEVOFLOXACIN 500 MG PO TABS: 500.0000 mg | ORAL_TABLET | Freq: Every day | ORAL | 0 refills | Status: DC

## 2016-01-06 NOTE — Progress Notes (Signed)
Pre visit review using our clinic review tool, if applicable. No additional management support is needed unless otherwise documented below in the visit note. 

## 2016-01-06 NOTE — Patient Instructions (Signed)
Prednisone 20 mg............ one tablet daily for 3 days, half a tab for 3 days, then a half a tab Monday Wednesday Friday for a two-week taper  Levaquin...Marland KitchenMarland Kitchen 1 daily for 1 week  Return when necessary

## 2016-01-06 NOTE — Progress Notes (Signed)
Ryan [name] is a 30 year old male who comes in today for follow-up of asthma  We saw him a week ago with asthma. We started him on a short course of prednisone. He stayed on 40 mg a day because he still doesn't feel well. His cough is not that bad B skeletal on a head congestion green drainage and facial pain.  U of systems otherwise negative  Physical examination vital signs stable he is afebrile HEENT were negative neck was supple no adenopathy lungs are amazingly clear except for some very mildly wheezing on forced expiration  Impression #1 asthma resolving  #2 sinusitis......... treat with one week course of antibiotics

## 2016-01-16 ENCOUNTER — Encounter: Payer: Self-pay | Admitting: Family Medicine

## 2016-01-25 ENCOUNTER — Ambulatory Visit: Payer: BLUE CROSS/BLUE SHIELD | Admitting: Family Medicine

## 2016-01-29 ENCOUNTER — Telehealth: Payer: Self-pay | Admitting: Family Medicine

## 2016-01-29 NOTE — Telephone Encounter (Signed)
Patient Name: Ryan [name]  DOB: 1985/04/13    Initial Comment Caller says, seen his Dr 2x in the last 3 weeks, he has a wrap around pain in the Lt side of his chest. Now he has it on both sides and in the middle of his back.    Nurse Assessment  Nurse: Raphael Gibney, RN, Vanita Ingles Date/Time (Eastern Time): 01/29/2016 9:20:22 AM  Confirm and document reason for call. If symptomatic, describe symptoms. You must click the next button to save text entered. ---Caller states he has seen doctor x 2 in the past 3 weeks for pain and allergy infection. allergy infection is better. History of lower back pain. Now has pain in the center of his back and it radiates to both sides around his rib areas. Pain level 3 right now. Pain intensifies at times to level 5-6. Pain is constant.  Has the patient traveled out of the country within the last 30 days? ---No  Does the patient have any new or worsening symptoms? ---Yes  Will a triage be completed? ---Yes  Related visit to physician within the last 2 weeks? ---Yes  Does the PT have any chronic conditions? (i.e. diabetes, asthma, etc.) ---Yes  List chronic conditions. ---asthma; HTN  Is this a behavioral health or substance abuse call? ---No     Guidelines    Guideline Title Affirmed Question Affirmed Notes  Flank Pain [1] MILD pain (i.e., scale 1-3; does not interfere with normal activities) AND [2] present > 3 days    Final Disposition User   See PCP When Office is Open (within 3 days) Raphael Gibney, RN, Vera    Comments  Dr. Sherren Mocha is not in the office today. He does not want to see another provider but states he will go to urgent care at [facility]   Disagree/Comply: Comply

## 2016-01-29 NOTE — Telephone Encounter (Signed)
LMTCB as pt has not been seen in UC yet

## 2016-02-01 NOTE — Telephone Encounter (Signed)
Spoke with pt and he still c/o constant pain 3/10. Pt scheduled with Dr. Sherren Mocha tomorrow. Nothing further needed at this time.

## 2016-02-02 ENCOUNTER — Encounter: Payer: Self-pay | Admitting: Family Medicine

## 2016-02-02 ENCOUNTER — Ambulatory Visit (INDEPENDENT_AMBULATORY_CARE_PROVIDER_SITE_OTHER): Payer: BLUE CROSS/BLUE SHIELD | Admitting: Family Medicine

## 2016-02-02 ENCOUNTER — Ambulatory Visit (INDEPENDENT_AMBULATORY_CARE_PROVIDER_SITE_OTHER)
Admission: RE | Admit: 2016-02-02 | Discharge: 2016-02-02 | Disposition: A | Discharge status: Home / Self Care | Payer: BLUE CROSS/BLUE SHIELD | Source: Ambulatory Visit | Attending: Family Medicine | Admitting: Family Medicine

## 2016-02-02 VITALS — BP 160/90 | HR 67 | Temp 98.0°F | Wt 219.9 lb

## 2016-02-02 DIAGNOSIS — G8929 Other chronic pain: Secondary | ICD-10-CM

## 2016-02-02 DIAGNOSIS — M546 Pain in thoracic spine: Secondary | ICD-10-CM | POA: Diagnosis not present

## 2016-02-02 DIAGNOSIS — R0789 Other chest pain: Secondary | ICD-10-CM

## 2016-02-02 DIAGNOSIS — J45909 Unspecified asthma, uncomplicated: Secondary | ICD-10-CM | POA: Diagnosis not present

## 2016-02-02 DIAGNOSIS — I10 Essential (primary) hypertension: Secondary | ICD-10-CM | POA: Diagnosis not present

## 2016-02-02 MED ORDER — TRAMADOL HCL 50 MG PO TABS: ORAL_TABLET | ORAL | 1 refills | Status: DC

## 2016-02-02 NOTE — Progress Notes (Signed)
Ryan [name] is a 30 year old male nonsmoker who comes in today for evaluation of persistent chest wall pain  We saw him a month ago with chest wall pain and unfortunately is persisted. He's been taking it 100 mg Motrin twice a day with no relief.  He actually has 2 kinds of chest wall pain. 1 type is a dull sometimes sharp pain that occurs in the right and the left breast area.  A second type is he points to the T8 through T 10 area posterior spine. This is also sharp sometimes dull. A 3-4 constantly and then and increases to a 6. Pain is decreased by heat and Motrin increased by movement or activities. No history of trauma. The pain now wakes him up at night.  He does have history of allergic rhinitis and asthma. He's on his inhaler 1 puff twice a day Flovent.  He also takes Cozaar 100 mg daily for hypertension BP today 160/90. He's not monitoring his blood pressure at home.  He's had no fever chills nausea vomiting weight loss hemoptysis etc. etc. Again no history of chest wall trauma  Physical evaluation  Examination HEENT were negative neck was supple no adenopathy thyroid normal. Pulmonary exam there symmetrical breath sounds there is some very faint wheezing with forced expiration. Breath sounds are symmetrical. Palpation of the anterior chest wall elicits some tenderness in the midclavicular line right and left ninth 10th ribs.  Outpatient spine shows no tenderness however he has cement tremendous tenderness in the paraspinal muscles around T10 T12.  Impression chest wall pain persistent despite conservative therapy,,,,,,,,, chest x-ray spine films,,, if normal then refer to physical therapy for pain relief  Hypertension question goal,,,,,, continue Cozaar 100 mg daily,,,,,,,, BP check at home daily for 3 weeks return if blood pressure not at goal  Mild asthma,,,,,,,,,, continue Flovent 1 puff twice a day

## 2016-02-02 NOTE — Patient Instructions (Addendum)
Continue the Motrin 800 mg twice daily  Tramadol 50 mg.. 1/2-1 tablet at bedtime for nighttime pain  Go to the main office today for your chest x-ray and thoracic spine films........Marland Kitchen we will call you the report  The next step is a physical therapy evaluation to relieve your pain .....Marland KitchenMarland Kitchen

## 2016-02-02 NOTE — Progress Notes (Signed)
Pre visit review using our clinic review tool, if applicable. No additional management support is needed unless otherwise documented below in the visit note. 

## 2016-02-03 ENCOUNTER — Telehealth: Payer: Self-pay | Admitting: Family Medicine

## 2016-02-03 DIAGNOSIS — M545 Low back pain: Secondary | ICD-10-CM

## 2016-02-03 NOTE — Telephone Encounter (Signed)
Pt would like to know if Dr Reola Mosher thinks the swelling in his back could have been there all along and the back injections just masked the pain. Pt states the pain / tightness has increased overnight and pain is worse.  Pt had xrays yesterday and wants to know if results are back/  Pt would like a call back asap.

## 2016-02-03 NOTE — Telephone Encounter (Signed)
Spoke with Dr Sherren Mocha regarding pt's message. He states that pt's xr is normal. At this point he recommends referral to neuro-surgeon and marked urgent.   Spoke with pt and gave results and recommendations. Pt agrees to referral. Advised pt to try and control pain with medications, heat/ice and rest. Pt agrees. Nothing further needed at this time.

## 2016-02-09 ENCOUNTER — Encounter: Payer: Self-pay | Admitting: Family Medicine

## 2016-02-09 ENCOUNTER — Ambulatory Visit: Payer: BLUE CROSS/BLUE SHIELD | Admitting: Physical Therapy

## 2016-02-17 ENCOUNTER — Other Ambulatory Visit: Payer: Self-pay | Admitting: Family Medicine

## 2016-02-17 DIAGNOSIS — Z1159 Encounter for screening for other viral diseases: Secondary | ICD-10-CM

## 2016-02-25 ENCOUNTER — Encounter: Payer: Self-pay | Admitting: Family Medicine

## 2016-02-25 DIAGNOSIS — R5383 Other fatigue: Secondary | ICD-10-CM

## 2016-02-29 ENCOUNTER — Other Ambulatory Visit (INDEPENDENT_AMBULATORY_CARE_PROVIDER_SITE_OTHER): Payer: BLUE CROSS/BLUE SHIELD

## 2016-02-29 DIAGNOSIS — Z1159 Encounter for screening for other viral diseases: Secondary | ICD-10-CM | POA: Diagnosis not present

## 2016-02-29 DIAGNOSIS — R5383 Other fatigue: Secondary | ICD-10-CM

## 2016-02-29 LAB — BASIC METABOLIC PANEL
BUN: 11 mg/dL (ref 6–23)
CO2: 21 mEq/L (ref 19–32)
Calcium: 9.6 mg/dL (ref 8.4–10.5)
Chloride: 107 mEq/L (ref 96–112)
Creatinine, Ser: 0.72 mg/dL (ref 0.40–1.50)
GFR: 134.01 mL/min (ref >60.00)
Glucose, Bld: 93 mg/dL (ref 70–99)
Potassium: 3.9 mEq/L (ref 3.5–5.1)
Sodium: 138 mEq/L (ref 135–145)

## 2016-02-29 LAB — TSH: TSH: 0.38 u[IU]/mL (ref 0.35–4.50)

## 2016-02-29 LAB — CBC WITH DIFFERENTIAL/PLATELET
Basophils Absolute: 0.1 10*3/uL (ref 0.0–0.1)
Basophils Relative: 0.8 % (ref 0.0–3.0)
Eosinophils Absolute: 0.2 10*3/uL (ref 0.0–0.7)
Eosinophils Relative: 2.4 % (ref 0.0–5.0)
HCT: 43 % (ref 39.0–52.0)
Hemoglobin: 14.8 g/dL (ref 13.0–17.0)
Lymphocytes Relative: 34.2 % (ref 12.0–46.0)
Lymphs Abs: 3.2 10*3/uL (ref 0.7–4.0)
MCHC: 34.4 g/dL (ref 30.0–36.0)
MCV: 91.9 fl (ref 78.0–100.0)
Monocytes Absolute: 0.7 10*3/uL (ref 0.1–1.0)
Monocytes Relative: 7.8 % (ref 3.0–12.0)
Neutro Abs: 5.1 10*3/uL (ref 1.4–7.7)
Neutrophils Relative %: 54.8 % (ref 43.0–77.0)
Platelets: 309 10*3/uL (ref 150.0–400.0)
RBC: 4.68 Mil/uL (ref 4.22–5.81)
RDW: 12.7 % (ref 11.5–15.5)
WBC: 9.3 10*3/uL (ref 4.0–10.5)

## 2016-03-01 LAB — IRON,TIBC AND FERRITIN PANEL
%SAT: 25 % (ref 15–60)
Ferritin: 213 ng/mL (ref 20–345)
Iron: 86 ug/dL (ref 50–180)
TIBC: 351 ug/dL (ref 250–425)

## 2016-03-01 LAB — HEPATITIS C ANTIBODY: HCV Ab: NEGATIVE

## 2016-03-02 ENCOUNTER — Encounter: Payer: Self-pay | Admitting: Family Medicine

## 2016-03-02 NOTE — Telephone Encounter (Signed)
Per Lucina Mellow, I asked Dr Sarajane Jews to review pt labs. He states that all are normal. Replied to pt's MyChart message with this information. Nothing further needed at this time.

## 2016-04-05 ENCOUNTER — Telehealth: Payer: Self-pay | Admitting: Family Medicine

## 2016-04-05 NOTE — Telephone Encounter (Signed)
Per Dr. Sherren Mocha he wants pt to continue with prednisone. Pt states okay he will continue.

## 2016-04-05 NOTE — Telephone Encounter (Signed)
Pt states he has the same sinus issue now, as he had  the past 2-3 months. Pt still has prednisone, but is requesting the abx Dr Sherren Mocha ordered a few months back.   [facility] [address]

## 2016-04-18 ENCOUNTER — Encounter: Payer: Self-pay | Admitting: Family Medicine

## 2016-04-18 DIAGNOSIS — R42 Dizziness and giddiness: Secondary | ICD-10-CM

## 2016-04-18 DIAGNOSIS — R067 Sneezing: Secondary | ICD-10-CM

## 2016-05-27 ENCOUNTER — Telehealth: Payer: Self-pay | Admitting: Family Medicine

## 2016-05-27 NOTE — Telephone Encounter (Signed)
[facility] - Client [facility]     Patient Name: Ryan [name] Initial Comment Caller states they have been vomiting.  DOB: Oct 21, 1985      Nurse Assessment  Nurse: Richardson Landry, RN, Aldona Bar Date/Time (Eastern Time): 05/27/2016 4:03:24 PM  Confirm and document reason for call. If symptomatic, describe symptoms. ---Caller states he went to [place] on a business trip on Tuesday, Wednesday began feeling sick, can keep liquids down, will vomit if he eats solids. Stools have more mucus in them and yellow.   Does the patient have any new or worsening symptoms? ---Yes   Will a triage be completed? ---Yes   Related visit to physician within the last 2 weeks? ---No   Does the PT have any chronic conditions? (i.e. diabetes, asthma, etc.) ---Yes   List chronic conditions. ---hx stomach ulcer/ possible mallory-weiss tear.   Is this a behavioral health or substance abuse call? ---No     Guidelines     Guideline Title Affirmed Question Affirmed Notes   Abdominal Pain - Male [1] MILD-MODERATE pain AND [2] constant AND [3] present > 2 hours    Final Disposition User   See Physician within 4 Hours (or PCP triage) Richardson Landry, RN, Samantha   Comments   hx of kidney stones may be contributing.   Referrals   GO TO FACILITY OTHER - SPECIFY   Disagree/Comply: Comply

## 2016-05-30 NOTE — Telephone Encounter (Signed)
Spoke with pt and he states that he is feeling better. He reports symptoms lasted about 5 days. He thinks it was likely food poisoning. He does not feel he needs to be seen at this time. Advised pt if he does not continue to improve to contact office. Pt voiced understanding. Nothing further needed.

## 2016-08-15 ENCOUNTER — Ambulatory Visit
Admission: RE | Admit: 2016-08-15 | Discharge: 2016-08-15 | Disposition: A | Discharge status: Home / Self Care | Payer: BLUE CROSS/BLUE SHIELD | Source: Ambulatory Visit | Attending: Family Medicine | Admitting: Family Medicine

## 2016-08-15 ENCOUNTER — Encounter: Payer: Self-pay | Admitting: Family Medicine

## 2016-08-15 ENCOUNTER — Telehealth: Payer: Self-pay | Admitting: Family Medicine

## 2016-08-15 ENCOUNTER — Ambulatory Visit (INDEPENDENT_AMBULATORY_CARE_PROVIDER_SITE_OTHER): Payer: BLUE CROSS/BLUE SHIELD | Admitting: Family Medicine

## 2016-08-15 VITALS — BP 140/100 | HR 60 | Temp 97.7°F | Ht 69.75 in | Wt 224.0 lb

## 2016-08-15 DIAGNOSIS — R1030 Lower abdominal pain, unspecified: Secondary | ICD-10-CM

## 2016-08-15 DIAGNOSIS — N200 Calculus of kidney: Secondary | ICD-10-CM | POA: Diagnosis not present

## 2016-08-15 DIAGNOSIS — R109 Unspecified abdominal pain: Secondary | ICD-10-CM

## 2016-08-15 DIAGNOSIS — R3129 Other microscopic hematuria: Secondary | ICD-10-CM | POA: Diagnosis not present

## 2016-08-15 DIAGNOSIS — N132 Hydronephrosis with renal and ureteral calculous obstruction: Secondary | ICD-10-CM

## 2016-08-15 LAB — CBC WITH DIFFERENTIAL/PLATELET
Basophils Absolute: 0.1 10*3/uL (ref 0.0–0.1)
Basophils Relative: 1 % (ref 0.0–3.0)
Eosinophils Absolute: 0.2 10*3/uL (ref 0.0–0.7)
Eosinophils Relative: 2.1 % (ref 0.0–5.0)
HCT: 44.6 % (ref 39.0–52.0)
Hemoglobin: 15.7 g/dL (ref 13.0–17.0)
Lymphocytes Relative: 26.5 % (ref 12.0–46.0)
Lymphs Abs: 3 10*3/uL (ref 0.7–4.0)
MCHC: 35.1 g/dL (ref 30.0–36.0)
MCV: 91.2 fl (ref 78.0–100.0)
Monocytes Absolute: 0.9 10*3/uL (ref 0.1–1.0)
Monocytes Relative: 7.5 % (ref 3.0–12.0)
Neutro Abs: 7.1 10*3/uL (ref 1.4–7.7)
Neutrophils Relative %: 62.9 % (ref 43.0–77.0)
Platelets: 316 10*3/uL (ref 150.0–400.0)
RBC: 4.89 Mil/uL (ref 4.22–5.81)
RDW: 12.6 % (ref 11.5–15.5)
WBC: 11.4 10*3/uL — ABNORMAL HIGH (ref 4.0–10.5)

## 2016-08-15 LAB — POCT URINALYSIS DIPSTICK
Bilirubin, UA: NEGATIVE
Blood, UA: 200
Glucose, UA: NEGATIVE
Ketones, UA: NEGATIVE
Leukocytes, UA: NEGATIVE
Nitrite, UA: NEGATIVE
Protein, UA: NEGATIVE
Spec Grav, UA: 1.015 (ref 1.010–1.025)
Urobilinogen, UA: 0.2 E.U./dL
pH, UA: 6 (ref 5.0–8.0)

## 2016-08-15 LAB — COMPREHENSIVE METABOLIC PANEL
ALT: 44 U/L (ref 0–53)
AST: 26 U/L (ref 0–37)
Albumin: 4.8 g/dL (ref 3.5–5.2)
Alkaline Phosphatase: 107 U/L (ref 39–117)
BUN: 11 mg/dL (ref 6–23)
CO2: 20 mEq/L (ref 19–32)
Calcium: 9.8 mg/dL (ref 8.4–10.5)
Chloride: 108 mEq/L (ref 96–112)
Creatinine, Ser: 0.96 mg/dL (ref 0.40–1.50)
GFR: 95.88 mL/min (ref >60.00)
Glucose, Bld: 99 mg/dL (ref 70–99)
Potassium: 3.8 mEq/L (ref 3.5–5.1)
Sodium: 138 mEq/L (ref 135–145)
Total Bilirubin: 0.4 mg/dL (ref 0.2–1.2)
Total Protein: 7.5 g/dL (ref 6.0–8.3)

## 2016-08-15 LAB — URINALYSIS, MICROSCOPIC ONLY

## 2016-08-15 MED ORDER — OXYCODONE-ACETAMINOPHEN 5-325 MG PO TABS: 1.0000 | ORAL_TABLET | Freq: Three times a day (TID) | ORAL | 0 refills | Status: DC | PRN

## 2016-08-15 MED ORDER — TAMSULOSIN HCL 0.4 MG PO CAPS: 0.4000 mg | ORAL_CAPSULE | Freq: Every day | ORAL | 3 refills | Status: DC

## 2016-08-15 MED ORDER — KETOROLAC TROMETHAMINE 60 MG/2ML IM SOLN
60.0000 mg | Freq: Once | INTRAMUSCULAR | Status: AC
  Administered 2016-08-15: 60 mg via INTRAMUSCULAR

## 2016-08-15 NOTE — Telephone Encounter (Signed)
Forwarding to [employer] as Juluis Rainier.
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
## 2016-08-15 NOTE — Patient Instructions (Addendum)
   Please go straight to [facility] at [address], we will notify you of CT and lab results

## 2016-08-15 NOTE — Telephone Encounter (Signed)
Patient called in saying he could not get in touch with his PCP at another location. Patient stated he has had kidney stone issues in the past and believes he is having them again. Scheduled him with Tor Netters this morning at 10 am.

## 2016-08-16 LAB — URINE CULTURE: Organism ID, Bacteria: NO GROWTH

## 2016-09-01 ENCOUNTER — Emergency Department (HOSPITAL_COMMUNITY)
Admission: EM | Admit: 2016-09-01 | Discharge: 2016-09-02 | Disposition: A | Discharge status: Home / Self Care | Payer: BLUE CROSS/BLUE SHIELD | Attending: Emergency Medicine | Admitting: Emergency Medicine

## 2016-09-01 ENCOUNTER — Emergency Department (HOSPITAL_COMMUNITY): Payer: BLUE CROSS/BLUE SHIELD

## 2016-09-01 ENCOUNTER — Encounter (HOSPITAL_COMMUNITY): Payer: Self-pay | Admitting: Emergency Medicine

## 2016-09-01 DIAGNOSIS — R109 Unspecified abdominal pain: Secondary | ICD-10-CM

## 2016-09-01 DIAGNOSIS — I10 Essential (primary) hypertension: Secondary | ICD-10-CM | POA: Diagnosis not present

## 2016-09-01 DIAGNOSIS — Z87891 Personal history of nicotine dependence: Secondary | ICD-10-CM | POA: Diagnosis not present

## 2016-09-01 DIAGNOSIS — N132 Hydronephrosis with renal and ureteral calculous obstruction: Secondary | ICD-10-CM

## 2016-09-01 DIAGNOSIS — N200 Calculus of kidney: Secondary | ICD-10-CM | POA: Diagnosis not present

## 2016-09-01 DIAGNOSIS — J45909 Unspecified asthma, uncomplicated: Secondary | ICD-10-CM | POA: Insufficient documentation

## 2016-09-01 DIAGNOSIS — R112 Nausea with vomiting, unspecified: Secondary | ICD-10-CM

## 2016-09-01 DIAGNOSIS — R1030 Lower abdominal pain, unspecified: Secondary | ICD-10-CM | POA: Diagnosis present

## 2016-09-01 DIAGNOSIS — R319 Hematuria, unspecified: Secondary | ICD-10-CM | POA: Diagnosis not present

## 2016-09-01 LAB — CBC WITH DIFFERENTIAL/PLATELET
Basophils Absolute: 0.1 10*3/uL (ref 0.0–0.1)
Basophils Relative: 0 %
Eosinophils Absolute: 0 10*3/uL (ref 0.0–0.7)
Eosinophils Relative: 0 %
HCT: 52.7 % — ABNORMAL HIGH (ref 39.0–52.0)
Hemoglobin: 19.5 g/dL — ABNORMAL HIGH (ref 13.0–17.0)
Lymphocytes Relative: 8 %
Lymphs Abs: 1.6 10*3/uL (ref 0.7–4.0)
MCH: 32.5 pg (ref 26.0–34.0)
MCHC: 37 g/dL — ABNORMAL HIGH (ref 30.0–36.0)
MCV: 87.8 fL (ref 78.0–100.0)
Monocytes Absolute: 1.2 10*3/uL — ABNORMAL HIGH (ref 0.1–1.0)
Monocytes Relative: 6 %
Neutro Abs: 18.4 10*3/uL — ABNORMAL HIGH (ref 1.7–7.7)
Neutrophils Relative %: 87 %
Platelets: 342 10*3/uL (ref 150–400)
RBC: 6 MIL/uL — ABNORMAL HIGH (ref 4.22–5.81)
WBC: 21.3 10*3/uL — ABNORMAL HIGH (ref 4.0–10.5)

## 2016-09-01 LAB — COMPREHENSIVE METABOLIC PANEL
ALT: 48 U/L (ref 17–63)
AST: 28 U/L (ref 15–41)
Albumin: 5.2 g/dL — ABNORMAL HIGH (ref 3.5–5.0)
Alkaline Phosphatase: 100 U/L (ref 38–126)
Anion gap: 15 (ref 5–15)
BUN: 11 mg/dL (ref 6–20)
CO2: 18 mmol/L — ABNORMAL LOW (ref 22–32)
Calcium: 10.1 mg/dL (ref 8.9–10.3)
Chloride: 105 mmol/L (ref 101–111)
Creatinine, Ser: 1.02 mg/dL (ref 0.61–1.24)
GFR calc Af Amer: >60 mL/min (ref >60)
GFR calc non Af Amer: >60 mL/min (ref >60)
Glucose, Bld: 148 mg/dL — ABNORMAL HIGH (ref 65–99)
Potassium: 3.1 mmol/L — ABNORMAL LOW (ref 3.5–5.1)
Sodium: 138 mmol/L (ref 135–145)
Total Bilirubin: 0.9 mg/dL (ref 0.3–1.2)
Total Protein: 8.2 g/dL — ABNORMAL HIGH (ref 6.5–8.1)

## 2016-09-01 LAB — URINALYSIS, ROUTINE W REFLEX MICROSCOPIC
Glucose, UA: NEGATIVE mg/dL
Ketones, ur: 5 mg/dL — AB
Leukocytes, UA: NEGATIVE
Nitrite: NEGATIVE
Protein, ur: 100 mg/dL — AB
Specific Gravity, Urine: 1.03 (ref 1.005–1.030)
pH: 5 (ref 5.0–8.0)

## 2016-09-01 LAB — LIPASE, BLOOD: Lipase: 24 U/L (ref 11–51)

## 2016-09-01 MED ORDER — METOCLOPRAMIDE HCL 5 MG/ML IJ SOLN
10.0000 mg | Freq: Once | INTRAMUSCULAR | Status: AC
  Administered 2016-09-01: 10 mg via INTRAVENOUS
  Filled 2016-09-01: qty 2

## 2016-09-01 MED ORDER — ONDANSETRON 8 MG PO TBDP
8.0000 mg | ORAL_TABLET | Freq: Once | ORAL | Status: AC | PRN
  Administered 2016-09-01: 8 mg via ORAL
  Filled 2016-09-01: qty 1

## 2016-09-01 MED ORDER — DIPHENHYDRAMINE HCL 50 MG/ML IJ SOLN
25.0000 mg | Freq: Once | INTRAMUSCULAR | Status: AC
  Administered 2016-09-01: 25 mg via INTRAVENOUS
  Filled 2016-09-01: qty 1

## 2016-09-01 MED ORDER — SODIUM CHLORIDE 0.9 % IV BOLUS (SEPSIS)
1000.0000 mL | Freq: Once | INTRAVENOUS | Status: AC
  Administered 2016-09-01: 1000 mL via INTRAVENOUS

## 2016-09-01 MED ORDER — KETOROLAC TROMETHAMINE 30 MG/ML IJ SOLN
30.0000 mg | Freq: Once | INTRAMUSCULAR | Status: AC
  Administered 2016-09-01: 30 mg via INTRAVENOUS
  Filled 2016-09-01: qty 1

## 2016-09-01 NOTE — ED Triage Notes (Signed)
Patient had kidney stones a few weeks ago. Patient states he is nausea and vomiting. He states he has pass eight kidney stones.
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
## 2016-09-02 MED ORDER — OXYCODONE-ACETAMINOPHEN 5-325 MG PO TABS: 1.0000 | ORAL_TABLET | Freq: Three times a day (TID) | ORAL | 0 refills | Status: DC | PRN

## 2016-09-02 MED ORDER — METOCLOPRAMIDE HCL 10 MG PO TABS: 10.0000 mg | ORAL_TABLET | Freq: Four times a day (QID) | ORAL | 0 refills | Status: DC | PRN

## 2016-09-02 MED ORDER — MORPHINE SULFATE (PF) 2 MG/ML IV SOLN
4.0000 mg | Freq: Once | INTRAVENOUS | Status: AC
  Administered 2016-09-02: 4 mg via INTRAVENOUS
  Filled 2016-09-02: qty 2

## 2016-09-02 MED ORDER — SODIUM CHLORIDE 0.9 % IV BOLUS (SEPSIS)
1000.0000 mL | Freq: Once | INTRAVENOUS | Status: AC
  Administered 2016-09-02: 1000 mL via INTRAVENOUS

## 2016-09-02 NOTE — Discharge Instructions (Signed)
Your ultrasound did not show any swelling of the kidneys, however, ear pain could certainly be from passing small stones. Your urine did not show any definite signs of infection, but has been sent for culture. Culture results will be available in 2 days. If the culture is positive, we will contact you. Please keep your appointment with the urologist for later this morning.

## 2016-09-03 LAB — URINE CULTURE: Culture: NO GROWTH

## 2016-09-06 ENCOUNTER — Ambulatory Visit (INDEPENDENT_AMBULATORY_CARE_PROVIDER_SITE_OTHER): Payer: BLUE CROSS/BLUE SHIELD | Admitting: Family Medicine

## 2016-09-06 ENCOUNTER — Encounter: Payer: Self-pay | Admitting: Family Medicine

## 2016-09-06 VITALS — BP 140/90 | HR 74 | Temp 98.2°F | Wt 212.1 lb

## 2016-09-06 DIAGNOSIS — M791 Myalgia, unspecified site: Secondary | ICD-10-CM

## 2016-09-06 DIAGNOSIS — E876 Hypokalemia: Secondary | ICD-10-CM | POA: Diagnosis not present

## 2016-09-06 DIAGNOSIS — D72829 Elevated white blood cell count, unspecified: Secondary | ICD-10-CM | POA: Diagnosis not present

## 2016-09-06 DIAGNOSIS — R233 Spontaneous ecchymoses: Secondary | ICD-10-CM | POA: Diagnosis not present
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
## 2016-09-06 NOTE — Patient Instructions (Addendum)
Follow up promptly for any fever, increased rash, or other new symptoms.   Go ahead and start medication prescribed by Dr Edwena Blow

## 2016-09-07 ENCOUNTER — Encounter: Payer: Self-pay | Admitting: Family Medicine

## 2016-09-07 LAB — COMPREHENSIVE METABOLIC PANEL
ALT: 37 U/L (ref 0–53)
AST: 22 U/L (ref 0–37)
Albumin: 4.6 g/dL (ref 3.5–5.2)
Alkaline Phosphatase: 84 U/L (ref 39–117)
BUN: 8 mg/dL (ref 6–23)
CO2: 21 mEq/L (ref 19–32)
Calcium: 9.9 mg/dL (ref 8.4–10.5)
Chloride: 109 mEq/L (ref 96–112)
Creatinine, Ser: 0.8 mg/dL (ref 0.40–1.50)
GFR: 118.28 mL/min (ref >60.00)
Glucose, Bld: 99 mg/dL (ref 70–99)
Potassium: 3.6 mEq/L (ref 3.5–5.1)
Sodium: 140 mEq/L (ref 135–145)
Total Bilirubin: 0.4 mg/dL (ref 0.2–1.2)
Total Protein: 6.9 g/dL (ref 6.0–8.3)

## 2016-09-07 LAB — CBC WITH DIFFERENTIAL/PLATELET
Basophils Absolute: 0.1 10*3/uL (ref 0.0–0.1)
Basophils Relative: 0.7 % (ref 0.0–3.0)
Eosinophils Absolute: 0.3 10*3/uL (ref 0.0–0.7)
Eosinophils Relative: 3.4 % (ref 0.0–5.0)
HCT: 41.9 % (ref 39.0–52.0)
Hemoglobin: 14.8 g/dL (ref 13.0–17.0)
Lymphocytes Relative: 34.2 % (ref 12.0–46.0)
Lymphs Abs: 3.1 10*3/uL (ref 0.7–4.0)
MCHC: 35.4 g/dL (ref 30.0–36.0)
MCV: 90.5 fl (ref 78.0–100.0)
Monocytes Absolute: 0.8 10*3/uL (ref 0.1–1.0)
Monocytes Relative: 9.1 % (ref 3.0–12.0)
Neutro Abs: 4.8 10*3/uL (ref 1.4–7.7)
Neutrophils Relative %: 52.6 % (ref 43.0–77.0)
Platelets: 337 10*3/uL (ref 150.0–400.0)
RBC: 4.63 Mil/uL (ref 4.22–5.81)
RDW: 12.4 % (ref 11.5–15.5)
WBC: 9.1 10*3/uL (ref 4.0–10.5)

## 2016-09-07 LAB — SEDIMENTATION RATE: Sed Rate: 2 mm/hr (ref 0–15)

## 2016-09-08 ENCOUNTER — Other Ambulatory Visit: Payer: Self-pay

## 2016-09-08 ENCOUNTER — Ambulatory Visit (INDEPENDENT_AMBULATORY_CARE_PROVIDER_SITE_OTHER): Payer: BLUE CROSS/BLUE SHIELD | Admitting: Family Medicine

## 2016-09-08 ENCOUNTER — Other Ambulatory Visit: Payer: Self-pay | Admitting: Family Medicine

## 2016-09-08 ENCOUNTER — Telehealth: Payer: Self-pay | Admitting: Family Medicine

## 2016-09-08 ENCOUNTER — Encounter: Payer: Self-pay | Admitting: Family Medicine

## 2016-09-08 VITALS — BP 138/96 | HR 59 | Temp 98.0°F | Ht 72.0 in | Wt 211.0 lb

## 2016-09-08 DIAGNOSIS — R21 Rash and other nonspecific skin eruption: Secondary | ICD-10-CM | POA: Diagnosis not present

## 2016-09-08 MED ORDER — DOXYCYCLINE HYCLATE 100 MG PO CAPS: 100.0000 mg | ORAL_CAPSULE | Freq: Two times a day (BID) | ORAL | 0 refills | Status: AC

## 2016-09-08 MED ORDER — LIDOCAINE 5 % EX OINT: 1.0000 "application " | TOPICAL_OINTMENT | CUTANEOUS | 2 refills | Status: DC | PRN

## 2016-09-08 MED ORDER — TRAMADOL HCL 50 MG PO TABS: 50.0000 mg | ORAL_TABLET | Freq: Three times a day (TID) | ORAL | 0 refills | Status: DC | PRN

## 2016-09-08 NOTE — Patient Instructions (Signed)
WE NOW OFFER    [facility] FAST TRACK!!!  SAME DAY Appointments for ACUTE CARE  Such as: Sprains, Injuries, cuts, abrasions, rashes, muscle pain, joint pain, back pain Colds, flu, sore throats, headache, allergies, cough, fever  Ear pain, sinus and eye infections Abdominal pain, nausea, vomiting, diarrhea, upset stomach Animal/insect bites  3 Easy Ways to Schedule: Walk-In Scheduling Call in scheduling Mychart Sign-up: [email or web address]         

## 2016-09-08 NOTE — Progress Notes (Signed)
   Subjective:    Patient ID: Ryan [name], male    DOB: 1985/04/18, 31 y.o.   MRN: [id number]  HPI Here to follow up on a group of symtoms including a spot of rash on the right trunk which is very painful ans well as generalized body aches. No fevers. He was seen in the ER on 09-01-16 and his WBC count that day was elevated to 21. No clear etiology was found. He then saw Dr. Elease Hashimoto on 09-06-16 and he felt about the same. His WBC that day had returned to normal at 9. He still has body aches and has severe pain in the right trunk where the rash is located. No fever. He has good food and fluid intake. No headaches. He has been taking Ibuprofen and Tylenol around the clock. He even took a few oxycodone tablets he had from treatment for recent kidney stones but these did not help much either.   Review of Systems  Constitutional: Negative.   Respiratory: Negative.   Cardiovascular: Negative.   Gastrointestinal: Negative.   Musculoskeletal: Positive for myalgias.  Skin: Positive for rash.  Neurological: Negative.        Objective:   Physical Exam  Constitutional: He is oriented to person, place, and time.  He looks very uncomfortable  Cardiovascular: Normal rate, regular rhythm, normal heart sounds and intact distal pulses.   Pulmonary/Chest: Effort normal and breath sounds normal.  Abdominal: Soft. Bowel sounds are normal. He exhibits no distension and no mass. There is no tenderness. There is no rebound and no guarding.  Musculoskeletal: He exhibits no edema.  Neurological: He is alert and oriented to person, place, and time.  Skin:  There is a 8 cm area on macular erythema on the right flank which is warm to the touch and very tender. No vesicles or papules seen          Assessment & Plan:  He has a painful spot of rash and myalgias, and the etiology of these is not apparent. This is definitely not shingles. It could be a cellulitis. We will cover with Doxycycline for 10 days  and check antibodies for Lyme disease. Add topical Lidoderm for pain relief.  Alysia Penna, MD

## 2016-09-08 NOTE — Telephone Encounter (Signed)
Rx has been called in as directed and patient has been notified. Thanks!

## 2016-09-08 NOTE — Telephone Encounter (Signed)
Pt states the  lidocaine (XYLOCAINE) 5 % ointment  is $80 .  Pt states he needs some sort of pain relief.  Advised pt to call his insurance company to see what is covered, but pt declined, stating anything at this point that is affordable for pain relief.  Pt would like something either topical or oral for pain.  [id number] [address], [place]

## 2016-09-08 NOTE — Telephone Encounter (Signed)
Call in Tramadol 50 mg to take 2 tabs every 6 hours prn pain, #60 with no rf

## 2016-09-09 ENCOUNTER — Encounter: Payer: Self-pay | Admitting: Family Medicine

## 2016-09-09 LAB — LYME AB/WESTERN BLOT REFLEX: B burgdorferi Ab IgG+IgM: <0.9 Index (ref <0.90)

## 2016-09-12 NOTE — Telephone Encounter (Signed)
Pt states he is returning Dr Erick Blinks call. Pt states the rash is better, but the pain is not.   Pt declined to make a follow up with Dr Sarajane Jews, stating he prefers to stay with Dr Elease Hashimoto for this issue.  No appts today with Dr Elease Hashimoto Please advise

## 2016-09-13 ENCOUNTER — Ambulatory Visit (INDEPENDENT_AMBULATORY_CARE_PROVIDER_SITE_OTHER): Payer: BLUE CROSS/BLUE SHIELD | Admitting: Family Medicine

## 2016-09-13 ENCOUNTER — Encounter: Payer: Self-pay | Admitting: Family Medicine

## 2016-09-13 VITALS — BP 110/80 | HR 82 | Temp 98.1°F | Wt 215.7 lb

## 2016-09-13 DIAGNOSIS — N132 Hydronephrosis with renal and ureteral calculous obstruction: Secondary | ICD-10-CM | POA: Diagnosis not present

## 2016-09-13 DIAGNOSIS — R21 Rash and other nonspecific skin eruption: Secondary | ICD-10-CM | POA: Diagnosis not present

## 2016-09-13 DIAGNOSIS — N200 Calculus of kidney: Secondary | ICD-10-CM

## 2016-09-13 DIAGNOSIS — M5416 Radiculopathy, lumbar region: Secondary | ICD-10-CM | POA: Diagnosis not present

## 2016-09-13 MED ORDER — OXYCODONE-ACETAMINOPHEN 5-325 MG PO TABS: 1.0000 | ORAL_TABLET | Freq: Three times a day (TID) | ORAL | 0 refills | Status: DC | PRN

## 2016-09-13 MED ORDER — PREDNISONE 10 MG PO TABS
ORAL_TABLET | ORAL | 0 refills | Status: DC
Start: 2016-09-13 — End: 2016-10-04

## 2016-09-13 NOTE — Telephone Encounter (Signed)
It certainly would not hurt to cover for possible shingles, so call in Valtrex 1000 mg TID for 10 days

## 2016-09-13 NOTE — Patient Instructions (Signed)
Sciatica Sciatica is pain, numbness, weakness, or tingling along the path of the sciatic nerve. The sciatic nerve starts in the lower back and runs down the back of each leg. The nerve controls the muscles in the lower leg and in the back of the knee. It also provides feeling (sensation) to the back of the thigh, the lower leg, and the sole of the foot. Sciatica is a symptom of another medical condition that pinches or puts pressure on the sciatic nerve. Generally, sciatica only affects one side of the body. Sciatica usually goes away on its own or with treatment. In some cases, sciatica may keep coming back (recur). What are the causes? This condition is caused by pressure on the sciatic nerve, or pinching of the sciatic nerve. This may be the result of:  A disk in between the bones of the spine (vertebrae) bulging out too far (herniated disk).  Age-related changes in the spinal disks (degenerative disk disease).  A pain disorder that affects a muscle in the buttock (piriformis syndrome).  Extra bone growth (bone spur) near the sciatic nerve.  An injury or break (fracture) of the pelvis.  Pregnancy.  Tumor (rare). What increases the risk? The following factors may make you more likely to develop this condition:  Playing sports that place pressure or stress on the spine, such as football or weight lifting.  Having poor strength and flexibility.  A history of back injury.  A history of back surgery.  Sitting for long periods of time.  Doing activities that involve repetitive bending or lifting.  Obesity. What are the signs or symptoms? Symptoms can vary from mild to very severe, and they may include:  Any of these problems in the lower back, leg, hip, or buttock:  Mild tingling or dull aches.  Burning sensations.  Sharp pains.  Numbness in the back of the calf or the sole of the foot.  Leg weakness.  Severe back pain that makes movement difficult. These symptoms may  get worse when you cough, sneeze, or laugh, or when you sit or stand for long periods of time. Being overweight may also make symptoms worse. In some cases, symptoms may recur over time. How is this diagnosed? This condition may be diagnosed based on:  Your symptoms.  A physical exam. Your health care provider may ask you to do certain movements to check whether those movements trigger your symptoms.  You may have tests, including:  Blood tests.  X-rays.  MRI.  CT scan. How is this treated? In many cases, this condition improves on its own, without any treatment. However, treatment may include:  Reducing or modifying physical activity during periods of pain.  Exercising and stretching to strengthen your abdomen and improve the flexibility of your spine.  Icing and applying heat to the affected area.  Medicines that help:  To relieve pain and swelling.  To relax your muscles.  Injections of medicines that help to relieve pain, irritation, and inflammation around the sciatic nerve (steroids).  Surgery. Follow these instructions at home: Medicines   Take over-the-counter and prescription medicines only as told by your health care provider.  Do not drive or operate heavy machinery while taking prescription pain medicine. Managing pain   If directed, apply ice to the affected area.  Put ice in a plastic bag.  Place a towel between your skin and the bag.  Leave the ice on for 20 minutes, 2-3 times a day.  After icing, apply heat to the   affected area before you exercise or as often as told by your health care provider. Use the heat source that your health care provider recommends, such as a moist heat pack or a heating pad.  Place a towel between your skin and the heat source.  Leave the heat on for 20-30 minutes.  Remove the heat if your skin turns bright red. This is especially important if you are unable to feel pain, heat, or cold. You may have a greater risk of  getting burned. Activity   Return to your normal activities as told by your health care provider. Ask your health care provider what activities are safe for you.  Avoid activities that make your symptoms worse.  Take brief periods of rest throughout the day. Resting in a lying or standing position is usually better than sitting to rest.  When you rest for longer periods, mix in some mild activity or stretching between periods of rest. This will help to prevent stiffness and pain.  Avoid sitting for long periods of time without moving. Get up and move around at least one time each hour.  Exercise and stretch regularly, as told by your health care provider.  Do not lift anything that is heavier than 10 lb (4.5 kg) while you have symptoms of sciatica. When you do not have symptoms, you should still avoid heavy lifting, especially repetitive heavy lifting.  When you lift objects, always use proper lifting technique, which includes:  Bending your knees.  Keeping the load close to your body.  Avoiding twisting. General instructions   Use good posture.  Avoid leaning forward while sitting.  Avoid hunching over while standing.  Maintain a healthy weight. Excess weight puts extra stress on your back and makes it difficult to maintain good posture.  Wear supportive, comfortable shoes. Avoid wearing high heels.  Avoid sleeping on a mattress that is too soft or too hard. A mattress that is firm enough to support your back when you sleep may help to reduce your pain.  Keep all follow-up visits as told by your health care provider. This is important. Contact a health care provider if:  You have pain that wakes you up when you are sleeping.  You have pain that gets worse when you lie down.  Your pain is worse than you have experienced in the past.  Your pain lasts longer than 4 weeks.  You experience unexplained weight loss. Get help right away if:  You lose control of your bowel  or bladder (incontinence).  You have:  Weakness in your lower back, pelvis, buttocks, or legs that gets worse.  Redness or swelling of your back.  A burning sensation when you urinate. This information is not intended to replace advice given to you by your health care provider. Make sure you discuss any questions you have with your health care provider. Document Released: 03/08/2001 Document Revised: 08/18/2015 Document Reviewed: 11/21/2014 Elsevier Interactive Patient Education  2017 [employer]  
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
## 2016-09-23 ENCOUNTER — Encounter: Payer: Self-pay | Admitting: Family Medicine

## 2016-09-23 ENCOUNTER — Telehealth: Payer: Self-pay | Admitting: Family Medicine

## 2016-09-23 NOTE — Telephone Encounter (Signed)
Advised patient to go to ortho walk-in clinic

## 2016-09-23 NOTE — Telephone Encounter (Signed)
Pt states he cannot get into [facility] until 7/12. Would like a sooner appt with someone else.   Pt states the right side and leg hurts very much.
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
## 2016-09-27 ENCOUNTER — Encounter: Payer: Self-pay | Admitting: Family Medicine

## 2016-10-03 ENCOUNTER — Encounter: Payer: Self-pay | Admitting: Family Medicine

## 2016-10-03 ENCOUNTER — Telehealth: Payer: Self-pay | Admitting: Family Medicine

## 2016-10-03 NOTE — Telephone Encounter (Signed)
° °  Pt call to say he saw a Urologist today and he was told he did not have kidney stones that looks like he has diverticulitis and he is asking if he need to see a GI doctor

## 2016-10-04 ENCOUNTER — Encounter: Payer: Self-pay | Admitting: Family Medicine

## 2016-10-04 ENCOUNTER — Ambulatory Visit (INDEPENDENT_AMBULATORY_CARE_PROVIDER_SITE_OTHER): Payer: BLUE CROSS/BLUE SHIELD | Admitting: Family Medicine

## 2016-10-04 VITALS — BP 130/80 | HR 76 | Temp 98.2°F | Wt 213.7 lb

## 2016-10-04 DIAGNOSIS — R1030 Lower abdominal pain, unspecified: Secondary | ICD-10-CM

## 2016-10-04 DIAGNOSIS — M545 Low back pain, unspecified: Secondary | ICD-10-CM

## 2016-10-04 NOTE — Patient Instructions (Signed)
We will set up  MRI to further assess your pain.

## 2016-10-04 NOTE — Telephone Encounter (Signed)
Left detailed message machine patient should call GI or schedule an appointment here.
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
## 2016-10-05 ENCOUNTER — Encounter: Payer: Self-pay | Admitting: Family Medicine

## 2016-10-06 ENCOUNTER — Encounter: Payer: Self-pay | Admitting: Family Medicine

## 2016-10-12 ENCOUNTER — Ambulatory Visit
Admission: RE | Admit: 2016-10-12 | Discharge: 2016-10-12 | Disposition: A | Discharge status: Home / Self Care | Payer: BLUE CROSS/BLUE SHIELD | Source: Ambulatory Visit | Attending: Family Medicine | Admitting: Family Medicine

## 2016-10-12 DIAGNOSIS — M545 Low back pain, unspecified: Secondary | ICD-10-CM

## 2016-10-13 ENCOUNTER — Encounter: Payer: Self-pay | Admitting: Family Medicine

## 2016-10-14 ENCOUNTER — Telehealth: Payer: Self-pay | Admitting: Family Medicine

## 2016-10-14 NOTE — Telephone Encounter (Signed)
Patient called to advise that his urine culture on 11-Sep-2016 was coded incorrectly and insurance would not cover. I have emailed Dawn with coding on updating the code and re-filing to the insurance. Awaiting a response.

## 2016-10-17 NOTE — Telephone Encounter (Signed)
Patient has been made aware of the note below. No further action required.

## 2016-10-17 NOTE — Telephone Encounter (Signed)
I called and left a voicemail for the patient to call the office to discuss the note below.   I need to advise him that I was able to speak to the coding department who looked into the account and notified me that "the charge for the urine on 08/15/16 has been written off and he is not receiving a bill for this. Looks like he has a bill for $4.09 which is for his co-insurance for lab work he had on 09/06/16."  Awaiting patient's call back.

## 2016-10-20 ENCOUNTER — Encounter: Payer: Self-pay | Admitting: Family Medicine

## 2016-10-21 ENCOUNTER — Other Ambulatory Visit: Payer: Self-pay | Admitting: *Deleted

## 2016-10-21 DIAGNOSIS — R109 Unspecified abdominal pain: Secondary | ICD-10-CM

## 2016-10-31 ENCOUNTER — Telehealth: Payer: Self-pay | Admitting: Physician Assistant

## 2016-10-31 NOTE — Telephone Encounter (Signed)
Lower abdominal discomfort. Hurts with certain foods such as nuts, seeds and red meats. He has noticed if he holds or pushes in on his abdomien his pain increased. He has difficult painful bowel movements and then sometimes loose bowel movements. He has not noticed blood with or after his bowel movement. Denies fevers or nausea. Appointment moved to Wednesday 11/02/16 at 3 pm.

## 2016-11-02 ENCOUNTER — Ambulatory Visit (INDEPENDENT_AMBULATORY_CARE_PROVIDER_SITE_OTHER): Payer: BLUE CROSS/BLUE SHIELD | Admitting: Gastroenterology

## 2016-11-02 VITALS — BP 140/92 | HR 88 | Ht 71.26 in | Wt 216.2 lb

## 2016-11-02 DIAGNOSIS — R102 Pelvic and perineal pain: Secondary | ICD-10-CM | POA: Diagnosis not present

## 2016-11-02 DIAGNOSIS — R1032 Left lower quadrant pain: Secondary | ICD-10-CM

## 2016-11-02 DIAGNOSIS — K5732 Diverticulitis of large intestine without perforation or abscess without bleeding: Secondary | ICD-10-CM | POA: Diagnosis not present

## 2016-11-03 ENCOUNTER — Encounter: Payer: Self-pay | Admitting: Gastroenterology

## 2016-11-03 DIAGNOSIS — K5732 Diverticulitis of large intestine without perforation or abscess without bleeding: Secondary | ICD-10-CM | POA: Insufficient documentation

## 2016-11-03 DIAGNOSIS — R102 Pelvic and perineal pain: Secondary | ICD-10-CM | POA: Insufficient documentation

## 2016-11-03 DIAGNOSIS — R1032 Left lower quadrant pain: Secondary | ICD-10-CM | POA: Insufficient documentation

## 2016-11-03 MED ORDER — HYDROCODONE-ACETAMINOPHEN 5-325 MG PO TABS: 1.0000 | ORAL_TABLET | Freq: Four times a day (QID) | ORAL | 0 refills | Status: DC | PRN

## 2016-11-03 MED ORDER — NA SULFATE-K SULFATE-MG SULF 17.5-3.13-1.6 GM/177ML PO SOLN: ORAL | 0 refills | Status: DC
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
## 2016-11-04 ENCOUNTER — Telehealth: Payer: Self-pay | Admitting: Gastroenterology

## 2016-11-04 NOTE — Telephone Encounter (Signed)
The pt was advised to try jello or broth when he takes his medications and also use his reglan if needed for nausea.  He will call back if this does not help.

## 2016-11-05 ENCOUNTER — Telehealth: Payer: Self-pay | Admitting: Internal Medicine

## 2016-11-05 NOTE — Telephone Encounter (Signed)
Concerned he may have Crohn's disease  I told him no evidence to suggest that at this time  He will stick with the treatment plan

## 2016-11-07 ENCOUNTER — Telehealth: Payer: Self-pay | Admitting: Gastroenterology

## 2016-11-07 NOTE — Telephone Encounter (Signed)
Pt continues to have abd pain under the left rib cage at the same level as it was when he was seen on 8/8.  He did advance his diet to solids yesterday.  He is still on cipro and flagy with 4 days left.  The antispasmodic causes him to "shake" really bad.  When he did take it the pressure subsided.  States his stools are "yellow with mucous" for the past year. Has only 1 bm a day but twice weekly may have some mucous in it.  Vicodin at night to sleep but does not alleviate the pain. He also states he has "canker sores" in his mouth as well as "sores on his anus" for 1 year at least.  He is not sure if this is related but wanted to mention it.   Please advise.

## 2016-11-08 ENCOUNTER — Telehealth: Payer: Self-pay | Admitting: Gastroenterology

## 2016-11-08 ENCOUNTER — Ambulatory Visit: Payer: BLUE CROSS/BLUE SHIELD | Admitting: Physician Assistant

## 2016-11-08 NOTE — Telephone Encounter (Signed)
Ok to schedule colonoscopy sooner. Thanks

## 2016-11-08 NOTE — Telephone Encounter (Signed)
Hey.  I saw this patient last week.  I am currently treating him for possible diverticulitis for complaints of LLQ abdominal but had 2 non-contrast CT scans that did not show this (but had diverticulosis).  He is scheduled for colonoscopy in October because I wanted to push it out a bit in case this was diverticulitis, but he continues to complain of pain despite some antibiotics.  Do you think that we should push his colonoscopy up sooner?  He also reported these other complaints to the nurse, but told me that he was moving is bowels fine.  I guess he could have Crohn's but CT scans did not suggest that either.  Thank you,  Jess

## 2016-11-08 NOTE — Telephone Encounter (Signed)
Patient reports that his call was already returned and he has no further concerns this am

## 2016-11-08 NOTE — Progress Notes (Signed)
Reviewed and agree with documentation and assessment and plan. K. Veena Nandigam , MD   

## 2016-11-08 NOTE — Telephone Encounter (Signed)
Contacted the patient and moved the procedure to 11/21/16 arrive at 8:30 am.

## 2016-11-09 ENCOUNTER — Telehealth: Payer: Self-pay | Admitting: *Deleted

## 2016-11-09 ENCOUNTER — Telehealth: Payer: Self-pay | Admitting: Gastroenterology

## 2016-11-09 NOTE — Telephone Encounter (Signed)
Called the patient and left a message. The patient had called this morning asking for a refill of the Vicodin 5/325 mg that Alonza Bogus PA had prescribed on 11-02-2016. She only prescribed 10 tablets with no refills. In her office note, she did tell the patient she would not prescribe any more refills of the Vicodin. I informed the patient of this on my  Message.

## 2016-11-09 NOTE — Telephone Encounter (Signed)
I LM for the patient to advise I am waiting for the prep samples to come in.  I also let him know I called the pharmacy to advise we do not do prior authorizations on the  Preps.  I told the patient I will call him when I get the sample.

## 2016-11-09 NOTE — Telephone Encounter (Signed)
Todd calling from [facility]  states Suprep requires prior authorization.

## 2016-11-09 NOTE — Telephone Encounter (Signed)
I called the pharmacist to advise that we do not do prior authorization's on the colonoscopy preps.  I will call the patient to let him know when our samples of Suprep come in .

## 2016-11-11 ENCOUNTER — Telehealth: Payer: Self-pay | Admitting: Internal Medicine

## 2016-11-11 NOTE — Telephone Encounter (Signed)
Pt states if he eats any food at all his stomach hurts. He says anything solid causes abdominal pain. Pt wanted to know if we have figured out what is causing his pain. Pt is scheduled for a colon on 11/21/16. Discussed with him that we would need to see what the procedure shows, he is aware.

## 2016-11-14 ENCOUNTER — Encounter: Payer: Self-pay | Admitting: Gastroenterology

## 2016-11-16 ENCOUNTER — Telehealth: Payer: Self-pay | Admitting: *Deleted

## 2016-11-16 NOTE — Telephone Encounter (Signed)
I advised the patient we did get prep samples in.  He is coming in today to pick up his Suprep sample. Thanked me for calling him to let him know sample is at the front desk.

## 2016-11-21 ENCOUNTER — Ambulatory Visit (AMBULATORY_SURGERY_CENTER): Payer: BLUE CROSS/BLUE SHIELD | Admitting: Gastroenterology

## 2016-11-21 ENCOUNTER — Encounter: Payer: Self-pay | Admitting: Gastroenterology

## 2016-11-21 VITALS — BP 106/66 | HR 56 | Temp 98.9°F | Resp 13 | Ht 71.25 in | Wt 216.0 lb

## 2016-11-21 DIAGNOSIS — D128 Benign neoplasm of rectum: Secondary | ICD-10-CM | POA: Diagnosis not present

## 2016-11-21 DIAGNOSIS — K5730 Diverticulosis of large intestine without perforation or abscess without bleeding: Secondary | ICD-10-CM | POA: Diagnosis not present

## 2016-11-21 DIAGNOSIS — R1032 Left lower quadrant pain: Secondary | ICD-10-CM | POA: Diagnosis present

## 2016-11-21 MED ORDER — SODIUM CHLORIDE 0.9 % IV SOLN: 500.0000 mL | INTRAVENOUS | Status: DC

## 2016-11-21 MED ORDER — DICYCLOMINE HCL 10 MG PO CAPS: ORAL_CAPSULE | ORAL | 11 refills | Status: DC

## 2016-11-21 NOTE — Patient Instructions (Signed)
   Information on polyps,diverticulosis,and hemorrhoids given to you today   Await pathology results on polyp removed   Bentyl 10 mg three times a day as needed for abdominal discomfort- this medication [facility] [address] for you to pick up     [place]:   Refer to the procedure report that was given to you for any specific questions about what was found during the examination.  If the procedure report does not answer your questions, please call your gastroenterologist to clarify.  If you requested that your care partner not be given the details of your procedure findings, then the procedure report has been included in a sealed envelope for you to review at your convenience later.  YOU SHOULD EXPECT: Some feelings of bloating in the abdomen. Passage of more gas than usual.  Walking can help get rid of the air that was put into your GI tract during the procedure and reduce the bloating. If you had a lower endoscopy (such as a colonoscopy or flexible sigmoidoscopy) you may notice spotting of blood in your stool or on the toilet paper. If you underwent a bowel prep for your procedure, you may not have a normal bowel movement for a few days.  Please Note:  You might notice some irritation and congestion in your nose or some drainage.  This is from the oxygen used during your procedure.  There is no need for concern and it should clear up in a day or so.  SYMPTOMS TO REPORT IMMEDIATELY:   Following lower endoscopy (colonoscopy or flexible sigmoidoscopy):  Excessive amounts of blood in the stool  Significant tenderness or worsening of abdominal pains  Swelling of the abdomen that is new, acute  Fever of 100F or higher    For urgent or emergent issues, a gastroenterologist can be reached at any hour by calling [phone].   DIET:  We do recommend a small meal at first, but then you may proceed to your regular  diet.  Drink plenty of fluids but you should avoid alcoholic beverages for 24 hours.  ACTIVITY:  You should plan to take it easy for the rest of today and you should NOT DRIVE or use heavy machinery until tomorrow (because of the sedation medicines used during the test).    FOLLOW UP: Our staff will call the number listed on your records the next business day following your procedure to check on you and address any questions or concerns that you may have regarding the information given to you following your procedure. If we do not reach you, we will leave a message.  However, if you are feeling well and you are not experiencing any problems, there is no need to return our call.  We will assume that you have returned to your regular daily activities without incident.  If any biopsies were taken you will be contacted by phone or by letter within the next 1-3 weeks.  Please call us at [phone] if you have not heard about the biopsies in 3 weeks.    SIGNATURES/CONFIDENTIALITY: You and/or your care partner have signed paperwork which will be entered into your electronic medical record.  These signatures attest to the fact that that the information above on your After Visit Summary has been reviewed and is understood.  Full responsibility of the confidentiality of this discharge information lies with you and/or your care-partner.

## 2016-11-21 NOTE — Progress Notes (Signed)
Called to room to assist during endoscopic procedure.  Patient ID and intended procedure confirmed with present staff. Received instructions for my participation in the procedure from the performing physician.  

## 2016-11-21 NOTE — Progress Notes (Signed)
Report to PACU, RN, vss, BBS= Clear.  

## 2016-11-21 NOTE — Op Note (Signed)
[place] Patient Name: Ryan [name] Procedure Date: 11/21/2016 9:15 AM MRN: [id number] Endoscopist: Mauri Pole , MD Age: 31 Referring MD:  Date of Birth: 07/30/1985 Gender: Male Account #: [id number] Procedure:                Colonoscopy Indications:              Abdominal pain in the left lower quadrant Medicines:                Monitored Anesthesia Care Procedure:                Pre-Anesthesia Assessment:                           - Prior to the procedure, a History and Physical                            was performed, and patient medications and                            allergies were reviewed. The patient's tolerance of                            previous anesthesia was also reviewed. The risks                            and benefits of the procedure and the sedation                            options and risks were discussed with the patient.                            All questions were answered, and informed consent                            was obtained. Prior Anticoagulants: The patient has                            taken no previous anticoagulant or antiplatelet                            agents. ASA Grade Assessment: II - A patient with                            mild systemic disease. After reviewing the risks                            and benefits, the patient was deemed in                            satisfactory condition to undergo the procedure.                           After obtaining informed consent, the colonoscope  was passed under direct vision. Throughout the                            procedure, the patient's blood pressure, pulse, and                            oxygen saturations were monitored continuously. The                            Colonoscope was introduced through the anus and                            advanced to the the terminal ileum, with                            identification of  the appendiceal orifice and IC                            valve. The colonoscopy was performed without                            difficulty. The patient tolerated the procedure                            well. The quality of the bowel preparation was                            excellent. The terminal ileum, ileocecal valve,                            appendiceal orifice, and rectum were photographed. Scope In: 9:21:09 AM Scope Out: 9:32:59 AM Scope Withdrawal Time: 0 hours 7 minutes 39 seconds  Total Procedure Duration: 0 hours 11 minutes 50 seconds  Findings:                 The perianal and digital rectal examinations were                            normal.                           The terminal ileum appeared normal.                           A 4 mm polyp was found in the rectum. The polyp was                            sessile. The polyp was removed with a cold snare.                            Resection and retrieval were complete.                           Multiple small-mouthed diverticula were found in  the sigmoid colon and descending colon. There was                            no evidence of diverticular bleeding.                           Non-bleeding internal hemorrhoids were found during                            retroflexion. The hemorrhoids were small.                           The exam was otherwise without abnormality. Complications:            No immediate complications. Estimated Blood Loss:     Estimated blood loss was minimal. Impression:               - The examined portion of the ileum was normal.                           - One 4 mm polyp in the rectum, removed with a cold                            snare. Resected and retrieved.                           - Mild diverticulosis in the sigmoid colon and in                            the descending colon. There was no evidence of                            diverticular bleeding.                            - Non-bleeding internal hemorrhoids.                           - The examination was otherwise normal. Recommendation:           - Patient has a contact number available for                            emergencies. The signs and symptoms of potential                            delayed complications were discussed with the                            patient. Return to normal activities tomorrow.                            Written discharge instructions were provided to the                            patient.                           -  Resume previous diet.                           - Continue present medications.                           - Await pathology results.                           - Repeat colonoscopy in 5-10 years for surveillance                            based on pathology results.                           - Bentyl 10mg  tab TID prn X 60 tabs with 11 refills Mauri Pole, MD 11/21/2016 9:38:55 AM This report has been signed electronically.

## 2016-11-22 ENCOUNTER — Telehealth: Payer: Self-pay | Admitting: Gastroenterology

## 2016-11-22 ENCOUNTER — Telehealth: Payer: Self-pay | Admitting: *Deleted

## 2016-11-22 NOTE — Telephone Encounter (Signed)
No answer for follow up call will attempt to call back later this afternoon. SM

## 2016-11-22 NOTE — Telephone Encounter (Signed)
Called back to the patient. He is not where he can talk to me. Offered suggestion he could try petroleum jelly  or a glycerin suppository. Also offered that his bowels may take 3 days or so to "get back to normal." He is encouraged to call back when he is where he can talk openly.

## 2016-11-22 NOTE — Telephone Encounter (Signed)
  Follow up Call-  Call back number 11/21/2016  Post procedure Call Back phone  # [phone]  Permission to leave phone message Yes  Some recent data might be hidden     Patient questions:  Do you have a fever, pain , or abdominal swelling? No. Pain Score  3 *  Have you tolerated food without any problems? Yes.    Have you been able to return to your normal activities? No.  Do you have any questions about your discharge instructions: Diet   Yes.   Medications  No. Follow up visit  No.  Do you have questions or concerns about your Care? Yes.  Patient states that he is still having the pain he came in with.  States burns with a BM. He did have a rectal polyp taken off. Denies a lot of bleeding.  Will call if worsens.  Actions: * If pain score is 4 or above: No action needed, pain <4.

## 2016-11-23 ENCOUNTER — Ambulatory Visit: Payer: BLUE CROSS/BLUE SHIELD | Admitting: Family Medicine

## 2016-11-23 ENCOUNTER — Telehealth: Payer: Self-pay | Admitting: Gastroenterology

## 2016-11-23 NOTE — Telephone Encounter (Signed)
Patient is feeling better. He wanted to know if we knew "what" the diagnosis was for his symptoms.  Told him I did not know. Asked if he had used the dicyclomine yet. He didn't answer that question directly. I then provided information on how the dicyclomine works and what symptoms he might use it for. He seemed satisfied with this information and thanked me for my call.

## 2016-11-29 ENCOUNTER — Encounter: Payer: Self-pay | Admitting: Gastroenterology

## 2016-12-08 ENCOUNTER — Ambulatory Visit: Payer: BLUE CROSS/BLUE SHIELD | Admitting: Family Medicine

## 2016-12-14 ENCOUNTER — Ambulatory Visit: Payer: BLUE CROSS/BLUE SHIELD | Admitting: Family Medicine

## 2016-12-16 ENCOUNTER — Encounter: Payer: Self-pay | Admitting: Family Medicine

## 2016-12-21 ENCOUNTER — Ambulatory Visit (INDEPENDENT_AMBULATORY_CARE_PROVIDER_SITE_OTHER): Payer: BLUE CROSS/BLUE SHIELD | Admitting: Family Medicine

## 2016-12-21 ENCOUNTER — Encounter: Payer: Self-pay | Admitting: Family Medicine

## 2016-12-21 VITALS — BP 102/64 | HR 62 | Temp 97.8°F | Ht 72.5 in | Wt 217.0 lb

## 2016-12-21 DIAGNOSIS — R1032 Left lower quadrant pain: Secondary | ICD-10-CM

## 2016-12-21 DIAGNOSIS — J301 Allergic rhinitis due to pollen: Secondary | ICD-10-CM | POA: Diagnosis not present

## 2016-12-21 DIAGNOSIS — I10 Essential (primary) hypertension: Secondary | ICD-10-CM | POA: Diagnosis not present

## 2016-12-21 DIAGNOSIS — M5442 Lumbago with sciatica, left side: Secondary | ICD-10-CM

## 2016-12-21 DIAGNOSIS — J45909 Unspecified asthma, uncomplicated: Secondary | ICD-10-CM

## 2016-12-21 DIAGNOSIS — Z86010 Personal history of colonic polyps: Secondary | ICD-10-CM | POA: Insufficient documentation

## 2016-12-21 DIAGNOSIS — Z23 Encounter for immunization: Secondary | ICD-10-CM | POA: Diagnosis not present

## 2016-12-21 DIAGNOSIS — G8929 Other chronic pain: Secondary | ICD-10-CM

## 2016-12-21 MED ORDER — MONTELUKAST SODIUM 10 MG PO TABS: 10.0000 mg | ORAL_TABLET | Freq: Every day | ORAL | 3 refills | Status: DC

## 2016-12-21 MED ORDER — LOSARTAN POTASSIUM 100 MG PO TABS: 100.0000 mg | ORAL_TABLET | Freq: Every day | ORAL | 3 refills | Status: DC

## 2016-12-21 MED ORDER — FLUTICASONE PROPIONATE 50 MCG/ACT NA SUSP: 1.0000 | Freq: Two times a day (BID) | NASAL | 11 refills | Status: DC

## 2016-12-21 MED ORDER — FLUTICASONE PROPIONATE HFA 44 MCG/ACT IN AERO: 2.0000 | INHALATION_SPRAY | Freq: Two times a day (BID) | RESPIRATORY_TRACT | 12 refills | Status: DC

## 2016-12-21 NOTE — Patient Instructions (Signed)
No changes today  Would call GI for follow up given continued abdominal pain with meals
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
## 2016-12-21 NOTE — Assessment & Plan Note (Signed)
S: Flovent BID compliant but doesn't like delivery method- prefers inhaler. Compliant with singulair. Albuterol once a month.  A/P: Change to [facility] flovent 44 mcg 2 puffs BID. Continue singulair. Well controlled with once a month albuterol.

## 2016-12-21 NOTE — Assessment & Plan Note (Signed)
S: Since teenage years. Epidurals every few years- murphy/wainer. Ibuprofen prn A/P: there was a mention in prior notes about possibly abdominal pain related to back pain. Since pain is only with meals suspect less likely.

## 2016-12-21 NOTE — Assessment & Plan Note (Signed)
S: controlled on losartan 100mg .  BP Readings from Last 3 Encounters:  12/21/16 102/64  11/21/16 106/66  11/03/16 (!) 140/92  A/P: We discussed blood pressure goal of <140/90. Continue current meds. Could even consider reduction to 50mg  if remains so well controlled.

## 2016-12-21 NOTE — Assessment & Plan Note (Signed)
S: reasonably controlled on  Flonase, singulair A/P: continue current rx

## 2016-12-21 NOTE — Assessment & Plan Note (Addendum)
Lower abdominal pain when eats ever since the kidney stones 3 months ago- so waits to eat 1 meal a day. Yellow stools for months. Eats large meal when he does eat. CT without contrast 08/15/16. Advised GI follow up as unclear cause and continued symptoms  Omeprazole helps minimally.

## 2016-12-29 ENCOUNTER — Encounter: Payer: BLUE CROSS/BLUE SHIELD | Admitting: Gastroenterology

## 2017-01-10 ENCOUNTER — Telehealth: Payer: Self-pay | Admitting: Family Medicine

## 2017-01-10 NOTE — Telephone Encounter (Signed)
Patient called complaining of stomach pains. I transferred the call to [facility] to advise. Awaiting triage note.

## 2017-01-10 NOTE — Telephone Encounter (Signed)
See other note on patient.

## 2017-01-10 NOTE — Telephone Encounter (Incomplete)
Name: Dujuan MARCHELLETT A DOB: 12/10/1985 Initial Comment caller states he has upper right abdominal pain { dr.hunter } Nurse Assessment Nurse: Tawanna Solo, RN, Vaughan Basta Date/Time (Eastern Time): 01/10/2017 4:02:31 PM Confirm and document reason for call. If symptomatic, describe symptoms. ---Caller says he is having right upper abd pain and nausea. Rates pain 4 of 10. Does the patient have any new or worsening symptoms? ---Yes Will a triage be completed? ---Yes Related visit to physician within the last 2 weeks? ---No Does the PT have any chronic conditions? (i.e. diabetes, asthma, etc.) ---Yes List chronic conditions. ---HTN Asthma Kidney stones Is this a behavioral health or substance abuse call? ---No Guidelines Guideline Title Affirmed Question Affirmed Notes Abdominal Pain - Upper [1] MILD-MODERATE pain AND [2] constant AND [3] present > 2 hours Final Disposition User See Physician within 4 Hours (or PCP triage) Tawanna Solo, RN, Linda Referrals GO TO FACILITY UNDECIDED Caller Disagree/Comply Comply Caller Understands Yes PreDisposition InappropriateToAsk

## 2017-01-11 ENCOUNTER — Telehealth: Payer: Self-pay | Admitting: Gastroenterology

## 2017-01-11 NOTE — Telephone Encounter (Signed)
Chronic abdominal pain, he had 2 CT abdomen and pelvis in the past 6 months, most recent July 2018. Both CT scan showed bilateral nephrolithiasis. No specific GI pathology.

## 2017-01-11 NOTE — Telephone Encounter (Signed)
Ryan [name] -- Can you see how the patient is feeling this morning and if they need to be scheduled?

## 2017-01-11 NOTE — Telephone Encounter (Signed)
Patient went to the urologist due to his left sided abdominal pain. He was told it was not due to the renal stones. He complains of abdominal pain on the left side and extending downward. Describes this as "coming waves." Normal bowel movements. No vomiting, but feels nauseous at times. Avoiding spicy and greasy foods. Afebrile. There is nothing he does that makes the pain better or worse. He states Dicyclomine does not help. He has already scheduled an appointment with his PCP for tomorrow. He will contact us if PCP feels this is GI issue.

## 2017-01-12 ENCOUNTER — Encounter: Payer: Self-pay | Admitting: Family Medicine

## 2017-01-12 ENCOUNTER — Ambulatory Visit (INDEPENDENT_AMBULATORY_CARE_PROVIDER_SITE_OTHER): Payer: BLUE CROSS/BLUE SHIELD | Admitting: Family Medicine

## 2017-01-12 VITALS — BP 120/70 | HR 77 | Temp 97.5°F | Wt 214.6 lb

## 2017-01-12 DIAGNOSIS — R1032 Left lower quadrant pain: Secondary | ICD-10-CM

## 2017-01-12 MED ORDER — KETOROLAC TROMETHAMINE 60 MG/2ML IM SOLN
60.0000 mg | Freq: Once | INTRAMUSCULAR | Status: AC
  Administered 2017-01-12: 60 mg via INTRAMUSCULAR

## 2017-01-12 NOTE — Progress Notes (Signed)
Subjective:  Ryan [name] is a 31 y.o. year old very pleasant male patient who presents for/with See problem oriented charting ROS- no fever, chills, nausea, vomiting   Past Medical History-  Patient Active Problem List   Diagnosis Date Noted  . History of adenomatous polyp of colon 12/21/2016    Priority: Medium  . LLQ abdominal pain 11/03/2016    Priority: Medium  . Chronic low back pain 04/06/2015    Priority: Medium  . Essential hypertension 09/19/2006    Priority: Medium  . Asthma 09/19/2006    Priority: Medium  . Recurrent kidney stones 04/02/2014    Priority: Low  . PANIC ATTACK, ACUTE 08/20/2009    Priority: Low  . Allergic rhinitis 09/19/2006    Priority: Low    Medications- reviewed and updated Current Outpatient Prescriptions  Medication Sig Dispense Refill  . fluticasone (FLONASE) 50 MCG/ACT nasal spray Place 1 spray into both nostrils 2 (two) times daily. 32 g 11  . fluticasone (FLOVENT HFA) 44 MCG/ACT inhaler Inhale 2 puffs into the lungs 2 (two) times daily. 1 Inhaler 12  . ibuprofen (ADVIL,MOTRIN) 200 MG tablet Take 800 mg by mouth 2 (two) times daily as needed for moderate pain.    Marland Kitchen losartan (COZAAR) 100 MG tablet Take 1 tablet (100 mg total) by mouth daily. 100 tablet 3  . montelukast (SINGULAIR) 10 MG tablet Take 1 tablet (10 mg total) by mouth at bedtime. 100 tablet 3  . ofloxacin (OCUFLOX) 0.3 % ophthalmic solution   0   No current facility-administered medications for this visit.     Objective: BP 120/70   Pulse 77   Temp (!) 97.5 F (36.4 C) (Oral)   Wt 214 lb 9.6 oz (97.3 kg)   SpO2 94%   BMI 28.70 kg/m  Gen: NAD, resting comfortably, appears in discomfort CV: RRR no murmurs rubs or gallops Lungs: CTAB no crackles, wheeze, rhonchi Abdomen: soft/reports tenderness in LLQ when palpating RLQ, moderate to severe pain when palpating LLQ/nondistended/normal bowel sounds. No rebound or guarding.  Ext: no edema Skin: warm, dry, no rash  over abdomen  Assessment/Plan:  Abdominal pain after meals- slightly improved over last few months LLQ pain S:  Patient was seen 10/04/16 by Dr. Elease Hashimoto with bilateral lower abdominal pain. Prior Ct abdomen/pelvis noncontrast without obvious cause of pain other than kidney stones. Also had MRI lumbar spine 10/12/16 which showed  "Abnormal spinal alignment which is probably congenital/ developmental. Some degenerative facet changes at L4-5 and L5-S1 which could contribute to low back pain. No evidence of disc pathology or neural compression."  I saw him 12/21/16- he reported lower abdominal pain when he eats since his kidney stone so moved to 1 meal a day. Stools also with change to yellow. At that point I advised Gi follow up. I did not think that low back pain was the source of pain considering mainly with meals.   Has seen GI and had reassuring colonoscopy. Was given bentyl to try- this does not help.  Prior pain when he was eating has  improved- changed eating habits and seemed to get better. Down from 5-6 and up to 3-4/10.   He also saw urology who did not think kidney stones was cause of pain. Had x-ray yesterday- no stone as obvious cause- has some still in place that has only moved in the kidney.   Pain in LLQ starting Tuesday- started in back but transferred most to LLQ. Worse with placing belt on. Feels very  similar to prior stone. Had some myalgias as well. As well as fatigue. LIngering dull pain then gets really sharp severe pain intermittently. No RUQ pain A/P: LLQ pain- he has had 2 scans within last 4 months but both noncontrast. He requests CT scan abd/pelvis with contrast and this will be set up. Will get  Today-cbc, cmp.   I shard with patient I was concerned about his behavior in receiving narcotics. Over the last 2 years, he has had 18 providers of nacotics,with 12 in last few months. This is in a patient with ETOH abuse history. Initially he told me he had hydrocodone sitting  on his shelf and had 5-6 left maybe 2-3. We called [facility] and confirmed filled [facility]15 yesterday which seemed inconsistent with his story. I did not refill any hydrocodone today as he should have quantity sufficient until scan. If no obvious cause of pain on CT, will need to have more frank discussion with patient about concerns of narcotic seeking behavior in patient with ETOH abuse-   Future Appointments Date Time Provider Bent  03/13/2017 8:45 AM Mauri Pole, MD [facility]   Meds ordered this encounter  Medications  . ofloxacin (OCUFLOX) 0.3 % ophthalmic solution    Refill:  0   The duration of face-to-face time during this visit was greater than 25 minutes. Greater than 50% of this time was spent in counseling, explanation of diagnosis, planning of further management, and/or coordination of care including discussion of atypical patterns of requesting narcotics and my desire to consolidate this under my care, discussion of difficulty of dealing with long term pain.    Return precautions advised.  Garret Reddish, MD

## 2017-01-12 NOTE — Patient Instructions (Addendum)
Great pain shot for kidney stones given today  Bloodwork before you go  Trying to get CT scan set up for tomorrow

## 2017-01-12 NOTE — Telephone Encounter (Signed)
Patient scheduled for today 01/12/17

## 2017-01-13 ENCOUNTER — Telehealth: Payer: Self-pay

## 2017-01-13 ENCOUNTER — Ambulatory Visit (INDEPENDENT_AMBULATORY_CARE_PROVIDER_SITE_OTHER)
Admission: RE | Admit: 2017-01-13 | Discharge: 2017-01-13 | Disposition: A | Discharge status: Home / Self Care | Payer: BLUE CROSS/BLUE SHIELD | Source: Ambulatory Visit | Attending: Family Medicine | Admitting: Family Medicine

## 2017-01-13 DIAGNOSIS — R1032 Left lower quadrant pain: Secondary | ICD-10-CM | POA: Diagnosis not present

## 2017-01-13 LAB — COMPREHENSIVE METABOLIC PANEL
ALT: 33 U/L (ref 0–53)
AST: 24 U/L (ref 0–37)
Albumin: 4.7 g/dL (ref 3.5–5.2)
Alkaline Phosphatase: 81 U/L (ref 39–117)
BUN: 9 mg/dL (ref 6–23)
CO2: 22 mEq/L (ref 19–32)
Calcium: 9.9 mg/dL (ref 8.4–10.5)
Chloride: 103 mEq/L (ref 96–112)
Creatinine, Ser: 0.79 mg/dL (ref 0.40–1.50)
GFR: 119.75 mL/min (ref >60.00)
Glucose, Bld: 84 mg/dL (ref 70–99)
Potassium: 3.7 mEq/L (ref 3.5–5.1)
Sodium: 137 mEq/L (ref 135–145)
Total Bilirubin: 0.5 mg/dL (ref 0.2–1.2)
Total Protein: 7.5 g/dL (ref 6.0–8.3)

## 2017-01-13 LAB — CBC WITH DIFFERENTIAL/PLATELET
Basophils Absolute: 0.1 10*3/uL (ref 0.0–0.1)
Basophils Relative: 1 % (ref 0.0–3.0)
Eosinophils Absolute: 0.3 10*3/uL (ref 0.0–0.7)
Eosinophils Relative: 3.4 % (ref 0.0–5.0)
HCT: 46.7 % (ref 39.0–52.0)
Hemoglobin: 16 g/dL (ref 13.0–17.0)
Lymphocytes Relative: 42.3 % (ref 12.0–46.0)
Lymphs Abs: 4 10*3/uL (ref 0.7–4.0)
MCHC: 34.2 g/dL (ref 30.0–36.0)
MCV: 93.7 fl (ref 78.0–100.0)
Monocytes Absolute: 0.7 10*3/uL (ref 0.1–1.0)
Monocytes Relative: 6.9 % (ref 3.0–12.0)
Neutro Abs: 4.4 10*3/uL (ref 1.4–7.7)
Neutrophils Relative %: 46.4 % (ref 43.0–77.0)
Platelets: 314 10*3/uL (ref 150.0–400.0)
RBC: 4.99 Mil/uL (ref 4.22–5.81)
RDW: 12.3 % (ref 11.5–15.5)
WBC: 9.5 10*3/uL (ref 4.0–10.5)

## 2017-01-13 MED ORDER — IOPAMIDOL (ISOVUE-300) INJECTION 61%
100.0000 mL | Freq: Once | INTRAVENOUS | Status: AC | PRN
  Administered 2017-01-13: 100 mL via INTRAVENOUS

## 2017-01-13 NOTE — Telephone Encounter (Signed)
Message saved.

## 2017-01-13 NOTE — Telephone Encounter (Signed)
-----   Message from Loralie Champagne, PA-C sent at 01/13/2017  4:14 PM EDT ----- Are you able to save this message to the chart so that anyone who sees him here in the future can be aware?    Thank you,  Jess   ----- Message ----- From: Marin Olp, MD Sent: 01/12/2017   4:94 PM To: Delora Fuel, MD, Kathie Rhodes, MD, #  Hello all,   I just wanted to thank each of you for caring for Ryan [name] in different settings. I became his PCP on 12/21/16 and had my second visit with him today.   I just wanted you to be aware if he seeks care with you again (seems to seek care out with multiple providers) that I am concerned about his narcotic use/requesting habits.   Within the last 6 months he has requested narcotics it appears from at least 12 different providers (usually very small quantities). He has had legitimate causes of pain such as kidney stones and back pain but his pattern concerns me. He has a history of ETOH abuse in the past as well. I asked him to try to consolidate requests to me.   Thanks for your consideration,  Garret Reddish

## 2017-01-14 ENCOUNTER — Encounter: Payer: Self-pay | Admitting: Family Medicine

## 2017-01-15 ENCOUNTER — Encounter: Payer: Self-pay | Admitting: Family Medicine

## 2017-02-07 ENCOUNTER — Telehealth: Payer: Self-pay | Admitting: Family Medicine

## 2017-02-07 NOTE — Telephone Encounter (Signed)
Has he discussed with pharmacy if his lot is recalled?

## 2017-02-07 NOTE — Telephone Encounter (Signed)
Losartan recalled. Should he take or not?  Ty,  -LL

## 2017-02-08 NOTE — Telephone Encounter (Signed)
Called and spoke to patient. I advised him to call his pharmacy and confirm that the brand he is taking is the one that has been recalled

## 2017-02-15 ENCOUNTER — Encounter: Payer: Self-pay | Admitting: Family Medicine

## 2017-02-15 ENCOUNTER — Telehealth: Payer: Self-pay | Admitting: Family Medicine

## 2017-02-15 ENCOUNTER — Ambulatory Visit: Payer: BLUE CROSS/BLUE SHIELD | Admitting: Family Medicine

## 2017-02-15 VITALS — BP 128/70 | HR 67 | Temp 97.8°F | Ht 72.5 in | Wt 215.2 lb

## 2017-02-15 DIAGNOSIS — M67431 Ganglion, right wrist: Secondary | ICD-10-CM | POA: Diagnosis not present

## 2017-02-15 MED ORDER — METHYLPREDNISOLONE ACETATE 80 MG/ML IJ SUSP
40.0000 mg | Freq: Once | INTRAMUSCULAR | Status: AC
  Administered 2017-02-15: 40 mg via INTRA_ARTICULAR

## 2017-02-15 MED ORDER — METHYLPREDNISOLONE ACETATE 80 MG/ML IJ SUSP: 40.0000 mg | Freq: Once | INTRAMUSCULAR | Status: DC

## 2017-02-15 NOTE — Telephone Encounter (Signed)
Patient called in reference to having pain in inside of RT wrist and in thumb and pointer finger. Patient would like to know if this is normal after steroid injection in wrist. Please call patient and advise. OK to leave message.

## 2017-02-15 NOTE — Patient Instructions (Signed)
We injected your ganglion cyst with 1/2 cc of depomedrol 80mg /cc and 1/2 cc of lidocaine.   Would continue to ice 3x a day- after about 3-4 hours the pain may worsen. Usually pain improves within 24-48 hours with steroids.   If you have worsening pain after 48 hours or if you have expanding redness around the site- seek care - we do have some Friday office hours over at [facility] (doubt you will need this)  Once again- main risks are bleeding or infection. Also there is a risk this may not be beneficial- would refer you to hand surgery if you do not have relief with this- would see their thoughts on repeat aspiration/injection vs. Surgery if still really painful

## 2017-02-15 NOTE — Addendum Note (Signed)
Addended by: Lyndle Herrlich on: 02/15/2017 09:32 AM   Modules accepted: Orders

## 2017-02-15 NOTE — Progress Notes (Signed)
Subjective:  Ryan [name] is a 31 y.o. year old very pleasant male patient who presents for/with See problem oriented charting ROS- no fever, chills. Does have wrist pain- gets worse as day progresses and uses wrist. No numbness or tingling in fingers.    Past Medical History-  Patient Active Problem List   Diagnosis Date Noted  . History of adenomatous polyp of colon 12/21/2016    Priority: Medium  . LLQ abdominal pain 11/03/2016    Priority: Medium  . Chronic low back pain 04/06/2015    Priority: Medium  . Essential hypertension 09/19/2006    Priority: Medium  . Asthma 09/19/2006    Priority: Medium  . Recurrent kidney stones 04/02/2014    Priority: Low  . PANIC ATTACK, ACUTE 08/20/2009    Priority: Low  . Allergic rhinitis 09/19/2006    Priority: Low    Medications- reviewed and updated Current Outpatient Medications  Medication Sig Dispense Refill  . fluticasone (FLONASE) 50 MCG/ACT nasal spray Place 1 spray into both nostrils 2 (two) times daily. 32 g 11  . fluticasone (FLOVENT HFA) 44 MCG/ACT inhaler Inhale 2 puffs into the lungs 2 (two) times daily. 1 Inhaler 12  . ibuprofen (ADVIL,MOTRIN) 200 MG tablet Take 800 mg by mouth 2 (two) times daily as needed for moderate pain.    Marland Kitchen losartan (COZAAR) 100 MG tablet Take 1 tablet (100 mg total) by mouth daily. 100 tablet 3  . montelukast (SINGULAIR) 10 MG tablet Take 1 tablet (10 mg total) by mouth at bedtime. 100 tablet 3  . prednisoLONE acetate (PRED FORTE) 1 % ophthalmic suspension Place 1 drop into the left eye 3 (three) times daily.     Objective: BP 128/70 (BP Location: Left Arm, Patient Position: Sitting, Cuff Size: Large)   Pulse 67   Temp 97.8 F (36.6 C) (Oral)   Ht 6' 0.5" (1.842 m)   Wt 215 lb 3.2 oz (97.6 kg)   SpO2 97%   BMI 28.79 kg/m  Gen: NAD, resting comfortably CV: RRR  Lungs: nonlabored Ext: no edema Skin: warm, dry Psych: slightly anxious appearing particularly around time of  procedure  Ganglion cyst dorsum right wrist near radial side  Injection cyst Verbal Consent obtained and verified. Cleansed with alcohol. Topical analgesic spray: Ethyl chloride. Ganglion cyst or dorsum of right wrist Completed without difficulty Meds:  1/2 cc of depomedrol 80mg /cc and 1/2 cc of lidocaine.  Needle: 25 gauge  Aftercare instructions and Red flags advised.  Assessment/Plan:  Ganglion cyst of dorsum of right wrist S: right wrist cyst- started bothering him 3 weeks ago- could have been there months before hand. Has small nonpainful similar lesion on left hand. Pain up to 6/10. Can get down to 4-5/10 with wrapping in ice and does this regularly- also sleeps with ice pack on. Ibuprofen helps some.  A/P: Patient has tried conservative care and not making progress. He has a lot of tasks coming up over next 2 weeks- discussed sports medicine or ortho refer or injection here. He opts for injection here- provided today with aftercare discussed. Refer to hand surgery if fails to improve witht his  Future Appointments  Date Time Provider [place]  02/15/2017  8:45 AM Marin Olp, MD [facility] None  03/13/2017  8:45 AM Nandigam, Venia Minks, MD [facility]    Meds ordered this encounter  Medications  . prednisoLONE acetate (PRED FORTE) 1 % ophthalmic suspension    Sig: Place 1 drop into the left eye  3 (three) times daily.    Return precautions advised.  Garret Reddish, MD

## 2017-02-15 NOTE — Telephone Encounter (Signed)
This was addressed by Dr. Yong Channel via My Chart message

## 2017-02-16 ENCOUNTER — Other Ambulatory Visit: Payer: Self-pay | Admitting: Family Medicine

## 2017-02-17 ENCOUNTER — Encounter: Payer: Self-pay | Admitting: Family Medicine

## 2017-02-17 ENCOUNTER — Ambulatory Visit: Payer: BLUE CROSS/BLUE SHIELD | Admitting: Family Medicine

## 2017-02-17 VITALS — BP 140/90 | HR 66 | Temp 97.8°F | Ht 72.05 in | Wt 214.5 lb

## 2017-02-17 DIAGNOSIS — L03119 Cellulitis of unspecified part of limb: Secondary | ICD-10-CM | POA: Diagnosis not present

## 2017-02-17 MED ORDER — TRAMADOL HCL 50 MG PO TABS: 50.0000 mg | ORAL_TABLET | Freq: Three times a day (TID) | ORAL | 0 refills | Status: DC | PRN

## 2017-02-17 MED ORDER — DOXYCYCLINE HYCLATE 100 MG PO TABS
100.0000 mg | ORAL_TABLET | Freq: Two times a day (BID) | ORAL | 0 refills | Status: DC
Start: 2017-02-17 — End: 2017-03-17

## 2017-02-17 NOTE — Patient Instructions (Addendum)
Follow up as needed or as scheduled Start the Doxycyline twice daily x7 days- take w/ food Continue to alternate ice and heat- whichever feels better Immobilize the wrist for comfort Continue the ibuprofen for pain and swelling- use the Tramadol as needed for severe pain (may cause drowsiness) I sent a message to Dr Yong Channel so he can refer you to a hand specialist Call with any questions or concerns- particularly if the redness or pain is worsening Hang in there!!

## 2017-02-17 NOTE — Progress Notes (Signed)
   Subjective:    Patient ID: Ryan [name], male    DOB: 06/14/1985, 31 y.o.   MRN: [id number]  HPI Wrist pain- saw Dr Yong Channel on 11/21 and got steroid injxn in R ganglion cyst.  Since injxn, area is red, more painful, having pain extending into thumb and index finger.  Pain w/ wrist movement.  Taking ibuprofen w/o relief.  Leaving for business trip on Sunday and will be gone all week.   Review of Systems For ROS see HPI     Objective:   Physical Exam  Constitutional: He is oriented to person, place, and time. He appears well-developed and well-nourished. No distress.  Cardiovascular: Intact distal pulses.  Musculoskeletal: He exhibits tenderness (exquisite TTP over R dorsal ganglion cyst) and deformity (Ganglion cyst on dorsum of R wrist w/ overlying redness and extreme TTP).  Full ROM of R wrist but very painful  Neurological: He is alert and oriented to person, place, and time.  Psychiatric: His behavior is normal. Thought content normal.  Very anxious  Vitals reviewed.         Assessment & Plan:  R wrist cellulitis- injection site clearly seen on dorsum of R wrist over ganglion.  Pt now w/ exquisite TTP and erythema overlying injxn site and ganglion.  Also having pain in thumb and 1st finger which is likely nerve irritation from either the ganglion itself or the recent injxn.  Given pt's allergies will start Doxy.  Tramadol prn pain.  Will forward a copy of today's note to PCP as he will need referral to a hand specialist when he returns from his trip on Friday.  Reviewed supportive care and red flags that should prompt return.  Pt expressed understanding and is in agreement w/ plan.

## 2017-02-20 ENCOUNTER — Other Ambulatory Visit: Payer: Self-pay

## 2017-02-20 DIAGNOSIS — M6740 Ganglion, unspecified site: Secondary | ICD-10-CM

## 2017-02-21 ENCOUNTER — Telehealth: Payer: Self-pay

## 2017-02-21 ENCOUNTER — Other Ambulatory Visit: Payer: Self-pay

## 2017-02-21 DIAGNOSIS — M6740 Ganglion, unspecified site: Secondary | ICD-10-CM

## 2017-02-21 NOTE — Telephone Encounter (Signed)
Copied from [place] [id number]. Topic: Referral - Question >> Feb 21, 2017  2:00 PM Hewitt Shorts wrote: Reason for CRM: robin with the hand center is calling to see why Dr. Yong Channel is labeling this as an urgent referral   Best number to robin is [phone]  >> Feb 21, 2017  4:22 PM Boyd Kerbs wrote: Referral has down Urgent but it says [facility]. Is this urgent (something else going on to make it urgent. Please call Shirlean Mylar tomorrow after 8:30 Shirlean Mylar [phone]

## 2017-02-22 ENCOUNTER — Telehealth: Payer: Self-pay

## 2017-02-22 NOTE — Telephone Encounter (Signed)
Spoke with Shirlean Mylar who had called the patient and he is scheduled for Monday since he is out of town. She was questioning what was urgent about the referral so we discussed the Cellulitis diagnosis.

## 2017-02-22 NOTE — Telephone Encounter (Signed)
Copied from [place] [id number]. Topic: Referral - Question >> Feb 21, 2017  2:00 PM Hewitt Shorts wrote: Reason for CRM: robin with the hand center is calling to see why Dr. Yong Channel is labeling this as an urgent referral   Best number to robin is [phone]  >> Feb 21, 2017  4:22 PM Boyd Kerbs wrote: Referral has down Urgent but it says [facility]. Is this urgent (something else going on to make it urgent. Please call Shirlean Mylar tomorrow after 8:30 Shirlean Mylar [phone] >> Feb 22, 2017  9:29 AM Ivar Drape wrote: Shirlean Mylar called again and said she was going to call the patient and find out the answer to her question.

## 2017-02-23 ENCOUNTER — Other Ambulatory Visit: Payer: Self-pay

## 2017-02-23 MED ORDER — ALBUTEROL SULFATE HFA 108 (90 BASE) MCG/ACT IN AERS: 2.0000 | INHALATION_SPRAY | Freq: Four times a day (QID) | RESPIRATORY_TRACT | 1 refills | Status: DC | PRN

## 2017-03-13 ENCOUNTER — Ambulatory Visit: Payer: BLUE CROSS/BLUE SHIELD | Admitting: Gastroenterology

## 2017-03-17 ENCOUNTER — Encounter: Payer: Self-pay | Admitting: Family Medicine

## 2017-03-17 ENCOUNTER — Ambulatory Visit: Payer: BLUE CROSS/BLUE SHIELD | Admitting: Family Medicine

## 2017-03-17 VITALS — BP 142/84 | HR 86 | Temp 97.7°F | Ht 72.0 in | Wt 217.0 lb

## 2017-03-17 DIAGNOSIS — M5442 Lumbago with sciatica, left side: Secondary | ICD-10-CM

## 2017-03-17 MED ORDER — KETOROLAC TROMETHAMINE 60 MG/2ML IM SOLN
60.0000 mg | Freq: Once | INTRAMUSCULAR | Status: AC
  Administered 2017-03-17: 60 mg via INTRAMUSCULAR

## 2017-03-17 MED ORDER — DICLOFENAC SODIUM 75 MG PO TBEC: 75.0000 mg | DELAYED_RELEASE_TABLET | Freq: Two times a day (BID) | ORAL | 0 refills | Status: DC

## 2017-03-17 MED ORDER — CYCLOBENZAPRINE HCL 10 MG PO TABS: 10.0000 mg | ORAL_TABLET | Freq: Three times a day (TID) | ORAL | 0 refills | Status: DC | PRN

## 2017-03-17 NOTE — Patient Instructions (Signed)
Start the diclofenac and flexeril tomorrow.  Let us know if worsening or not improving.  Take care, Dr Jerline Pain

## 2017-03-17 NOTE — Progress Notes (Signed)
    Subjective:  Ryan [name] is a 30 y.o. male who presents today with a chief complaint of back pain.   HPI:  Back Pain, acute issue Started 6 days ago.  Patient was doing yard work and thinks he may have aggravated it then.  No obvious precipitating events.  No trauma.  Pain currently located on his left lower back.  Will occasionally radiate into his left flank into his left leg.  Has tried heat, tramadol, ibuprofen, and Tylenol which modestly help.  He has a history of back pain and kidney stones however this does not feel similar to his past kidney stones.  No hematuria.  No bowel or bladder incontinence.  No urinary retention.  No weakness or numbness.  No fevers or chills.  Pain is worse with movement.  ROS: Per HPI  PMH: Significant for history of recurrent kidney stones.  Objective:  Physical Exam: BP (!) 142/84 (BP Location: Left Arm, Patient Position: Sitting, Cuff Size: Normal)   Pulse 86   Temp 97.7 F (36.5 C) (Oral)   Ht 6' (1.829 m)   Wt 217 lb (98.4 kg)   SpO2 97%   BMI 29.43 kg/m   Gen: NAD, resting comfortably MSK:  -Back: No deformities.  Exquisitely tender to palpation along left paraspinal muscles.  His spinous processes along his T and L-spine are nontender to percussion with reflex hammer.  Range of motion somewhat limited due to pain. -Lower extremities: No deformities.  Full range of motion.  Strength 5 out of 5 in all fields.  Sensation to light touch grossly intact throughout.  Patellar and Achilles reflexes 2+ and symmetric bilaterally.  Straight leg raise negative on the right.  Straight leg raise positive on the left.  Assessment/Plan:  Back pain No red flag signs or symptoms.  Seems to be most likely muscular in etiology-he has a few trigger points, however defer trigger point injection today.  No true flank pain or urinary symptoms to suggest nephrolithiasis.  Straight leg raise is mildly positive on the left-he may have some component of  sciatica as well.  We will start conservative therapy with NSAIDs and muscle relaxer.  Gave patient 60 mg of Toradol IM today.  Gave a prescription for diclofenac to start tomorrow for 2-week course.  Also gave patient prescription for Flexeril 10 mg 3 times daily as needed.  Return precautions reviewed.  Follow-up as needed.  States he will be following up with his orthopedist within the next few weeks.  Algis Greenhouse. Jerline Pain, MD 03/17/2017 2:39 PM

## 2017-03-19 ENCOUNTER — Encounter: Payer: Self-pay | Admitting: Family Medicine

## 2017-03-20 ENCOUNTER — Ambulatory Visit: Payer: BLUE CROSS/BLUE SHIELD | Admitting: Family Medicine

## 2017-03-20 ENCOUNTER — Encounter: Payer: Self-pay | Admitting: Family Medicine

## 2017-03-20 VITALS — BP 134/76 | HR 75 | Temp 97.5°F | Ht 72.0 in | Wt 217.0 lb

## 2017-03-20 DIAGNOSIS — M549 Dorsalgia, unspecified: Secondary | ICD-10-CM

## 2017-03-20 MED ORDER — METHYLPREDNISOLONE ACETATE 80 MG/ML IJ SUSP
80.0000 mg | Freq: Once | INTRAMUSCULAR | Status: AC
  Administered 2017-03-20: 80 mg via INTRAMUSCULAR

## 2017-03-20 NOTE — Progress Notes (Signed)
    Subjective:  Ryan [name] is a 31 y.o. male who presents today with a chief complaint of back pain.   HPI:  Back Pain, Established Problem, worsening Patient seen 3 days ago for this.  He was given a dose of IM Toradol which helped with his symptoms.  He is also prescribed Flexeril and diclofenac.  Over the last couple days his symptoms are worsened.  Pain now radiates into his left upper shoulder.  Thinks that the Flexeril and diclofenac help a little bit.  Noticed that his symptoms worsened after sitting in a car for about 30 minutes.  No other obvious alleviating or aggravating factors.  No weakness or numbness.  No bowel or bladder incontinence.  No urinary retention.  ROS: Per HPI  Objective:  Physical Exam: BP 134/76 (BP Location: Left Arm, Patient Position: Sitting, Cuff Size: Normal)   Pulse 75   Temp (!) 97.5 F (36.4 C) (Oral)   Ht 6' (1.829 m)   Wt 217 lb (98.4 kg)   SpO2 97%   BMI 29.43 kg/m   Gen: NAD, resting comfortably MSK: -Back: No deformities.  Exquisitely tender to palpation along left lumbar paraspinal muscles. -Lower extremities: Strength 5 out of 5 throughout.  Trigger Point Injection Procedure Note:  Pre-operative diagnosis: Lumbar back pain  Post-operative diagnosis: Lumbar back pain  After risks and benefits were explained including bleeding, infection, worsening of the pain, damage to the area being injected, weakness, allergic reaction to medications, vascular injection, and nerve damage, signed consent was obtained.  All questions were answered.    2 trigger points were identified along his left lumbar paraspinal muscle group.  The area of the trigger points were identified and the skin prepped three times with alcohol and the alcohol allowed to dry.  The trigger points were then each subsequently injected with 1.5 cc of 1% lidocaine.   The patient did tolerate the procedure well and there were no complications.    Trigger points  injected: 2    Trigger point(s) location(s):  left lumbar back   Assessment/Plan:  Back pain No red flag signs or symptoms, though the pain is worsened since his last visit despite diclofenac and Flexeril treatment.  Gave 80 mg IM Depo-Medrol today.  Advised him to continue with his Flexeril as needed.  As noted above, we also proceeded with trigger point injection today.  He will be following up with his orthopedist in about 10 days.  Return precautions reviewed.  Algis Greenhouse. Jerline Pain, MD 03/20/2017 12:20 PM

## 2017-03-30 ENCOUNTER — Other Ambulatory Visit: Payer: Self-pay | Admitting: Family Medicine

## 2017-03-31 NOTE — Telephone Encounter (Signed)
Pt seen 03/17/17 and given Ketorolac injection, prescribed Cyclobenzaprine and Diclofenac. Also seen 03/20/17 and given depo medrol inj and advised to f/u with ortho in 10 days. OK to refill Diclofenac?

## 2017-03-31 NOTE — Telephone Encounter (Signed)
Ok to refill. Needs to be seen for further refills. Should follow up with ortho going forward.

## 2017-04-05 ENCOUNTER — Encounter: Payer: Self-pay | Admitting: Family Medicine

## 2017-04-05 MED ORDER — PREDNISONE 20 MG PO TABS
ORAL_TABLET | ORAL | 0 refills | Status: DC
Start: 2017-04-05 — End: 2017-04-14

## 2017-04-06 ENCOUNTER — Ambulatory Visit: Payer: BLUE CROSS/BLUE SHIELD | Admitting: Family Medicine

## 2017-04-11 ENCOUNTER — Telehealth: Payer: Self-pay | Admitting: Family Medicine

## 2017-04-11 NOTE — Telephone Encounter (Signed)
Patient has been discussing this with Dr Yong Channel over [place].

## 2017-04-11 NOTE — Telephone Encounter (Signed)
Please contact patient immediately to discuss appointment made via mychart for 1/18 for "coughing up blood in the mornings. 1-3 tablespoons a morning worth."

## 2017-04-14 ENCOUNTER — Ambulatory Visit: Payer: Self-pay | Admitting: Family Medicine

## 2017-04-14 ENCOUNTER — Ambulatory Visit (INDEPENDENT_AMBULATORY_CARE_PROVIDER_SITE_OTHER): Payer: BLUE CROSS/BLUE SHIELD

## 2017-04-14 ENCOUNTER — Encounter: Payer: Self-pay | Admitting: Family Medicine

## 2017-04-14 ENCOUNTER — Ambulatory Visit: Payer: BLUE CROSS/BLUE SHIELD | Admitting: Family Medicine

## 2017-04-14 VITALS — BP 124/86 | HR 86 | Temp 98.4°F | Ht 72.0 in | Wt 208.2 lb

## 2017-04-14 DIAGNOSIS — R042 Hemoptysis: Secondary | ICD-10-CM

## 2017-04-14 DIAGNOSIS — B9689 Other specified bacterial agents as the cause of diseases classified elsewhere: Secondary | ICD-10-CM | POA: Diagnosis not present

## 2017-04-14 DIAGNOSIS — J329 Chronic sinusitis, unspecified: Secondary | ICD-10-CM

## 2017-04-14 DIAGNOSIS — J40 Bronchitis, not specified as acute or chronic: Secondary | ICD-10-CM

## 2017-04-14 DIAGNOSIS — J4541 Moderate persistent asthma with (acute) exacerbation: Secondary | ICD-10-CM | POA: Diagnosis not present

## 2017-04-14 MED ORDER — PREDNISONE 20 MG PO TABS: ORAL_TABLET | ORAL | 0 refills | Status: DC

## 2017-04-14 MED ORDER — DOXYCYCLINE HYCLATE 100 MG PO TABS: 100.0000 mg | ORAL_TABLET | Freq: Two times a day (BID) | ORAL | 0 refills | Status: AC

## 2017-04-14 NOTE — Patient Instructions (Addendum)
Asthma still somewhat flared up- longer course of prednisone with longer periods on 40mg .   This may simply be viral bronchitis though which honestly even antibiotics dont help. Usually 4-6 weeks symptoms.   For the sinus portion we will try doxycycline- this would also give coverage if any of the lung portion is bacterial.

## 2017-04-14 NOTE — Progress Notes (Signed)
Subjective:  Ryan [name] is a 32 y.o. year old very pleasant male patient who presents for/with See problem oriented charting ROS- no fever chills. Am hemoptysis. occasional wheeze and SOB- albuterol improves. No chest pain.    Past Medical History-  Patient Active Problem List   Diagnosis Date Noted  . History of adenomatous polyp of colon 12/21/2016    Priority: Medium  . LLQ abdominal pain 11/03/2016    Priority: Medium  . Chronic low back pain 04/06/2015    Priority: Medium  . Essential hypertension 09/19/2006    Priority: Medium  . Asthma 09/19/2006    Priority: Medium  . Recurrent kidney stones 04/02/2014    Priority: Low  . PANIC ATTACK, ACUTE 08/20/2009    Priority: Low  . Allergic rhinitis 09/19/2006    Priority: Low    Medications- reviewed and updated Current Outpatient Medications  Medication Sig Dispense Refill  . albuterol (PROVENTIL HFA;VENTOLIN HFA) 108 (90 Base) MCG/ACT inhaler Inhale 2 puffs into the lungs every 6 (six) hours as needed for wheezing or shortness of breath. 3 Inhaler 1  . cyclobenzaprine (FLEXERIL) 10 MG tablet Take 1 tablet (10 mg total) by mouth 3 (three) times daily as needed for muscle spasms. 30 tablet 0  . diclofenac (VOLTAREN) 75 MG EC tablet TAKE 1 TABLET BY MOUTH TWICE A DAY 30 tablet 0  . fluticasone (FLONASE) 50 MCG/ACT nasal spray Place 1 spray into both nostrils 2 (two) times daily. 32 g 11  . fluticasone (FLOVENT HFA) 44 MCG/ACT inhaler Inhale 2 puffs into the lungs 2 (two) times daily. 1 Inhaler 12  . ibuprofen (ADVIL,MOTRIN) 200 MG tablet Take 800 mg by mouth 2 (two) times daily as needed for moderate pain.    Marland Kitchen losartan (COZAAR) 100 MG tablet Take 1 tablet (100 mg total) by mouth daily. 100 tablet 3  . montelukast (SINGULAIR) 10 MG tablet Take 1 tablet (10 mg total) by mouth at bedtime. 100 tablet 3  . prednisoLONE acetate (PRED FORTE) 1 % ophthalmic suspension Place 1 drop into the left eye 3 (three) times daily.    .  predniSONE (DELTASONE) 20 MG tablet Take 2 pills for 3 days, 1 pill for 3 days, 1/2 pill for 3 days, 1/2 pill every other day until finished 12 tablet 0  . traMADol (ULTRAM) 50 MG tablet Take 1 tablet (50 mg total) by mouth every 8 (eight) hours as needed. 21 tablet 0   No current facility-administered medications for this visit.     Objective: BP 124/86 (BP Location: Left Arm, Patient Position: Sitting, Cuff Size: Large)   Pulse 86   Temp 98.4 F (36.9 C) (Oral)   Ht 6' (1.829 m)   Wt 208 lb 3.2 oz (94.4 kg)   SpO2 96%   BMI 28.24 kg/m  Gen: NAD, resting comfortably Some dried blood in bilateral nares- along with erythematous turbinates and yellow discharge. Pharynx erythematous CV: RRR no murmurs rubs or gallops Lungs: CTAB no crackles, wheeze, rhonchi Ext: no edema Skin: warm, dry Neuro: grossly normal, moves all extremities  Assessment/Plan:   Cough/nasal congestion/wheeze Bacterial sinusitis  Moderate persistent asthma with exacerbation  Bronchitis  Hemoptysis - Plan: DG Chest 2 View S:  From Mychart messages 04/05/17 "I cam down with my yearly chest cold about a week ago. I have crackling in my chest im coughing up yellow stuff my nose is conjested with yellow mucus. I feel a little bit out of it but i was able to get  rid of my sore throat. At night a cough a ton unless a sleep with a cough drop. I think im going to get some syrup [facility]. Is there a recommended type of syrup to buy? Is it possible for u to put in a prescription of prednisone for me without me having to come in? I leave for [place] on business on sunday night and im so tied up getting ready while being sick i dont know if im going to be able to make it in the office before this weekend. I also saw u had no availabilities before i leave. " We did provide a round of prednisone given patient's asthma history 04/06/17 "So i got the prednisone and started taking it. I got the delsym cough syrup. I  noticed even after the syrup and with the cough dropa i get this tickle in the back of my throat maybe in my chest even but it makes my cough up a lung even after the suppressants. When i cough it makes my throat hurt but only while im coughing and right after for a bit.  " He later reported AM hemoptysis and I advised visit but [place] was [place]- advised urgent care but he declined.   Presents today with continued symptoms. Has had about 2 weeks of symptoms at this point. Took a course of prednisone after [employer] above. Coughed up blood starting about 7 days- usually int he morning- no blood from his nose that he reports. Starts sweating toward end of the day and has to lay down. Still coughing fair amount and seems to be worse at night. Delsym helps slightly in the day. Blows nose in the morning and is yellow- also through the day. Continues to have some sinus pressure though improved on prednisone. Occasional mild shortness of breath. Wheezing some. Doing flovent twice a day and also his albuterol as needed- two days ago used it twice.  A/P: I suspect patient has viral bronchitis which has triggered his asthma- will use another taper of prednisone per instructions given still having to use albuterol within last 48 hours. Patient also now with sinus pressure and congestion over 10 days- will cover bacterial sinusitis with course of doxycycline. He has dried blood in nares- likely source of his Am hemoptysis. Got chest x-ray to rule out pneumonia and evaluate Tb- negative for pneumonia or obvious TB as above- some bronchitic changes. If he has continued blood after course of treatment as outlined consider ENT referral as I do think it is a nasal source.  Lab/Order associations: Hemoptysis - Plan: DG Chest 2 View  Meds ordered this encounter  Medications  . predniSONE (DELTASONE) 20 MG tablet    Sig: Take 2 pills for 5 days, 1 pill for 3 days, 1/2 pill for 3 days, 1/2 pill every other  day until finished    Dispense:  16 tablet    Refill:  0  . doxycycline (VIBRA-TABS) 100 MG tablet    Sig: Take 1 tablet (100 mg total) by mouth 2 (two) times daily for 7 days.    Dispense:  14 tablet    Refill:  0    Return precautions advised.  Garret Reddish, MD

## 2017-04-15 ENCOUNTER — Encounter: Payer: Self-pay | Admitting: Family Medicine

## 2017-04-15 NOTE — Assessment & Plan Note (Signed)
Flares about once a year. Ok to continue current regimen

## 2017-04-20 ENCOUNTER — Telehealth: Payer: Self-pay | Admitting: *Deleted

## 2017-04-20 NOTE — Telephone Encounter (Signed)
No if he has continued symptoms we need to see each other back in office to discuss increasing his controller medications

## 2017-04-20 NOTE — Telephone Encounter (Signed)
Copied from [place] [id number]. Topic: General - Other >> Apr 20, 2017  8:41 AM Carolyn Stare wrote:  Pt cal lto say his son came home with a virus and now he has nausea and wanted to ask Dr Yong Channel if he think any of the med that he is currently on need to extended  [phone]

## 2017-04-20 NOTE — Telephone Encounter (Signed)
Please see message and advise 

## 2017-04-21 NOTE — Telephone Encounter (Signed)
Called and spoke to patient who states he has a sore throat and a cough this morning. He stated he will see how he feels Monday and then see if needs an appointment. I encouraged fluids and rest over the weekend. He verbalized understanding

## 2017-04-26 ENCOUNTER — Other Ambulatory Visit: Payer: Self-pay

## 2017-04-26 MED ORDER — DICLOFENAC SODIUM 75 MG PO TBEC: 75.0000 mg | DELAYED_RELEASE_TABLET | Freq: Two times a day (BID) | ORAL | 0 refills | Status: DC

## 2017-05-02 ENCOUNTER — Encounter: Payer: Self-pay | Admitting: Family Medicine

## 2017-06-05 ENCOUNTER — Other Ambulatory Visit: Payer: Self-pay

## 2017-08-31 ENCOUNTER — Encounter: Payer: Self-pay | Admitting: Family Medicine

## 2017-08-31 ENCOUNTER — Telehealth: Payer: Self-pay | Admitting: Family Medicine

## 2017-08-31 ENCOUNTER — Ambulatory Visit: Payer: BLUE CROSS/BLUE SHIELD | Admitting: Family Medicine

## 2017-08-31 VITALS — BP 136/86 | HR 79 | Temp 97.7°F | Ht 72.0 in | Wt 222.6 lb

## 2017-08-31 DIAGNOSIS — J4541 Moderate persistent asthma with (acute) exacerbation: Secondary | ICD-10-CM

## 2017-08-31 MED ORDER — PREDNISONE 50 MG PO TABS: ORAL_TABLET | ORAL | 0 refills | Status: DC

## 2017-08-31 NOTE — Telephone Encounter (Signed)
Spoke with pt and informed him that Dr Yong Channel does not want to suppress the cough since he is asthmatic. Told him to use his albuterol and wait for prednisone to kick in. If not feeling better by Monday to call the office. TLG

## 2017-08-31 NOTE — Progress Notes (Signed)
Subjective:  Ryan [name] is a 32 y.o. year old very pleasant male patient who presents for/with See problem oriented charting ROS- some sinus pressure but improving. No fever or chills. Some rib pain particularly when he coughs.    Past Medical History-  Patient Active Problem List   Diagnosis Date Noted  . History of adenomatous polyp of colon 12/21/2016    Priority: Medium  . LLQ abdominal pain 11/03/2016    Priority: Medium  . Chronic low back pain 04/06/2015    Priority: Medium  . Essential hypertension 09/19/2006    Priority: Medium  . Asthma 09/19/2006    Priority: Medium  . Recurrent kidney stones 04/02/2014    Priority: Low  . PANIC ATTACK, ACUTE 08/20/2009    Priority: Low  . Allergic rhinitis 09/19/2006    Priority: Low    Medications- reviewed and updated Current Outpatient Medications  Medication Sig Dispense Refill  . albuterol (PROVENTIL HFA;VENTOLIN HFA) 108 (90 Base) MCG/ACT inhaler Inhale 2 puffs into the lungs every 6 (six) hours as needed for wheezing or shortness of breath. 3 Inhaler 1  . fluticasone (FLONASE) 50 MCG/ACT nasal spray Place 1 spray into both nostrils 2 (two) times daily. 32 g 11  . fluticasone (FLOVENT HFA) 44 MCG/ACT inhaler Inhale 2 puffs into the lungs 2 (two) times daily. 1 Inhaler 12  . ibuprofen (ADVIL,MOTRIN) 200 MG tablet Take 800 mg by mouth 2 (two) times daily as needed for moderate pain.    Marland Kitchen losartan (COZAAR) 100 MG tablet Take 1 tablet (100 mg total) by mouth daily. 100 tablet 3  . montelukast (SINGULAIR) 10 MG tablet Take 1 tablet (10 mg total) by mouth at bedtime. 100 tablet 3  . predniSONE (DELTASONE) 50 MG tablet Take 1 tablet daily 7 tablet 0   No current facility-administered medications for this visit.     Objective: BP 136/86 (BP Location: Left Arm, Patient Position: Sitting, Cuff Size: Normal)   Pulse 79   Temp 97.7 F (36.5 C) (Oral)   Ht 6' (1.829 m)   Wt 222 lb 9.6 oz (101 kg)   SpO2 93%   BMI 30.19  kg/m  Gen: NAD, appears somewhat fatigued Mild pressure right maxillary sinus, turbinates erythematous with some yellow discharge, mild erythema pharynx, R ear mildly erythematous CV: RRR no murmurs rubs or gallops Lungs: Diffuse intermittent wheeze.  No crackles or rhonchi  abdomen: soft/nontender  Ext: no edema Skin: warm, dry, no rash  Assessment/Plan:  Moderate persistent asthma with exacerbation S: Patient has felt poorly for 4 days. Had some Sinus pressure but now improving. Chest congestion, Cough.,Some nasal congestion persists. Discharge- Yellow from nose. Coughing up yellow sputum. No treatments tried yet.   He has been wheezing more with this. Mild SOb with coughing fits. No fever. Albuterol 2-3 x a day- helps with wheeze and cough. Former smoker- quit 2013.   Several sick contacts- Wife sick, son sick. He also tripped over sons toys- ankle and knee hurt him- then into lower back and even rib cage- fell on left side. Back and rib cage can hurt when he cough.   A/P: asthma exacerbation. Patient called later in day asking for cough suppressant- we discussed not ideal with asthma but should give prednisone time to kick in.  He later sent a MyChart message asking the same question- he is concerned with the pain he is having associated with the cough- advised since it is bothering him so much that he can schedule Tylenol  and also pick up some dextromethorphan/Delsym. From [facility]:  " Sounds like your asthma has flared up pretty good- lets hit this hard with prednisone for 7 days.   For next 24 hours would use albuterol every 6 hours or so  Right ear slightly red- if you have worsening pain into next week- let me know as I would call in some azithromycin for you which would also give some respiratory coverage (though no clear pneumonia)  Hope you have a safe trip to [place] (Sunday to Friday night of upcoming week) "  Meds ordered this encounter  Medications  . predniSONE (DELTASONE) 50 MG  tablet    Sig: Take 1 tablet daily    Dispense:  7 tablet    Refill:  0    Return precautions advised.  Garret Reddish, MD

## 2017-08-31 NOTE — Patient Instructions (Addendum)
Sounds like your asthma has flared up pretty good- lets hit this hard with prednisone for 7 days.   For next 24 hours would use albuterol every 6 hours or so  Right ear slightly red- if you have worsening pain into next week- let me know as I would call in some azithromycin for you which would also give some respiratory coverage (though no clear pneumonia)  Hope you have a safe trip to [place] (Sunday to Friday night of upcoming week)

## 2017-08-31 NOTE — Telephone Encounter (Signed)
See note

## 2017-08-31 NOTE — Telephone Encounter (Signed)
Copied from [place] [id number]. Topic: Quick Communication - See Telephone Encounter >> Aug 31, 2017  9:42 AM Cleaster Corin, NT wrote: CRM for notification. See Telephone encounter for: 08/31/17.  Pt. Calling to see if there is something that could be sent in for a bad cough or suggest something over the counter.   [id number] [address], [place] [address] Phone: [phone] Fax: [phone]

## 2017-09-01 NOTE — Telephone Encounter (Signed)
I called and left message for patient. I left information for the patient about [facility] Telephone # [phone] since patient stated that he currently sees them for his back.

## 2017-09-04 ENCOUNTER — Encounter: Payer: Self-pay | Admitting: Family Medicine

## 2017-09-04 ENCOUNTER — Telehealth: Payer: Self-pay | Admitting: Family Medicine

## 2017-09-04 MED ORDER — GUAIFENESIN-CODEINE 100-10 MG/5ML PO SOLN: 5.0000 mL | Freq: Four times a day (QID) | ORAL | 0 refills | Status: DC | PRN

## 2017-09-04 MED ORDER — AZITHROMYCIN 250 MG PO TABS: ORAL_TABLET | ORAL | 0 refills | Status: DC

## 2017-09-04 NOTE — Telephone Encounter (Signed)
See MyChart message

## 2017-09-04 NOTE — Telephone Encounter (Signed)
See note

## 2017-09-04 NOTE — Telephone Encounter (Signed)
Copied from [place] [id number]. Topic: General - Other >> Sep 04, 2017  9:54 AM Cecelia Byars, NT wrote: Reason for CRM: Patient called and said he is not feeling better he was told to call in by Dr Yong Channel on today  he still has congestion still coughing up mucus and still has a ear ache and he  would like for Dr Yong Channel to call in him a prescription , at [facility], [place] [phone] (Phone) [phone] (Fax   The patients contact number is [phone] he is currently in [place] at this time.

## 2017-10-13 ENCOUNTER — Encounter: Payer: Self-pay | Admitting: Family Medicine

## 2017-10-13 ENCOUNTER — Ambulatory Visit: Payer: Self-pay | Admitting: Family Medicine

## 2017-10-13 ENCOUNTER — Ambulatory Visit (INDEPENDENT_AMBULATORY_CARE_PROVIDER_SITE_OTHER): Payer: BLUE CROSS/BLUE SHIELD

## 2017-10-13 ENCOUNTER — Ambulatory Visit: Payer: BLUE CROSS/BLUE SHIELD | Admitting: Family Medicine

## 2017-10-13 VITALS — BP 128/76 | HR 73 | Temp 97.8°F | Ht 72.0 in | Wt 229.8 lb

## 2017-10-13 DIAGNOSIS — R1032 Left lower quadrant pain: Secondary | ICD-10-CM

## 2017-10-13 LAB — POC URINALSYSI DIPSTICK (AUTOMATED)
Bilirubin, UA: NEGATIVE
Blood, UA: NEGATIVE
Glucose, UA: NEGATIVE
Ketones, UA: NEGATIVE
Leukocytes, UA: NEGATIVE
Nitrite, UA: NEGATIVE
Protein, UA: NEGATIVE
Spec Grav, UA: 1.025 (ref 1.010–1.025)
Urobilinogen, UA: 0.2 E.U./dL
pH, UA: 5.5 (ref 5.0–8.0)

## 2017-10-13 MED ORDER — SILODOSIN 4 MG PO CAPS: 4.0000 mg | ORAL_CAPSULE | Freq: Every day | ORAL | 0 refills | Status: DC

## 2017-10-13 MED ORDER — OXYCODONE-ACETAMINOPHEN 5-325 MG PO TABS: 1.0000 | ORAL_TABLET | Freq: Four times a day (QID) | ORAL | 0 refills | Status: AC | PRN

## 2017-10-13 MED ORDER — KETOROLAC TROMETHAMINE 60 MG/2ML IM SOLN
60.0000 mg | Freq: Once | INTRAMUSCULAR | Status: AC
  Administered 2017-10-13: 60 mg via INTRAMUSCULAR

## 2017-10-13 NOTE — Telephone Encounter (Signed)
Pt c/o 3 day h/o of left sided, intermittent abdominal pain. Pain also having pain to his left testicle and urethra. Pt stated that he has a h/o kidney stones and that he was told by his urologist that he has 1 "big" one to the left kidney.  Pt stated his urologist prescribed him with Flomax and he passed 13 kidney stones. Denies swelling to the left testicle.  Pt stated that walking or standing makes the pain better and sitting or laying down makes the pain worse. Denies vomiting, constipation. Pt had diarrhea yesterday but none so far today. Care advice given and pt verbalized understanding and to call back if pain worsens. Disposition was to send pt to the ED or PCP/alternative with approval. Called PCP office and spoke with [facility]. Ok to make appt today with Dr Yong Channel at  3:00 pm.  Reason for Disposition . [1] Pain in the scrotum or testicle AND [2] present > 1 hour  Answer Assessment - Initial Assessment Questions 1. LOCATION: "Where does it hurt?"      Under stomach left side to under belt line 2. RADIATION: "Does the pain shoot anywhere else?" (e.g., chest, back)     Under left side of  rib cage to the  Hip. Also having pain at the left testicle and urethra 3. ONSET: "When did the pain begin?" (Minutes, hours or days ago)      2-3 days ago 4. SUDDEN: "Gradual or sudden onset?"     Gradual  5. PATTERN "Does the pain come and go, or is it constant?"    - If constant: "Is it getting better, staying the same, or worsening?"      (Note: Constant means the pain never goes away completely; most serious pain is constant and it progresses)     - If intermittent: "How long does it last?" "Do you have pain now?"     (Note: Intermittent means the pain goes away completely between bouts)     Comes and goes- pain is staying the same 6. SEVERITY: "How bad is the pain?"  (e.g., Scale 1-10; mild, moderate, or severe)    - MILD (1-3): doesn't interfere with normal activities, abdomen soft and not tender  to touch     - MODERATE (4-7): interferes with normal activities or awakens from sleep, tender to touch     - SEVERE (8-10): excruciating pain, doubled over, unable to do any normal activities       Moderate to severe 7. RECURRENT SYMPTOM: "Have you ever had this type of abdominal pain before?" If so, ask: "When was the last time?" and "What happened that time?"      Yes-  Was started on Flomax 2 years ago and referred to urologist and in 1 week passed 13 kidney stones. Pt stated that he has 1 big stone in the left kidney per his urologist 8. CAUSE: "What do you think is causing the abdominal pain?"     Kidney stone 9. RELIEVING/AGGRAVATING FACTORS: "What makes it better or worse?" (e.g., movement, antacids, bowel movement)     Better: walking around worse: sitting in certain positions or laying down 10. OTHER SYMPTOMS: "Has there been any vomiting, diarrhea, constipation, or urine problems?"       Had diarrhea yesterday, frequency sharp pain urethra  Protocols used: ABDOMINAL PAIN - MALE-A-AH

## 2017-10-13 NOTE — Telephone Encounter (Signed)
Noted  

## 2017-10-13 NOTE — Patient Instructions (Addendum)
Do see a stone but don't think its in position to cause pain. Possibly you have passed some stones or some smaller stones I am missing on my read of film.   Call urology today to get in for next week. I will give you a toradol shot which should last through most of today  I would try ibuprofen 400-600mg  as first line for pain at home every 6-8 hours. If that isnt enough for pain control can use sparing percocet. Do not drive for 8 hours after taking this  Reviewed NCCSRS today. Last hydrocodone was back in march by hand surgery- prior to that last oxycodone was last November by a [place]. Was given tramadol by [place] after he developed cellulitis in wrist.  Has 16 providers in last 2 years and this is concerning but also want to make sure to treat acute pain.  In good faith, I am treating patient's pain. If he has new or worsening symptoms- should present to the ER. I gave him a copy of this statement

## 2017-10-13 NOTE — Progress Notes (Signed)
Subjective:  Ryan [name] is a 32 y.o. year old very pleasant male patient who presents for/with See problem oriented charting ROS- complains of LLQ pain radiating to testicle. Some left flank pain. No fever chills/nausea/vomiting.    Past Medical History-  Patient Active Problem List   Diagnosis Date Noted  . History of adenomatous polyp of colon 12/21/2016    Priority: Medium  . LLQ abdominal pain 11/03/2016    Priority: Medium  . Chronic low back pain 04/06/2015    Priority: Medium  . Essential hypertension 09/19/2006    Priority: Medium  . Asthma 09/19/2006    Priority: Medium  . Recurrent kidney stones 04/02/2014    Priority: Low  . PANIC ATTACK, ACUTE 08/20/2009    Priority: Low  . Allergic rhinitis 09/19/2006    Priority: Low    Medications- reviewed and updated Current Outpatient Medications  Medication Sig Dispense Refill  . albuterol (PROVENTIL HFA;VENTOLIN HFA) 108 (90 Base) MCG/ACT inhaler Inhale 2 puffs into the lungs every 6 (six) hours as needed for wheezing or shortness of breath. 3 Inhaler 1  . beclomethasone (QVAR) 40 MCG/ACT inhaler USE 2 PUFFS TWICE A DAY    . dicyclomine (BENTYL) 10 MG capsule Take 1 capsule by mouth 3 (three) times daily.  11  . fluticasone (FLONASE) 50 MCG/ACT nasal spray Place 1 spray into both nostrils 2 (two) times daily. 32 g 11  . fluticasone (FLOVENT HFA) 44 MCG/ACT inhaler Inhale 2 puffs into the lungs 2 (two) times daily. 1 Inhaler 12  . ibuprofen (ADVIL,MOTRIN) 200 MG tablet Take 800 mg by mouth 2 (two) times daily as needed for moderate pain.    Marland Kitchen losartan (COZAAR) 100 MG tablet Take 1 tablet (100 mg total) by mouth daily. 100 tablet 3  . montelukast (SINGULAIR) 10 MG tablet Take 1 tablet (10 mg total) by mouth at bedtime. 100 tablet 3  . oxyCODONE-acetaminophen (PERCOCET/ROXICET) 5-325 MG tablet Take 1 tablet by mouth every 6 (six) hours as needed for up to 5 days for severe pain. 16 tablet 0  . silodosin (RAPAFLO) 4 MG  CAPS capsule Take 1 capsule (4 mg total) by mouth daily with breakfast. 30 capsule 0   No current facility-administered medications for this visit.     Objective: BP 128/76 (BP Location: Left Arm, Patient Position: Sitting, Cuff Size: Large)   Pulse 73   Temp 97.8 F (36.6 C) (Oral)   Ht 6' (1.829 m)   Wt 229 lb 12.8 oz (104.2 kg)   SpO2 94%   BMI 31.17 kg/m  Gen: NAD, resting comfortably CV: RRR no murmurs rubs or gallops Lungs: CTAB no crackles, wheeze, rhonchi Abdomen: soft/moderate tenderness in LLQ/nondistended/normal bowel sounds. No rebound or guarding.  Some left cva tenderness Ext: no edema Skin: warm, dry  No results found for this or any previous visit (from the past 24 hour(s)). Dg Abd 1 View  Result Date: 10/14/2017 CLINICAL DATA:  Left lower quadrant abdominal pain for the past 3 days. EXAM: ABDOMEN - 1 VIEW COMPARISON:  Abdominal x-ray dated June 19, 2017. FINDINGS: Unchanged bilateral renal calculi. No definite ureteral or bladder calculi. Small calcifications in the left hemipelvis are unchanged. Normal bowel gas pattern. No acute osseous abnormality. IMPRESSION: 1. Unchanged bilateral nephrolithiasis. Electronically Signed   By: Titus Dubin M.D.   On: 10/14/2017 09:35   Assessment/Plan:  LLQ abdominal pain S: left lower quadrant pain for 3 days. Comes and goes. Pain at its worst 7-8/10 but not everytime  is it that bad. He also gets testicular pain at the same time as the left lower quadrant pain. Also some left flank pain but not as severe. Episodes last 3-4 to up to 30 minutes. No fever or chills. Some mild nausea when the pain hits.   4-5 episodes a day up to 7-8/10. He has taken flomax and oxycodone in past when trying to pass stone.   he last saw urology 1-2 years ago and had passed 13 small fragments which were apparently analyzed- was told to lay off sodas, stay well hydrated. He states not doing best with sodas or staying well hydrated.  A/P: at least  1 left kidney stone noted though not clear it is in posiiton to cause the pain he is experiencing (unless perhaps he is passing stone fragments as he has in past). Got cbc and CMP to make sure no wbc elevation, lft elevation, anema, or AKI- none on labs. toradol given today. Also sparing percocet given to allow him time to get into urology. Advised if concerned of stones in future- urology is a good first step.   -UA without blood.  -He is going to try to get into urology on MOnday.  - Appendix removed. Has diverticulosis but never had diverticulitis.  - this is a new/different LLQ pain than what he experienced last September- making this a new acute issue. Also requiring medication management. If prior pain after eating returns- would encourage follow up with GI as previously discussed Reviewed NCCSRS today. Last hydrocodone was back in march by hand surgery- prior to that last oxycodone was last November by a [place]. Was given tramadol by [place] after he developed cellulitis in wrist.  Has 16 providers in last 2 years and this is concerning but also want to make sure to treat acute pain.  In good faith, I am treating patient's pain. If he has new or worsening symptoms- should present to the ER. I gave him a copy of this statement  I also asked patient to try to limit mychart messages in future to key points and perhaps 3-4 sentences.   Lab/Order associations: Left lower quadrant pain - Plan: POCT Urinalysis Dipstick (Automated), DG Abd 1 View, ketorolac (TORADOL) injection 60 mg, Basic Metabolic Panel (BMET), CBC w/Diff, CBC w/Diff, Basic Metabolic Panel (BMET), CANCELED: CBC w/Diff, CANCELED: Basic Metabolic Panel (BMET)  LLQ abdominal pain  Meds ordered this encounter  Medications  . oxyCODONE-acetaminophen (PERCOCET/ROXICET) 5-325 MG tablet    Sig: Take 1 tablet by mouth every 6 (six) hours as needed for up to 5 days for severe pain.    Dispense:  16 tablet    Refill:  0  . silodosin  (RAPAFLO) 4 MG CAPS capsule    Sig: Take 1 capsule (4 mg total) by mouth daily with breakfast.    Dispense:  30 capsule    Refill:  0  . ketorolac (TORADOL) injection 60 mg   Return precautions advised.  Garret Reddish, MD

## 2017-10-13 NOTE — Telephone Encounter (Signed)
See note

## 2017-10-14 ENCOUNTER — Encounter: Payer: Self-pay | Admitting: Family Medicine

## 2017-10-14 LAB — CBC WITH DIFFERENTIAL/PLATELET
Basophils Absolute: 102 cells/uL (ref 0–200)
Basophils Relative: 1.1 %
Eosinophils Absolute: 177 cells/uL (ref 15–500)
Eosinophils Relative: 1.9 %
HCT: 44.8 % (ref 38.5–50.0)
Hemoglobin: 15.6 g/dL (ref 13.2–17.1)
Lymphs Abs: 3097 cells/uL (ref 850–3900)
MCH: 32.4 pg (ref 27.0–33.0)
MCHC: 34.8 g/dL (ref 32.0–36.0)
MCV: 92.9 fL (ref 80.0–100.0)
MPV: 10.3 fL (ref 7.5–12.5)
Monocytes Relative: 6.5 %
Neutro Abs: 5320 cells/uL (ref 1500–7800)
Neutrophils Relative %: 57.2 %
Platelets: 288 10*3/uL (ref 140–400)
RBC: 4.82 10*6/uL (ref 4.20–5.80)
RDW: 11.8 % (ref 11.0–15.0)
Total Lymphocyte: 33.3 %
WBC mixed population: 605 cells/uL (ref 200–950)
WBC: 9.3 10*3/uL (ref 3.8–10.8)

## 2017-10-14 LAB — BASIC METABOLIC PANEL
BUN: 9 mg/dL (ref 7–25)
CO2: 21 mmol/L (ref 20–32)
Calcium: 10 mg/dL (ref 8.6–10.3)
Chloride: 107 mmol/L (ref 98–110)
Creat: 0.82 mg/dL (ref 0.60–1.35)
Glucose, Bld: 83 mg/dL (ref 65–99)
Potassium: 4.3 mmol/L (ref 3.5–5.3)
Sodium: 140 mmol/L (ref 135–146)

## 2017-10-14 NOTE — Assessment & Plan Note (Signed)
S: left lower quadrant pain for 3 days. Comes and goes. Pain at its worst 7-8/10 but not everytime is it that bad. He also gets testicular pain at the same time as the left lower quadrant pain. Also some left flank pain but not as severe. Episodes last 3-4 to up to 30 minutes. No fever or chills. Some mild nausea when the pain hits.   4-5 episodes a day up to 7-8/10. He has taken flomax and oxycodone in past when trying to pass stone.   he last saw urology 1-2 years ago and had passed 13 small fragments which were apparently analyzed- was told to lay off sodas, stay well hydrated. He states not doing best with sodas or staying well hydrated.  A/P: at least 1 left kidney stone noted though not clear it is in posiiton to cause the pain he is experiencing (unless perhaps he is passing stone fragments as he has in past). Got cbc and CMP to make sure no wbc elevation, lft elevation, anema, or AKI- none on labs. toradol given today. Also sparing percocet given to allow him time to get into urology. Advised if concerned of stones in future- urology is a good first step.   -UA without blood.  -He is going to try to get into urology on MOnday.  - Appendix removed. Has diverticulosis but never had diverticulitis.  - this is a new/different LLQ pain than what he experienced last September- making this a new acute issue. Also requiring medication management. If prior pain after eating returns- would encourage follow up with GI as previously discussed Reviewed NCCSRS today. Last hydrocodone was back in march by hand surgery- prior to that last oxycodone was last November by a [place]. Was given tramadol by [place] after he developed cellulitis in wrist.  Has 16 providers in last 2 years and this is concerning but also want to make sure to treat acute pain.  In good faith, I am treating patient's pain. If he has new or worsening symptoms- should present to the ER. I gave him a copy of this statement  I also asked  patient to try to limit mychart messages in future to key points and perhaps 3-4 sentences.

## 2017-10-16 NOTE — Telephone Encounter (Signed)
Spoke to pt went over results of x-ray again told him per Dr. Yong Channel your x-rays show unchanged kidney stones on both sides. I did not see the stone on the right- we will look to urology for more answers. Pt verbalized understanding and said he did not realize he had one on the right. Told pt I am not sure if there was something there originally and now gone. Asked pt if he sees Urology? Pt said yes, he sees someone. Told pt should discuss further with Urology. Pt verbalized understanding.

## 2017-10-16 NOTE — Telephone Encounter (Signed)
See note. Patient was returning phone call also.

## 2017-11-09 ENCOUNTER — Other Ambulatory Visit: Payer: Self-pay | Admitting: Family Medicine

## 2017-11-26 ENCOUNTER — Other Ambulatory Visit: Payer: Self-pay | Admitting: Gastroenterology

## 2017-11-26 DIAGNOSIS — R1032 Left lower quadrant pain: Secondary | ICD-10-CM

## 2017-12-20 ENCOUNTER — Other Ambulatory Visit: Payer: Self-pay | Admitting: Gastroenterology

## 2017-12-20 DIAGNOSIS — R1032 Left lower quadrant pain: Secondary | ICD-10-CM

## 2017-12-27 ENCOUNTER — Encounter: Payer: Self-pay | Admitting: Family Medicine

## 2017-12-27 ENCOUNTER — Ambulatory Visit: Payer: BLUE CROSS/BLUE SHIELD | Admitting: Family Medicine

## 2017-12-27 ENCOUNTER — Other Ambulatory Visit: Payer: Self-pay | Admitting: Family Medicine

## 2017-12-27 ENCOUNTER — Ambulatory Visit (INDEPENDENT_AMBULATORY_CARE_PROVIDER_SITE_OTHER): Payer: BLUE CROSS/BLUE SHIELD

## 2017-12-27 ENCOUNTER — Telehealth: Payer: Self-pay | Admitting: Family Medicine

## 2017-12-27 VITALS — BP 136/92 | HR 72 | Temp 97.5°F | Ht 72.0 in | Wt 227.2 lb

## 2017-12-27 DIAGNOSIS — Z87442 Personal history of urinary calculi: Secondary | ICD-10-CM

## 2017-12-27 DIAGNOSIS — N200 Calculus of kidney: Secondary | ICD-10-CM | POA: Diagnosis not present

## 2017-12-27 LAB — POCT URINALYSIS DIPSTICK
Bilirubin, UA: NEGATIVE
Glucose, UA: NEGATIVE
Ketones, UA: NEGATIVE
Leukocytes, UA: NEGATIVE
Nitrite, UA: NEGATIVE
Protein, UA: NEGATIVE
Spec Grav, UA: 1.015 (ref 1.010–1.025)
Urobilinogen, UA: 0.2 E.U./dL
pH, UA: 6 (ref 5.0–8.0)

## 2017-12-27 MED ORDER — ACETAMINOPHEN-CODEINE #3 300-30 MG PO TABS: 1.0000 | ORAL_TABLET | ORAL | 0 refills | Status: DC | PRN
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
## 2017-12-27 NOTE — Telephone Encounter (Signed)
Copied from [place] [id number]. Topic: General - Other >> Dec 27, 2017  3:28 PM Cecelia Byars, NT wrote: Reason for CRM: [facility] called and  needs recent notes in a kidney stone  he will be seen at 8;00 am tomorrow  please fax to [phone] attention Marlowe Kays

## 2017-12-28 LAB — URINE CULTURE
MICRO NUMBER:: 91183895
Result:: NO GROWTH
SPECIMEN QUALITY:: ADEQUATE

## 2017-12-29 ENCOUNTER — Encounter: Payer: Self-pay | Admitting: Family Medicine

## 2017-12-29 NOTE — Telephone Encounter (Signed)
Received paperwork from [facility]. It has been given to Dr. Yong Channel to review

## 2018-01-01 ENCOUNTER — Other Ambulatory Visit: Payer: Self-pay | Admitting: Urology

## 2018-01-02 NOTE — H&P (Signed)
I have a history of kidney stones.  HPI: Ryan [name] is a 32 year-old male established patient who is here for F/U due to a history of renal calculi.  The patient was last seen 06/19/2017. The patient's stone was on his left side. He did not pass a stone since the last office visit.   The patient has had flank pain since they were last seen. The patient denies any progressive voiding symptoms. He has yes seen blood in his urine since the last visit. He is currently having flank pain and back pain. He denies having groin pain, nausea, vomiting, fever, and chills. He has never had surgical treatment for calculi in the past. This condition would be considered of mild to moderate severity with no modifying factors or associated signs or symptoms other than as noted above.   09/06/16: He had a right ureteral stone that was being managed with medical expulsive therapy. After developing what sounds like further renal colic on 07/29/59 a CT scan was obtained in the emergency room which revealed his right hydronephrosis had resolved and his right ureteral stone was no longer present. There were punctate stones seen within the left kidney as well as a single 7 mm stone in the lower pole.   10/03/16: He came in today and reports that beginning 48 hours ago he began having left lower quadrant pain. He said he was not having any voiding symptoms but then indicated he may have noted some increased urinary frequency. He has not been experiencing any hematuria. He is not having any flank pain. He does have known left renal calculi.   11/17/16: Came in today with complaints of some discomfort in his right side. This is been present for about 4 days. He said he also has a feeling of almost a cut sensation at the area the urethra. He has not seen any hematuria. He denies any change in his voiding pattern. He has a history of a left renal calculus but no right renal calculi.   01/11/17: C/o painless hematuria on Friday of  last week. One day later he endorses left lower back, flank, and lower abdominal pain. No other associated worsening voiding symptoms.   06/19/17: He came in today because he noted some left-sided throbbing pain last night. He described the location as the left lower quadrant/groin region with some discomfort in the back. He said for the past couple of weeks he has had some slight discomfort in the urethra but it is very minimal and intermittent. He has not seen any hematuria he has not had any fever, chills or nausea.   12/28/17: He was seen yesterday by his primary care physician with a reported four-day history of left lower back pain. Urinalysis was dipstick positive for blood with no microscopic performed. A KUB was performed that revealed no stones along the anticipated course of the left ureter with what appear to be about 4 stones within the left kidney when compared to his KUB in 7/19 left renal calculi appear to be identical in number and location. He reports he did note dark appearing urine yesterday suggesting gross hematuria. He continues to have constant pain in the left back/flank region. No voiding symptoms.     ALLERGIES: Penicillins - Hives    MEDICATIONS: Hydrocodone-Acetaminophen 5 mg-325 mg tablet 1 tablet PO Q 6 H  Losartan Potassium 50 MG Oral Tablet Oral  Qvar AERS Inhalation  Reglan 10 mg tablet     GU PSH: None  NON-GU PSH: Appendectomy - 2014    GU PMH: LLQ pain (Acute), Left, He has had some left lower quadrant and left back pain. I told him there is a possibility may have passed a small stone but with clear urine and no evidence of any calcification along the course of his ureters I have reassured him that it appears if there was a stone present it has now passed. - 06/19/2017, (Acute), Left, He has left lower quadrant pain that does not have a left ureteral stone. I will contact him with results of his CT scan when it is been read by the radiologist., -  10/03/2016 Renal calculus, Left - 01/11/2017, (Stable), Left, His left renal calculus is unchanged and I see no stones passing with completely clear urine. I have reassured him that this is not urologic in origin, - 11/17/2016 (Stable), Left, His 2 left renal calculi are noted on his KUB and appear unchanged. It doesn't appear that he is passing a stone currently., - 10/03/2016 (Stable), Left, He has a right renal calculus. He has a stone analysis that is pending., - 09/06/2016, Bilateral, - 08/15/2016, Nephrolithiasis, - 2014 Ureteral calculus (Improving, Acute), Right, He now has passed his right ureteral stone. - 09/06/2016, (Acute), Right, He has 2 left ureteral calculi but the larger stone is more distal and measures 3 mm in width. We discussed the fact that his stone is not dense enough that I can see it on a plain x-ray and therefore he would not be a candidate for lithotripsy. We did discuss ureteroscopic extraction of the stone but he said he did not want to undergo ureteroscopy if possible and wanted to proceed with medical expulsive therapy. I therefore am going to increase his tamsulosin 0.8 mg and give him some stronger pain medication and we'll see him back in 2 weeks for a KUB and a right renal ultrasound unless he has passed his stone., - September 01, 2016 (Acute), Right, - 08/15/2016 Abdominal Pain Unspec, Abdominal pain - 2014 Continuous incontinence, Urinary incontinence with continuous leakage - 2014 Dysuria, Dysuria - 2014 Hypospadias, balanic, Glandular hypospadias - 2014 Other microscopic hematuria, Microscopic hematuria - 2014 Urethral Stricture, Unspec, Meatal stenosis - 2014 Urinary Frequency, Increased urinary frequency - 2014 Urinary Tract Inf, Unspec site, Urinary tract infection - 2014 Weak Urinary Stream, Weak urinary stream - 2014      PMH Notes: 1 - Microscopic Hematuria + Abdominal/Flank Pain + Nephrolithiasis - Pt with subacute onset of left sided abdominal pain 4 days following  epidural injection for MSK back pain . Had CT stone study 5/17 which revealed bilateral intrarenal 11mm stones, no hydro. Referring UA with blood only, no infectious parameters. No prior stone passage events.    2 - Lower Urinary Tract Symptoms - Pt with new obstuctive voding symtpoms with sensation of straining to void x 2 days. PVR "71mL" 07/2012 during symptom episode. He has a urethral dimple and there was some concernt of possible stricture, cysto 07/2012 (today) without any narrowing. He does have above h/o back problems. Deneis new LE deficits.     NON-GU PMH: Rash and other nonspecific skin eruption, Right, He has a rash involving his right side which seems to be on for a systemic reaction. It is quite annoying so I'm going to prescribe a Medrol Dosepak but I told him that if he did not note improvement he would need to be seen by a dermatologist. - 09/06/2016 Asthma, Asthma - 2014 Constipation, unspecified, Constipation - 2014 Personal history  of other diseases of the circulatory system, History of hypertension - 2014 Personal history of other diseases of the digestive system, History of esophageal reflux - 2014 Anxiety Encounter for general adult medical examination without abnormal findings, Encounter for preventive health examination GERD Hypertension    FAMILY HISTORY: Diabetes - Runs In Family Hematuria - Father, Mother Hypertension - Runs In Family nephrolithiasis - Mother   SOCIAL HISTORY: Marital Status: Married Preferred Language: English; Ethnicity: Not Hispanic Or Latino; Race: White Current Smoking Status: Patient does not smoke anymore.   Tobacco Use Assessment Completed: Used Tobacco in last 30 days? Has never drank.  Drinks 3 caffeinated drinks per day.    REVIEW OF SYSTEMS:    GU Review Male:   Patient denies frequent urination, hard to postpone urination, burning/ pain with urination, get up at night to urinate, leakage of urine, stream starts and stops, trouble  starting your stream, have to strain to urinate , erection problems, and penile pain.  Gastrointestinal (Upper):   Patient denies nausea, vomiting, and indigestion/ heartburn.  Gastrointestinal (Lower):   Patient denies diarrhea and constipation.  Constitutional:   Patient denies fever, night sweats, weight loss, and fatigue.  Skin:   Patient denies skin rash/ lesion and itching.  Eyes:   Patient denies blurred vision and double vision.  Ears/ Nose/ Throat:   Patient denies sore throat and sinus problems.  Hematologic/Lymphatic:   Patient denies swollen glands and easy bruising.  Cardiovascular:   Patient denies leg swelling and chest pains.  Respiratory:   Patient denies cough and shortness of breath.  Endocrine:   Patient denies excessive thirst.  Musculoskeletal:   Patient reports back pain. Patient denies joint pain.  Neurological:   Patient denies headaches and dizziness.  Psychologic:   Patient denies depression and anxiety.   VITAL SIGNS:      12/28/2017 08:09 AM  Weight 226 lb / 102.51 kg  BP 152/94 mmHg  Pulse 72 /min  Temperature 97.8 F / 36.5 C   PAST DATA REVIEWED:  Source Of History:  Patient  Lab Test Review:   BUN/Creatinine, Calcium, Ionized  Records Review:   Previous Patient Records, POC Tool  X-Ray Review: KUB: Reviewed Films.    Notes:                     In 7/19 his creatinine was noted to be normal at 0.82 with a normal serum calcium of 10.0.   PROCEDURES:         C.T. Urogram - [id number]      Independent review of his CT scan reveals a 6 x 6 mm stone located in the renal pelvis on the left-hand side with Hounsfield units of 700.          Urinalysis w/Scope Dipstick Dipstick Cont'd Micro  Color: Yellow Bilirubin: Neg mg/dL WBC/hpf: NS (Not Seen)  Appearance: Clear Ketones: Neg mg/dL RBC/hpf: 3 - 10/hpf  Specific Gravity: 1.025 Blood: 3+ ery/uL Bacteria: NS (Not Seen)  pH: 5.5 Protein: 1+ mg/dL Cystals: NS (Not Seen)  Glucose: Neg mg/dL Urobilinogen: 0.2  mg/dL Casts: NS (Not Seen)    Nitrites: Neg Trichomonas: Not Present    Leukocyte Esterase: Neg leu/uL Mucous: Not Present      Epithelial Cells: NS (Not Seen)      Yeast: NS (Not Seen)      Sperm: Not Present    ASSESSMENT:      ICD-10 Details  1 GU:   Ureteral calculus -  N20.1 Left, Acute - He has a stone located at the UPJ on the left-hand side that is 6 mm and he has elected proceed with medical expulsive therapy after discussing the options for treatment if his stone does not progress he would like to proceed with lithotripsy.  2   Renal calculus - N20.0 Left, Stable - He renal calculi that are all very small and peripherally located.  3   Low back pain - M54.5 Left, Acute - His back pain is due to his stone which moved from the periphery of his kidney to the ureteropelvic junction  4   Gross hematuria - R31.0 His gross hematuria appears to be secondary to a stone located at the UPJ on the left-hand side.              Notes:   We discussed the management of urinary stones. These options include observation, ureteroscopy, shockwave lithotripsy, and PCNL. We discussed which options are relevant to these particular stones. We discussed the natural history of stones as well as the complications of untreated stones and the impact on quality of life without treatment as well as with each of the above listed treatments. We also discussed the efficacy of each treatment in its ability to clear the stone burden. With any of these management options I discussed the signs and symptoms of infection and the need for emergent treatment should these be experienced. For each option we discussed the ability of each procedure to clear the patient of their stone burden.   For observation I described the risks which include but are not limited to silent renal damage, life-threatening infection, need for emergent surgery, failure to pass stone, and pain.   For ureteroscopy I described the risks which include  heart attack, stroke, pulmonary embolus, death, bleeding, infection, damage to contiguous structures, positioning injury, ureteral stricture, ureteral avulsion, ureteral injury, need for ureteral stent, inability to perform ureteroscopy, need for an interval procedure, inability to clear stone burden, stent discomfort and pain.   For shockwave lithotripsy I described the risks which include arrhythmia, kidney contusion, kidney hemorrhage, need for transfusion, long-term risk of diabetes or hypertension, back discomfort, flank ecchymosis, flank abrasion, inability to break up stone, inability to pass stone fragments, Steinstrasse, infection associated with obstructing stones, need for different surgical procedure, need for repeat shockwave lithotripsy, and death   After a thorough review of the management options for the patient's condition the patient  elected to proceed with surgical therapy as noted above. We have discussed the potential benefits and risks of the procedure, side effects of the proposed treatment, the likelihood of the patient achieving the goals of the procedure, and any potential problems that might occur during the procedure or recuperation. Informed consent has been obtained.

## 2018-01-04 ENCOUNTER — Ambulatory Visit (HOSPITAL_COMMUNITY)
Admission: RE | Admit: 2018-01-04 | Discharge: 2018-01-04 | Disposition: A | Discharge status: Home / Self Care | Payer: BLUE CROSS/BLUE SHIELD | Source: Other Acute Inpatient Hospital | Attending: Urology | Admitting: Urology

## 2018-01-04 ENCOUNTER — Encounter (HOSPITAL_COMMUNITY): Payer: Self-pay | Admitting: General Practice

## 2018-01-04 ENCOUNTER — Ambulatory Visit (HOSPITAL_COMMUNITY): Payer: BLUE CROSS/BLUE SHIELD

## 2018-01-04 ENCOUNTER — Encounter (HOSPITAL_COMMUNITY)
Admission: RE | Disposition: A | Discharge status: Home / Self Care | Payer: Self-pay | Source: Other Acute Inpatient Hospital | Attending: Urology

## 2018-01-04 DIAGNOSIS — Z880 Allergy status to penicillin: Secondary | ICD-10-CM | POA: Diagnosis not present

## 2018-01-04 DIAGNOSIS — K219 Gastro-esophageal reflux disease without esophagitis: Secondary | ICD-10-CM | POA: Diagnosis not present

## 2018-01-04 DIAGNOSIS — Z95810 Presence of automatic (implantable) cardiac defibrillator: Secondary | ICD-10-CM | POA: Insufficient documentation

## 2018-01-04 DIAGNOSIS — R310 Gross hematuria: Secondary | ICD-10-CM | POA: Diagnosis not present

## 2018-01-04 DIAGNOSIS — Z79899 Other long term (current) drug therapy: Secondary | ICD-10-CM | POA: Diagnosis not present

## 2018-01-04 DIAGNOSIS — J45909 Unspecified asthma, uncomplicated: Secondary | ICD-10-CM | POA: Diagnosis not present

## 2018-01-04 DIAGNOSIS — Z87442 Personal history of urinary calculi: Secondary | ICD-10-CM | POA: Diagnosis not present

## 2018-01-04 DIAGNOSIS — I10 Essential (primary) hypertension: Secondary | ICD-10-CM | POA: Diagnosis not present

## 2018-01-04 DIAGNOSIS — N202 Calculus of kidney with calculus of ureter: Secondary | ICD-10-CM | POA: Diagnosis not present

## 2018-01-04 DIAGNOSIS — N135 Crossing vessel and stricture of ureter without hydronephrosis: Secondary | ICD-10-CM

## 2018-01-04 DIAGNOSIS — Z87891 Personal history of nicotine dependence: Secondary | ICD-10-CM | POA: Diagnosis not present

## 2018-01-04 HISTORY — PX: EXTRACORPOREAL SHOCK WAVE LITHOTRIPSY: SHX1557

## 2018-01-04 SURGERY — LITHOTRIPSY, ESWL
Anesthesia: LOCAL | Laterality: Left

## 2018-01-04 MED ORDER — CIPROFLOXACIN HCL 500 MG PO TABS
500.0000 mg | ORAL_TABLET | ORAL | Status: AC
  Administered 2018-01-04: 500 mg via ORAL
  Filled 2018-01-04: qty 1

## 2018-01-04 MED ORDER — DIPHENHYDRAMINE HCL 25 MG PO CAPS
25.0000 mg | ORAL_CAPSULE | ORAL | Status: AC
  Administered 2018-01-04: 25 mg via ORAL
  Filled 2018-01-04: qty 1

## 2018-01-04 MED ORDER — SODIUM CHLORIDE 0.9 % IV SOLN
Freq: Once | INTRAVENOUS | Status: AC
  Administered 2018-01-04: 09:00:00 via INTRAVENOUS

## 2018-01-04 MED ORDER — DIAZEPAM 5 MG PO TABS
10.0000 mg | ORAL_TABLET | ORAL | Status: AC
  Administered 2018-01-04: 10 mg via ORAL
  Filled 2018-01-04: qty 2

## 2018-01-04 NOTE — Interval H&P Note (Signed)
History and Physical Interval Note:  01/04/2018 8:48 AM  Ryan [name]  has presented today for surgery, with the diagnosis of LEFT URETEROPELVIC JUNCTION STONE  The various methods of treatment have been discussed with the patient and family. After consideration of risks, benefits and other options for treatment, the patient has consented to  Procedure(s): LEFT EXTRACORPOREAL SHOCK WAVE LITHOTRIPSY (ESWL) (Left) as a surgical intervention .  The patient's history has been reviewed, patient examined, no change in status, stable for surgery.  I have reviewed the patient's chart and labs.  Questions were answered to the patient's satisfaction.     Germani Gavilanes A

## 2018-01-05 ENCOUNTER — Encounter (HOSPITAL_COMMUNITY): Payer: Self-pay | Admitting: Urology

## 2018-01-10 ENCOUNTER — Other Ambulatory Visit: Payer: Self-pay

## 2018-01-10 MED ORDER — FLUTICASONE PROPIONATE 50 MCG/ACT NA SUSP: 1.0000 | Freq: Two times a day (BID) | NASAL | 2 refills | Status: DC

## 2018-02-27 ENCOUNTER — Other Ambulatory Visit: Payer: Self-pay

## 2018-02-27 ENCOUNTER — Telehealth: Payer: Self-pay | Admitting: Family Medicine

## 2018-02-27 MED ORDER — LOSARTAN POTASSIUM 100 MG PO TABS: 100.0000 mg | ORAL_TABLET | Freq: Every day | ORAL | 3 refills | Status: DC

## 2018-02-27 NOTE — Telephone Encounter (Signed)
MEDICATION: losartan (COZAAR) 100 MG tablet  PHARMACY:  [facility]   IS THIS A 90 DAY SUPPLY : yes  IS PATIENT OUT OF MEDICATION: unsure  IF NOT; HOW MUCH IS LEFT:   LAST APPOINTMENT DATE: @07 /19/19  NEXT APPOINTMENT DATE:@Visit  date not found  OTHER COMMENTS:    **Let patient know to contact pharmacy at the end of the day to make sure medication is ready. **  ** Please notify patient to allow 48-72 hours to process**  **Encourage patient to contact the pharmacy for refills or they can request refills through [facility]**

## 2018-02-27 NOTE — Telephone Encounter (Signed)
Prescription sent to pharmacy as requested.

## 2018-02-28 ENCOUNTER — Telehealth: Payer: Self-pay | Admitting: Family Medicine

## 2018-02-28 NOTE — Telephone Encounter (Signed)
I am mailing a list of providers to patient. I called him to let him know. He verbalized understanding

## 2018-02-28 NOTE — Telephone Encounter (Signed)
See note  Copied from [place] [id number]. Topic: General - Inquiry >> Feb 28, 2018  1:51 PM Ryan [name], NT wrote: Reason for CRM: patient is calling and wants to know who Dr. Yong Channel would recommend him taking his daughter to as a psychiatrist  that prescribes medicine. He states he has reached out to multiple offices including [facility] but has not had any luck.

## 2018-03-06 ENCOUNTER — Other Ambulatory Visit: Payer: Self-pay

## 2018-03-06 MED ORDER — MONTELUKAST SODIUM 10 MG PO TABS: 10.0000 mg | ORAL_TABLET | Freq: Every day | ORAL | 3 refills | Status: DC

## 2018-03-26 ENCOUNTER — Telehealth: Payer: Self-pay | Admitting: Family Medicine

## 2018-03-26 NOTE — Telephone Encounter (Signed)
See note and advise on scheduling  Copied from [place] [id number]. Topic: Appointment Scheduling - Scheduling Inquiry for Clinic >> Mar 26, 2018 11:20 AM Virl Axe D wrote: Reason for CRM: Pt called to schedule a sick appt. He is has having coughing and congestion. He would like to be seen today by Dr. Yong Channel if possible. He did schedule appt for tomorrow at 10a but would like to be seen today if he can be worked in. Please advise pt. [phone]

## 2018-03-26 NOTE — Telephone Encounter (Signed)
No Availability today. Patient scheduled for tomorrow

## 2018-03-27 ENCOUNTER — Encounter: Payer: Self-pay | Admitting: Family Medicine

## 2018-03-27 ENCOUNTER — Ambulatory Visit: Payer: BLUE CROSS/BLUE SHIELD | Admitting: Family Medicine

## 2018-03-27 VITALS — BP 148/102 | HR 78 | Temp 98.1°F | Ht 72.0 in | Wt 223.2 lb

## 2018-03-27 DIAGNOSIS — I10 Essential (primary) hypertension: Secondary | ICD-10-CM

## 2018-03-27 DIAGNOSIS — R52 Pain, unspecified: Secondary | ICD-10-CM | POA: Diagnosis not present

## 2018-03-27 DIAGNOSIS — J0100 Acute maxillary sinusitis, unspecified: Secondary | ICD-10-CM

## 2018-03-27 DIAGNOSIS — H66001 Acute suppurative otitis media without spontaneous rupture of ear drum, right ear: Secondary | ICD-10-CM | POA: Diagnosis not present

## 2018-03-27 LAB — POC INFLUENZA A&B (BINAX/QUICKVUE)
Influenza A, POC: NEGATIVE
Influenza B, POC: NEGATIVE

## 2018-03-27 MED ORDER — PREDNISONE 20 MG PO TABS: ORAL_TABLET | ORAL | 0 refills | Status: DC

## 2018-03-27 MED ORDER — DOXYCYCLINE HYCLATE 100 MG PO TABS: 100.0000 mg | ORAL_TABLET | Freq: Two times a day (BID) | ORAL | 0 refills | Status: AC

## 2018-03-27 NOTE — Assessment & Plan Note (Signed)
S: Poorly controlled on no medication-patient has not yet taken his losartan today BP Readings from Last 3 Encounters:  03/27/18 (!) 148/102  01/04/18 (!) 148/98  12/27/17 (!) 136/92  A/P: We discussed blood pressure goal of <140/90. Continue current meds: Strongly advised him to take his losartan as soon as he gets home due to elevation.  I believe it is reasonable to still use prednisone since he will be getting the losartan soon.  It looks like blood pressure has been trending up lately- last 2 visits were associated with kidney stones though-I asked him to schedule a follow-up visit with me- I may no longer be on his insurance plan next year though so encouraged him to follow-up with new provider if that is the case.

## 2018-03-27 NOTE — Progress Notes (Signed)
PCP: Marin Olp, MD  Subjective:  Ryan [name] is a 32 y.o. year old very pleasant male patient who presents with symptoms including nasal congestion, sinus tenderness, cough, body aches, subjective fevers.  He also notes some left ear pain.  -other symptoms include: Saturday started with mild cough. Had sore throta but that has improved. Nasal congestion and some dizziness. Worsened 2 nights ago- sweating and coughing cycles both noted. Has felt feverish but hasnt measured. Some headaches and bodyaches. Had friend with flu but this was a few weeks ago.  Did have flu shot in October.  Has yellow discharge from nose -day of illness:Day 4 of illness today.  -Symptoms are worsening -previous treatments: Has tried delsym, ibuprofen, acetaminophen, cough drops. Coughing fits are worse at night. Still using flovent and singulair for asthma.   ROS-denies fever, SOB, NVD, tooth pain  Pertinent Past Medical History-  Patient Active Problem List   Diagnosis Date Noted  . History of adenomatous polyp of colon 12/21/2016    Priority: Medium  . LLQ abdominal pain 11/03/2016    Priority: Medium  . Chronic low back pain 04/06/2015    Priority: Medium  . Essential hypertension 09/19/2006    Priority: Medium  . Asthma 09/19/2006    Priority: Medium  . Recurrent kidney stones 04/02/2014    Priority: Low  . PANIC ATTACK, ACUTE 08/20/2009    Priority: Low  . Allergic rhinitis 09/19/2006    Priority: Low    Medications- reviewed  Current Outpatient Medications  Medication Sig Dispense Refill  . acetaminophen-codeine (TYLENOL #3) 300-30 MG tablet Take 1 tablet by mouth every 4 (four) hours as needed for moderate pain. 30 tablet 0  . albuterol (PROVENTIL HFA;VENTOLIN HFA) 108 (90 Base) MCG/ACT inhaler Inhale 2 puffs into the lungs every 6 (six) hours as needed for wheezing or shortness of breath. 3 Inhaler 1  . beclomethasone (QVAR) 40 MCG/ACT inhaler USE 2 PUFFS TWICE A DAY    .  dicyclomine (BENTYL) 10 MG capsule TAKE 1 CAPSULE BY MOUTH THREE TIMES A DAY AS NEEDED 90 capsule 0  . FLOVENT HFA 44 MCG/ACT inhaler INHALE 2 PUFFS INTO THE LUNGS TWICE DAILY 31.8 Inhaler 3  . fluticasone (FLONASE) 50 MCG/ACT nasal spray Place 1 spray into both nostrils 2 (two) times daily. 32 g 2  . HYDROcodone-acetaminophen (NORCO/VICODIN) 5-325 MG tablet Take 1-2 tablets by mouth every 4 (four) hours.    Marland Kitchen ibuprofen (ADVIL,MOTRIN) 200 MG tablet Take 800 mg by mouth 2 (two) times daily as needed for moderate pain.    Marland Kitchen losartan (COZAAR) 100 MG tablet Take 1 tablet (100 mg total) by mouth daily. 100 tablet 3  . montelukast (SINGULAIR) 10 MG tablet Take 1 tablet (10 mg total) by mouth at bedtime. 100 tablet 3  . tamsulosin (FLOMAX) 0.4 MG CAPS capsule Take 0.4 mg by mouth.    . doxycycline (VIBRA-TABS) 100 MG tablet Take 1 tablet (100 mg total) by mouth 2 (two) times daily for 7 days. 14 tablet 0  . predniSONE (DELTASONE) 20 MG tablet Take 2 pills for 3 days, 1 pill for 4 days 10 tablet 0   No current facility-administered medications for this visit.     Objective: BP (!) 148/102 (BP Location: Left Arm, Patient Position: Sitting, Cuff Size: Large)   Pulse 78   Temp 98.1 F (36.7 C) (Oral)   Ht 6' (1.829 m)   Wt 223 lb 3.2 oz (101.2 kg)   SpO2 95%  BMI 30.27 kg/m  Gen: NAD, resting comfortably HEENT: Turbinates erythematous with yellow drainage, TM normal on left-on right tympanic membrane is markedly erythematous, pharynx mildly erythematous with no tonsilar exudate or edema, bilateral maxillary sinus tenderness-also has left frontal sinus tenderness CV: RRR no murmurs rubs or gallops Lungs: CTAB no crackles, wheeze, rhonchi Abdomen: soft/nontender/nondistended Ext: no edema Skin: warm, dry, no rash  Assessment/Plan:  Right otitis media- asthma exacerbation-Sinsusitis Patient likely with viral sinusitis with under 10 days of symptoms.  With that being said-he does have a right  otitis media which we need to cover.  He has penicillin allergy with hives-we opted to use doxycycline which will give your coverage as well as sinusitis coverage if there is a bacterial element.  This also seems to be triggering his asthma with nighttime coughing fits and wheezing  Other Treatment: -considered steroid: we opted in due to asthma history and nighttime coughing fits and wheezing-prednisone taper written  Finally, we reviewed reasons to return to care including if symptoms worsen or persist or new concerns arise (particularly fever or shortness of breath)   Essential hypertension S: Poorly controlled on no medication-patient has not yet taken his losartan today BP Readings from Last 3 Encounters:  03/27/18 (!) 148/102  01/04/18 (!) 148/98  12/27/17 (!) 136/92  A/P: We discussed blood pressure goal of <140/90. Continue current meds: Strongly advised him to take his losartan as soon as he gets home due to elevation.  I believe it is reasonable to still use prednisone since he will be getting the losartan soon.  It looks like blood pressure has been trending up lately- last 2 visits were associated with kidney stones though-I asked him to schedule a follow-up visit with me- I may no longer be on his insurance plan next year though so encouraged him to follow-up with new provider if that is the case.     Meds ordered this encounter  Medications  . predniSONE (DELTASONE) 20 MG tablet    Sig: Take 2 pills for 3 days, 1 pill for 4 days    Dispense:  10 tablet    Refill:  0  . doxycycline (VIBRA-TABS) 100 MG tablet    Sig: Take 1 tablet (100 mg total) by mouth 2 (two) times daily for 7 days.    Dispense:  14 tablet    Refill:  0   Garret Reddish, MD

## 2018-03-27 NOTE — Patient Instructions (Addendum)
Blood pressure has been running high lately- if we are still in your network recommend 1 month recheck.  If we are not in your network recommend you seeing a new physician to check on her blood pressure.  Due to the right ear infection-we are going to treat with doxycycline-this also covers sinus infections though likely your sinus infection is viral at this point.  We will also use prednisone which may help with sinus infection as well as any asthma flareup.

## 2018-03-29 ENCOUNTER — Other Ambulatory Visit: Payer: Self-pay | Admitting: Family Medicine

## 2018-03-29 ENCOUNTER — Encounter: Payer: Self-pay | Admitting: Family Medicine

## 2018-03-29 MED ORDER — PROMETHAZINE-DM 6.25-15 MG/5ML PO SYRP: 5.0000 mL | ORAL_SOLUTION | Freq: Every evening | ORAL | 0 refills | Status: DC | PRN

## 2018-04-02 NOTE — Telephone Encounter (Signed)
We cannot use this medication because of blood pressure still high-please inform patient

## 2018-04-03 MED ORDER — PREDNISONE 20 MG PO TABS: ORAL_TABLET | ORAL | 0 refills | Status: DC

## 2018-04-03 NOTE — Addendum Note (Signed)
Addended by: Marin Olp on: 04/03/2018 12:50 PM   Modules accepted: Orders

## 2018-06-30 ENCOUNTER — Other Ambulatory Visit: Payer: Self-pay | Admitting: Gastroenterology

## 2018-06-30 DIAGNOSIS — R1032 Left lower quadrant pain: Secondary | ICD-10-CM

## 2018-07-12 ENCOUNTER — Other Ambulatory Visit: Payer: Self-pay | Admitting: Family Medicine

## 2018-07-27 ENCOUNTER — Other Ambulatory Visit: Payer: Self-pay | Admitting: Gastroenterology

## 2018-07-27 DIAGNOSIS — R1032 Left lower quadrant pain: Secondary | ICD-10-CM

## 2019-01-17 ENCOUNTER — Other Ambulatory Visit: Payer: Self-pay | Admitting: Family Medicine

## 2019-03-15 ENCOUNTER — Other Ambulatory Visit: Payer: Self-pay | Admitting: Family Medicine

## 2019-05-01 ENCOUNTER — Other Ambulatory Visit: Payer: Self-pay

## 2019-05-02 ENCOUNTER — Encounter: Payer: Self-pay | Admitting: Family Medicine

## 2019-05-02 ENCOUNTER — Ambulatory Visit (INDEPENDENT_AMBULATORY_CARE_PROVIDER_SITE_OTHER): Payer: 59 | Admitting: Family Medicine

## 2019-05-02 ENCOUNTER — Ambulatory Visit: Payer: BLUE CROSS/BLUE SHIELD | Admitting: Family Medicine

## 2019-05-02 VITALS — BP 138/88 | HR 83 | Temp 98.4°F | Ht 72.0 in | Wt 228.4 lb

## 2019-05-02 DIAGNOSIS — J4540 Moderate persistent asthma, uncomplicated: Secondary | ICD-10-CM | POA: Diagnosis not present

## 2019-05-02 DIAGNOSIS — M5416 Radiculopathy, lumbar region: Secondary | ICD-10-CM | POA: Diagnosis not present

## 2019-05-02 DIAGNOSIS — I10 Essential (primary) hypertension: Secondary | ICD-10-CM

## 2019-05-02 DIAGNOSIS — B070 Plantar wart: Secondary | ICD-10-CM

## 2019-05-02 MED ORDER — BUDESONIDE-FORMOTEROL FUMARATE 160-4.5 MCG/ACT IN AERO: 2.0000 | INHALATION_SPRAY | Freq: Two times a day (BID) | RESPIRATORY_TRACT | 12 refills | Status: DC

## 2019-05-02 MED ORDER — LOSARTAN POTASSIUM-HCTZ 100-25 MG PO TABS: 1.0000 | ORAL_TABLET | Freq: Every day | ORAL | 3 refills | Status: DC

## 2019-05-02 MED ORDER — ALBUTEROL SULFATE HFA 108 (90 BASE) MCG/ACT IN AERS: 2.0000 | INHALATION_SPRAY | Freq: Four times a day (QID) | RESPIRATORY_TRACT | 5 refills | Status: DC | PRN

## 2019-05-02 NOTE — Assessment & Plan Note (Signed)
#   Low back pain with radiculopathy into left leg S: Patient continues to suffer from left low back pain-at present radiating to the left leg.  Previously followed by Raliegh Ip but he needs someone within the "[facility]" network for right health insurance.  Patient is wanting a referral to consider injections in lumbar spine area for continued issues with back pain  Getting a lot of muscle tension in left low back with radiation into left leg.  Muscle relaxers do seem to help A/P: Poor control-refer to [facility] health Ortho care

## 2019-05-02 NOTE — Assessment & Plan Note (Signed)
S: compliant with hydrochlorothiazide 100-25 mg.  He had been cared for by [facility] in 2020 due to insurance changes.  They added the hydrochlorothiazide to his losartan 100 mg.  Home monitoring - highest 140/80 but typically 130/80-85  BP Readings from Last 3 Encounters:  05/02/19 (!) 144/84  03/27/18 (!) 148/102  01/04/18 (!) 148/98  A/P: Poor control in office but good control at home.  We opted to continue current medications-could check home cuff in the office in the future-may have whitecoat element

## 2019-05-02 NOTE — Progress Notes (Signed)
Phone [phone] In person visit   Subjective:   Ryan [name] is a 34 y.o. year old very pleasant male patient who presents for/with See problem oriented charting Chief Complaint  Patient presents with  . Follow-up  . refill on bp meds and referral   This visit occurred during the SARS-CoV-2 public health emergency.  Safety protocols were in place, including screening questions prior to the visit, additional usage of staff PPE, and extensive cleaning of exam room while observing appropriate contact time as indicated for disinfecting solutions.   Past Medical History-  Patient Active Problem List   Diagnosis Date Noted  . History of adenomatous polyp of colon 12/21/2016    Priority: Medium  . LLQ abdominal pain 11/03/2016    Priority: Medium  . Chronic low back pain 04/06/2015    Priority: Medium  . Essential hypertension 09/19/2006    Priority: Medium  . Asthma 09/19/2006    Priority: Medium  . Recurrent kidney stones 04/02/2014    Priority: Low  . PANIC ATTACK, ACUTE 08/20/2009    Priority: Low  . Allergic rhinitis 09/19/2006    Priority: Low  . Plantar wart, left foot 05/02/2019    Medications- reviewed and updated Current Outpatient Medications  Medication Sig Dispense Refill  . albuterol (PROVENTIL HFA;VENTOLIN HFA) 108 (90 Base) MCG/ACT inhaler Inhale 2 puffs into the lungs every 6 (six) hours as needed for wheezing or shortness of breath. 3 Inhaler 1  . beclomethasone (QVAR) 40 MCG/ACT inhaler     . dicyclomine (BENTYL) 10 MG capsule TAKE 1 CAPSULE BY MOUTH THREE TIMES A DAY AS NEEDED 90 capsule 0  . FLOVENT HFA 44 MCG/ACT inhaler INHALE 2 PUFFS INTO THE LUNGS TWICE DAILY 31.8 Inhaler 3  . fluticasone (FLONASE) 50 MCG/ACT nasal spray PLACE 1 SPRAY INTO BOTH NOSTRILS 2 (TWO) TIMES DAILY 48 mL 1  . ibuprofen (ADVIL,MOTRIN) 200 MG tablet Take 800 mg by mouth 2 (two) times daily as needed for moderate pain.    Marland Kitchen losartan-hydrochlorothiazide (HYZAAR) 100-25  MG tablet Take by mouth.    . montelukast (SINGULAIR) 10 MG tablet TAKE 1 TABLET BY MOUTH EVERYDAY AT BEDTIME 90 tablet 0  . tamsulosin (FLOMAX) 0.4 MG CAPS capsule Take 0.4 mg by mouth.     No current facility-administered medications for this visit.     Objective:  BP 138/88   Pulse 83   Temp 98.4 F (36.9 C)   Ht 6' (1.829 m)   Wt 228 lb 6.4 oz (103.6 kg)   SpO2 97%   BMI 30.98 kg/m  Gen: NAD, resting comfortably CV: RRR no murmurs rubs or gallops Lungs: CTAB no crackles, wheeze, rhonchi Abdomen: soft/nontender/nondistended/normal bowel sounds. No rebound or guarding.  Ext: no edema Skin: warm, dry, 7 areas on left foot that appear to be consistent with wart-largest 1 year left heel approximately a centimeter in size of built-up skin-when pared down was less than 1 cm  Procedure note: Benefits and risks verbally discussed with patient Area cleansed with alcohol-significant paring down with scalpel was performed 10 second freeze thaw cycle x2 of cryotherapy performed  with liquid nitrogen to wart on left foot No complications.  Patient tolerated the procedure well other than mild pain. Gave handout on this from sl[facility]an [facility] and we reviewed this content thoroughly [email or web address]     Assessment and Plan   # social update- mainly working from home and pay is less with less travel- misses [place] trips except sinuses are better with not  going.   #hypertension S: compliant with hydrochlorothiazide 100-25 mg.  He had been cared for by [facility] in 2020 due to insurance changes.  They added the hydrochlorothiazide to his losartan 100 mg.  Home monitoring - highest 140/80 but typically 130/80-85  BP Readings from Last 3 Encounters:  05/02/19 138/88  03/27/18 (!) 148/102  01/04/18 (!) 148/98  A/P: Poor control in office initially but improved on repeat-reports  good control at home.  Reasonable control we opted to  continue current medications-could check home cuff in the office in the future-may have whitecoat element   # Low back pain with radiculopathy into left leg S: Patient continues to suffer from left low back pain-at present radiating to the left leg.  Previously followed by Ralie[facility] Ip but he needs someone within the "[facility]" network for right health insurance.  Patient is wanting a referral to consider injections in lumbar spine area for continued issues with back pain  Getting a lot of muscle tension in left low back with radiation into left leg.  Muscle relaxers do seem to help A/P: Poor control-refer to [facility]  #Asthma S: Patient is compliant with Flovent 110 mg twice a day.  This was increased by [facility] from 44 mg previously.  Despite this and also using Singulair-patient reports using albuterol almost on daily basis. A/P: Poor control of asthma-advance to Symbicort twice a day as long as affordable.  Consider alternate steroid/LABA if expensive.  We will also refill his albuterol-discussed goal would be for him to use this once a week or last  #Plantar warts-left foot S: Patient with multiple plantar warts only 7 on the left foot.  There is one near the heel that is particular bothersome it hurts when he walks.  Seems like this 1 is growing. A/P: Large plantar wart treated with cryotherapy today.  Discussed could repeat after area heals if wart seems to not resolved.  Also discussed alternate trial of salicylic acid both for starting treatment or follow-up treatment-he is going to consider using it for follow-up treatment.   Recommended follow up: Patient scheduled physical in 6 months as requested Future Appointments  Date Time Provider [place]  05/10/2019  3:00 PM Hilts, Legrand Como, MD [facility] None  11/01/2019  2:40 PM Marin Olp, MD [facility]    Lab/Order associations:   ICD-10-CM   1. Lumbar back pain with radiculopathy affecting left lower  extremity  M54.16 Ambulatory referral to Orthopedics  2. Plantar wart, left foot  B07.0   3. Essential hypertension  I10   4. Chronic bilateral low back pain with left-sided sciatica  M54.42    G89.29   5. Moderate persistent asthma without complication  123456     Meds ordered this encounter  Medications  . losartan-hydrochlorothiazide (HYZAAR) 100-25 MG tablet    Sig: Take 1 tablet by mouth daily.    Dispense:  90 tablet    Refill:  3  . budesonide-formoterol (SYMBICORT) 160-4.5 MCG/ACT inhaler    Sig: Inhale 2 puffs into the lungs 2 (two) times daily.    Dispense:  1 Inhaler    Refill:  12  . albuterol (VENTOLIN HFA) 108 (90 Base) MCG/ACT inhaler    Sig: Inhale 2 puffs into the lungs every 6 (six) hours as needed for wheezing or shortness of breath.    Dispense:  18 g    Refill:  5    Return precautions advised.  Garret Reddish, MD

## 2019-05-02 NOTE — Assessment & Plan Note (Signed)
S: Patient is compliant with Flovent 110 mg twice a day.  This was increased by [facility] from 44 mg previously.  Despite this and also using Singulair-patient reports using albuterol almost on daily basis. A/P: Poor control of asthma-advance to Symbicort twice a day as long as affordable.  Consider alternate steroid/LABA if expensive.  We will also refill his albuterol-discussed goal would be for him to use this once a week or last

## 2019-05-02 NOTE — Patient Instructions (Addendum)
Health Maintenance Due  Topic Date Due  . HIV Screening-can consider with future blood work 08/15/1998  . INFLUENZA VACCINE -04/12/2019 10/27/2018   Refill blood pressure medicine.  Lets try to schedule a physical sometime in the next 6 months to recheck blood pressure and update blood work  Once the area on your foot heals up if you still have a wart present please return to see [place] within the next 3 to 4 weeks so we can refreeze-alternatively you can try the salicylic acid per handout we gave you  Refill blood pressure medicine-ideally blood pressure would remain 130/80 or less on average.  If your average gets above 138/88 then we would need to consider increasing medicine   We will call you within two weeks about your referral to [facility] care. If you do not hear within 3 weeks, give [place] a call.    Lets try Symbicort instead of Flovent to see if that is more helpful.  Info on cost from their website _______________________________________ What can I expect to pay for SYMBICORT? The list price for a 30-day supply of SYMBICORT is $303.42* (80/4.5 mcg) and $346.83* (160/4.5 mcg). However, it is important to understand that this list price may not be reflective of your cost for SYMBICORT. Your out-of-pocket costs? are determined by your insurance type.  The information below may help you estimate your cost for SYMBICORT based on your insurance, but your insurance provider can provide more specific information.  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 _______________________________________

## 2019-05-02 NOTE — Assessment & Plan Note (Signed)
S: Patient with multiple plantar warts only 7 on the left foot.  There is one near the heel that is particular bothersome it hurts when he walks.  Seems like this 1 is growing. A/P: Large plantar wart treated with cryotherapy today.  Discussed could repeat after area heals if wart seems to not resolved.  Also discussed alternate trial of salicylic acid both for starting treatment or follow-up treatment-he is going to consider using it for follow-up treatment.

## 2019-05-10 ENCOUNTER — Ambulatory Visit: Payer: 59 | Admitting: Family Medicine

## 2019-05-17 ENCOUNTER — Ambulatory Visit: Payer: 59 | Admitting: Family Medicine

## 2019-05-20 ENCOUNTER — Encounter: Payer: Self-pay | Admitting: Family Medicine

## 2019-05-22 ENCOUNTER — Ambulatory Visit (INDEPENDENT_AMBULATORY_CARE_PROVIDER_SITE_OTHER): Payer: 59 | Admitting: Family Medicine

## 2019-05-22 ENCOUNTER — Ambulatory Visit: Payer: 59 | Admitting: Physician Assistant

## 2019-05-22 ENCOUNTER — Ambulatory Visit (INDEPENDENT_AMBULATORY_CARE_PROVIDER_SITE_OTHER): Payer: 59

## 2019-05-22 ENCOUNTER — Encounter: Payer: Self-pay | Admitting: Family Medicine

## 2019-05-22 ENCOUNTER — Encounter: Payer: Self-pay | Admitting: Physician Assistant

## 2019-05-22 ENCOUNTER — Other Ambulatory Visit: Payer: Self-pay

## 2019-05-22 VITALS — BP 138/90 | HR 81 | Temp 97.9°F | Ht 72.0 in | Wt 227.0 lb

## 2019-05-22 VITALS — BP 140/88 | HR 92 | Ht 72.0 in | Wt 227.6 lb

## 2019-05-22 DIAGNOSIS — M5416 Radiculopathy, lumbar region: Secondary | ICD-10-CM | POA: Diagnosis not present

## 2019-05-22 DIAGNOSIS — M25562 Pain in left knee: Secondary | ICD-10-CM | POA: Diagnosis not present

## 2019-05-22 DIAGNOSIS — M25552 Pain in left hip: Secondary | ICD-10-CM

## 2019-05-22 MED ORDER — HYDROCODONE-ACETAMINOPHEN 5-325 MG PO TABS: 1.0000 | ORAL_TABLET | Freq: Four times a day (QID) | ORAL | 0 refills | Status: DC | PRN

## 2019-05-22 MED ORDER — PREDNISONE 50 MG PO TABS: 50.0000 mg | ORAL_TABLET | Freq: Every day | ORAL | 0 refills | Status: DC

## 2019-05-22 MED ORDER — PREGABALIN 75 MG PO CAPS: 75.0000 mg | ORAL_CAPSULE | Freq: Two times a day (BID) | ORAL | 3 refills | Status: DC

## 2019-05-22 NOTE — Patient Instructions (Addendum)
Thank you for coming in today. Schedule PT.  Take the prednisone for 5 days.  Use lyrica for nerve pain 2x daily.  Use hydrocodone for severe pain.  Get xray now.  Anticipate MRI soon.   Recheck with me in a few weeks or following MRI.   Come back or go to the emergency room if you notice new weakness new numbness problems walking or bowel or bladder problems.

## 2019-05-22 NOTE — Patient Instructions (Signed)
It was great to see you!  A referral has been placed for you to see Dr. Evan Corey with [facility]. Someone from there office will be in touch soon regarding your appointment with him. His location: [facility] at [place] [address] on the 1st floor.   Phone number [phone], Fax [phone].  This location is across the street from the entrance to [facility] and in the same complex as the [facility] and [facility]   Take care,  Khalib Fendley PA-C  

## 2019-05-22 NOTE — Progress Notes (Signed)
Patient was referred to [place] with Dr. [name].   Appointment with me was cancelled, no charge.
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
## 2019-05-23 ENCOUNTER — Encounter: Payer: Self-pay | Admitting: Physician Assistant

## 2019-05-23 MED ORDER — PREGABALIN 75 MG PO CAPS: 75.0000 mg | ORAL_CAPSULE | Freq: Two times a day (BID) | ORAL | 3 refills | Status: DC

## 2019-05-23 MED ORDER — HYDROCODONE-ACETAMINOPHEN 5-325 MG PO TABS: 1.0000 | ORAL_TABLET | Freq: Four times a day (QID) | ORAL | 0 refills | Status: DC | PRN

## 2019-05-23 NOTE — Progress Notes (Signed)
X-ray knee shows no fractures.  No severe arthritis.

## 2019-05-23 NOTE — Progress Notes (Signed)
X-ray lumbar spine shows arthritis but no fractures.  Would you like me to order the MRI that we discussed?

## 2019-05-23 NOTE — Progress Notes (Signed)
X-ray left hip shows medium hip arthritis.  The radiologist did not comment on this but based on the way your hip is formed you have increased risk of developing arthritis sooner.  This could explain some of the pain in the front part of her hip or groin.  We may consider trial of hip joint injection as this may help with the pain and help me figure out how much of the pain is coming from the hip joint versus from a potential pinched nerve in the back.  Recommend either proceeding with lumbar MRI or left hip injection in clinic.  Return to clinic to proceed with injection if not better after prednisone course.

## 2019-05-24 ENCOUNTER — Encounter: Payer: Self-pay | Admitting: Family Medicine

## 2019-05-24 ENCOUNTER — Telehealth: Payer: Self-pay | Admitting: Family Medicine

## 2019-05-24 NOTE — Telephone Encounter (Signed)
Patient called stating that last night he had to carry a 70 pound suitcase up the stairs and since then his left and right leg have been hurting along with both shoulders and down his arm. He is really concerned and didn't know what he needed to do. He also mentioned that he cannot raise his arms all the way up.  Please advise.

## 2019-05-24 NOTE — Telephone Encounter (Signed)
Called and spoke to pt.  Advised to con't using medications Dr. Georgina Snell prescribed as prescribed.  Also advised pt to use ice on his shoulders for 10-15 min every hour as needed for pain and inflammation.  Pt states that he is feeling better at this point after taking some of his medications.  Inform pt that if he doesn't con't to improve through the weekend that he can call and make a f/u appt next week.  Pt verbalizes understanding.

## 2019-05-24 NOTE — Telephone Encounter (Signed)
I am happy to recheck again next week if needed.  If not improved.  Sounds like he may have overdone it by lifting a heavy suitcase upstairs.

## 2019-05-26 ENCOUNTER — Encounter: Payer: Self-pay | Admitting: Family Medicine

## 2019-05-27 ENCOUNTER — Telehealth: Payer: Self-pay | Admitting: Family Medicine

## 2019-05-27 MED ORDER — HYDROCODONE-ACETAMINOPHEN 5-325 MG PO TABS: 1.0000 | ORAL_TABLET | Freq: Four times a day (QID) | ORAL | 0 refills | Status: DC | PRN

## 2019-05-27 MED ORDER — PREDNISONE 50 MG PO TABS: 50.0000 mg | ORAL_TABLET | Freq: Every day | ORAL | 0 refills | Status: DC

## 2019-05-27 NOTE — Telephone Encounter (Signed)
Patient called stating that he has still been having a lot of pain from his leg to his toe. He wants to go ahead with the MRI and possible hip injection that Dr Georgina Snell mentioned.  He also requested a refill on HYDROcodone-acetaminophen (NORCO/VICODIN) 5-325 MG tablet (he has been taking two because one was not helping) and predniSONE (DELTASONE) 50 MG tablet sent to [facility] on [address]   Please advise.  - Also, see past MyChart messages from him regarding the pain and other issues he has been having over the weekend.

## 2019-05-27 NOTE — Telephone Encounter (Signed)
Medications refilled to pharmacy.  Patient notified via MyChart message.  Phone call back not necessary if patient has read my chart.

## 2019-05-28 ENCOUNTER — Ambulatory Visit: Payer: Self-pay

## 2019-05-28 ENCOUNTER — Other Ambulatory Visit: Payer: Self-pay

## 2019-05-28 ENCOUNTER — Ambulatory Visit (INDEPENDENT_AMBULATORY_CARE_PROVIDER_SITE_OTHER): Payer: 59 | Admitting: Family Medicine

## 2019-05-28 ENCOUNTER — Ambulatory Visit: Payer: 59 | Admitting: Physical Therapy

## 2019-05-28 ENCOUNTER — Encounter: Payer: Self-pay | Admitting: Family Medicine

## 2019-05-28 VITALS — BP 132/86 | HR 106 | Ht 72.0 in | Wt 225.8 lb

## 2019-05-28 DIAGNOSIS — M5416 Radiculopathy, lumbar region: Secondary | ICD-10-CM | POA: Diagnosis not present

## 2019-05-28 DIAGNOSIS — M25552 Pain in left hip: Secondary | ICD-10-CM

## 2019-05-28 MED ORDER — OXYCODONE-ACETAMINOPHEN 10-325 MG PO TABS: 1.0000 | ORAL_TABLET | Freq: Three times a day (TID) | ORAL | 0 refills | Status: DC | PRN

## 2019-05-28 NOTE — Progress Notes (Addendum)
I, [name], LAT, ATC, am serving as scribe for Dr. [name].  Ryan [name] is a 34 y.o. male who presents to [place] at [facility] today for f/u of his L hip, low back and L leg pain.  He was last seen by Dr. Georgina Snell on 05/22/19 and was c/o radiating L leg pain from his L hip to his L ankle, worst from L hip to L knee.  He was prescribed a short course of prednisone, Lyrical and hydrocodone-acetaminophen.  He was also referred to outpatient PT.  Since his last visit, pt reports continued L leg pain that has worsened and now runs from his L hip to his L great toe.  He is now reporting pain in the R LE which is likely due to the increased weight-bearing on this side from favoring his R leg due to the pain he has in his L leg.  He also has B shoulder pain (L>R) due to lifting and carrying some heavy suitcases upstairs for his father.  Today, pt reports con't L leg pain that is worse in the L thigh and to the L knee and then continues laterally along his L lower leg, crosses the ankle and runs into the L great toe.  B shoulder pain con't w/ L shoulder worse than R that he feels is associated w/ carrying heavy suitcases upstairs for his father.  He states that he can raise his arms to 90 deg flexion but has much more restricted L shoulder aBd.  He con't to take the prednisone, Lyrica and hyrdrocodone-acetaminophen.  Pain located anterior groin and hip to anterior thigh.  Additionally pain radiates down medial lower leg to medial aspect of great toe.  Hydrocodone has not been very helpful.  He is taking 2 of them at a time total of 10 mg and notes that only helps a little.   Pertinent review of systems: No fevers or chills.  Relevant historical information: History of prior lumbar radiculopathy prior back injections.   Exam:  BP 132/86 (BP Location: Left Arm, Patient Position: Sitting, Cuff Size: Large)   Pulse (!) 106   Ht 6' (1.829 m)   Wt 225 lb 12.8 oz (102.4 kg)    SpO2 95%   BMI 30.62 kg/m  General: Well Developed, well nourished, and in no acute distress.   MSK:  Left hip: Normal-appearing decreased motion nontender to palpation. Lower extremity strength is intact.  Antalgic gait.    Lab and Radiology Results  Procedure: Real-time Ultrasound Guided  left femoral acetabular joint hip injection Device: Philips Affiniti 50G Images permanently stored and available for review in the ultrasound unit. Verbal informed consent obtained.  Discussed risks and benefits of procedure. Warned about infection bleeding damage to structures skin hypopigmentation and fat atrophy among others. Patient expresses understanding and agreement Time-out conducted.   Noted no overlying erythema, induration, or other signs of local infection.   Skin prepped in a sterile fashion.   Local anesthesia: Topical Ethyl chloride.   With sterile technique and under real time ultrasound guidance:  40 mg of Kenalog and 3 mg of Marcaine injected easily.   Completed without difficulty   Pain moderately resolved suggesting accurate placement of the medication.   Advised to call if fevers/chills, erythema, induration, drainage, or persistent bleeding.   Images permanently stored and available for review in the ultrasound unit.  Impression: Technically successful ultrasound guided injection.  Addendum: Patient contacted me the evening after he got  home from the shot and notes that he had significant symptom improvement indicating intra-articular hip pain is a major cause of his overall leg pain.   Assessment and Plan: 34 y.o. male with left leg pain.  Multifactorial.  Anterior groin and hip pain due to pain from femoral acetabular joint.  Patient has evidence of femoral acetabular impingement based on my interpretation of x-ray from last visit.  He had moderate pain benefit immediately following diagnostic and therapeutic injection.  Hopefully will have better pain control with  the steroid component of the injection going forward.  Additionally he has very bothersome pain down his lower leg in a L4 dermatomal pattern.  His pain is escalating and worsening.  Given his worsening pain and worsening symptoms we will proceed with MRI for epidural steroid injection planning.  So far he is [place] failing conservative management for this issue.  Continue Lyrica okay to increase dose to 150 twice daily. Stop hydrocodone start oxycodone 10 mg. Follow-up after MRI.    Orders Placed This Encounter  Procedures  . [place] LIMITED JOINT SPACE STRUCTURES LOW LEFT(NO LINKED CHARGES)    Order Specific Question:   Reason for Exam (SYMPTOM  OR DIAGNOSIS REQUIRED)    Answer:   left hip inj    Order Specific Question:   Preferred imaging location?    Answer:   [place]  . MR Lumbar Spine Wo Contrast    Standing Status:   Future    Standing Expiration Date:   07/27/2020    Order Specific Question:   What is the patient's sedation requirement?    Answer:   No Sedation    Order Specific Question:   Does the patient have a pacemaker or implanted devices?    Answer:   No    Order Specific Question:   Preferred imaging location?    Answer:   [facility] (table limit-350lbs)    Order Specific Question:   Radiology Contrast Protocol - do NOT remove file path    Answer:   [email or web address]   Meds ordered this encounter  Medications  . oxyCODONE-acetaminophen (PERCOCET) 10-325 MG tablet    Sig: Take 1 tablet by mouth every 8 (eight) hours as needed for pain.    Dispense:  20 tablet    Refill:  0     Discussed warning signs or symptoms. Please see discharge instructions. Patient expresses understanding.   The above documentation has been reviewed and is accurate and complete [name]

## 2019-05-28 NOTE — Patient Instructions (Signed)
Thank you for coming in today. Call or go to the ER if you develop a large red swollen joint with extreme pain or oozing puss.  Plan for MRI of the low back to evaluate radiculopathy.  Use oxycodone sparingly for severe pain,  Continue lyrica.  OK to take 2 lyrica twice daily.  If that works let me know and I will send in the bigger lyrica pill.   Plan to recheck following MRI.

## 2019-05-30 ENCOUNTER — Encounter: Payer: Self-pay | Admitting: Family Medicine

## 2019-05-30 ENCOUNTER — Telehealth: Payer: Self-pay | Admitting: Family Medicine

## 2019-05-30 NOTE — Telephone Encounter (Signed)
Received authorization response from insurance regarding Lyrica  Your insurance has formally denied the request for Lyrica (pregabalin).  This is not a huge surprise as the insurance reasons to use Lyrica are for not a condition that I am using it for. Recommend continuing to get the Lyrica using good Rx.  Message also sent via [place]

## 2019-05-31 ENCOUNTER — Ambulatory Visit: Payer: 59 | Admitting: Family Medicine

## 2019-05-31 ENCOUNTER — Encounter: Payer: Self-pay | Admitting: Family Medicine

## 2019-05-31 MED ORDER — PREGABALIN 150 MG PO CAPS: 150.0000 mg | ORAL_CAPSULE | Freq: Two times a day (BID) | ORAL | 3 refills | Status: DC

## 2019-05-31 MED ORDER — OXYCODONE-ACETAMINOPHEN 10-325 MG PO TABS: 1.0000 | ORAL_TABLET | Freq: Three times a day (TID) | ORAL | 0 refills | Status: DC | PRN

## 2019-05-31 NOTE — Telephone Encounter (Signed)
Hello [name],  It looks like the MRI has been scheduled for the seventh.  I should have answers for you Monday about your back. Continue Lyrica.  Okay to try to increase the Lyrica to 2 pills twice daily. Oxycodone has been refilled.  Try to use this medication sparingly.  You will hear from my office Monday.  If you need anything else over the weekend please schedule response to me before about 1 PM today.   Ryan [name]

## 2019-06-02 ENCOUNTER — Other Ambulatory Visit: Payer: Self-pay

## 2019-06-02 ENCOUNTER — Encounter: Payer: Self-pay | Admitting: Family Medicine

## 2019-06-02 ENCOUNTER — Ambulatory Visit (INDEPENDENT_AMBULATORY_CARE_PROVIDER_SITE_OTHER): Payer: 59

## 2019-06-02 DIAGNOSIS — M5416 Radiculopathy, lumbar region: Secondary | ICD-10-CM | POA: Diagnosis not present

## 2019-06-03 ENCOUNTER — Telehealth: Payer: Self-pay | Admitting: Vascular Surgery

## 2019-06-03 ENCOUNTER — Encounter: Payer: Self-pay | Admitting: Family Medicine

## 2019-06-03 ENCOUNTER — Ambulatory Visit: Payer: Self-pay

## 2019-06-03 ENCOUNTER — Ambulatory Visit: Payer: 59

## 2019-06-03 ENCOUNTER — Telehealth: Payer: Self-pay | Admitting: Family Medicine

## 2019-06-03 ENCOUNTER — Ambulatory Visit (INDEPENDENT_AMBULATORY_CARE_PROVIDER_SITE_OTHER): Payer: 59 | Admitting: Family Medicine

## 2019-06-03 VITALS — BP 148/90 | HR 117 | Ht 72.0 in | Wt 222.2 lb

## 2019-06-03 DIAGNOSIS — M949 Disorder of cartilage, unspecified: Secondary | ICD-10-CM

## 2019-06-03 DIAGNOSIS — M25512 Pain in left shoulder: Secondary | ICD-10-CM

## 2019-06-03 DIAGNOSIS — M25552 Pain in left hip: Secondary | ICD-10-CM

## 2019-06-03 DIAGNOSIS — I10 Essential (primary) hypertension: Secondary | ICD-10-CM

## 2019-06-03 DIAGNOSIS — M899 Disorder of bone, unspecified: Secondary | ICD-10-CM

## 2019-06-03 DIAGNOSIS — R7989 Other specified abnormal findings of blood chemistry: Secondary | ICD-10-CM

## 2019-06-03 DIAGNOSIS — R5383 Other fatigue: Secondary | ICD-10-CM | POA: Diagnosis not present

## 2019-06-03 DIAGNOSIS — M2550 Pain in unspecified joint: Secondary | ICD-10-CM | POA: Diagnosis not present

## 2019-06-03 DIAGNOSIS — R202 Paresthesia of skin: Secondary | ICD-10-CM | POA: Diagnosis not present

## 2019-06-03 DIAGNOSIS — M791 Myalgia, unspecified site: Secondary | ICD-10-CM

## 2019-06-03 DIAGNOSIS — M5416 Radiculopathy, lumbar region: Secondary | ICD-10-CM

## 2019-06-03 DIAGNOSIS — M79672 Pain in left foot: Secondary | ICD-10-CM

## 2019-06-03 DIAGNOSIS — M25562 Pain in left knee: Secondary | ICD-10-CM

## 2019-06-03 LAB — COMPREHENSIVE METABOLIC PANEL
ALT: 72 U/L — ABNORMAL HIGH (ref 0–53)
AST: 44 U/L — ABNORMAL HIGH (ref 0–37)
Albumin: 4.2 g/dL (ref 3.5–5.2)
Alkaline Phosphatase: 128 U/L — ABNORMAL HIGH (ref 39–117)
BUN: 13 mg/dL (ref 6–23)
CO2: 24 mEq/L (ref 19–32)
Calcium: 9.8 mg/dL (ref 8.4–10.5)
Chloride: 99 mEq/L (ref 96–112)
Creatinine, Ser: 0.66 mg/dL (ref 0.40–1.50)
GFR: 136.72 mL/min (ref >60.00)
Glucose, Bld: 181 mg/dL — ABNORMAL HIGH (ref 70–99)
Potassium: 3.3 mEq/L — ABNORMAL LOW (ref 3.5–5.1)
Sodium: 134 mEq/L — ABNORMAL LOW (ref 135–145)
Total Bilirubin: 0.5 mg/dL (ref 0.2–1.2)
Total Protein: 8 g/dL (ref 6.0–8.3)

## 2019-06-03 LAB — CBC WITH DIFFERENTIAL/PLATELET
Basophils Absolute: 0.2 10*3/uL — ABNORMAL HIGH (ref 0.0–0.1)
Basophils Relative: 1.2 % (ref 0.0–3.0)
Eosinophils Absolute: 0.7 10*3/uL (ref 0.0–0.7)
Eosinophils Relative: 3.9 % (ref 0.0–5.0)
HCT: 45.6 % (ref 39.0–52.0)
Hemoglobin: 15.7 g/dL (ref 13.0–17.0)
Lymphocytes Relative: 16.6 % (ref 12.0–46.0)
Lymphs Abs: 3.2 10*3/uL (ref 0.7–4.0)
MCHC: 34.5 g/dL (ref 30.0–36.0)
MCV: 92.7 fl (ref 78.0–100.0)
Monocytes Absolute: 0.9 10*3/uL (ref 0.1–1.0)
Monocytes Relative: 4.7 % (ref 3.0–12.0)
Neutro Abs: 14.3 10*3/uL — ABNORMAL HIGH (ref 1.4–7.7)
Neutrophils Relative %: 73.6 % (ref 43.0–77.0)
Platelets: 338 10*3/uL (ref 150.0–400.0)
RBC: 4.92 Mil/uL (ref 4.22–5.81)
RDW: 12.7 % (ref 11.5–15.5)
WBC: 19.4 10*3/uL (ref 4.0–10.5)

## 2019-06-03 LAB — VITAMIN D 25 HYDROXY (VIT D DEFICIENCY, FRACTURES): VITD: 22.1 ng/mL — ABNORMAL LOW (ref 30.00–100.00)

## 2019-06-03 LAB — SEDIMENTATION RATE: Sed Rate: 74 mm/hr — ABNORMAL HIGH (ref 0–15)

## 2019-06-03 LAB — TSH: TSH: 0.88 u[IU]/mL (ref 0.35–4.50)

## 2019-06-03 LAB — URIC ACID: Uric Acid, Serum: 3.3 mg/dL — ABNORMAL LOW (ref 4.0–7.8)

## 2019-06-03 LAB — CK: Total CK: 52 U/L (ref 7–232)

## 2019-06-03 LAB — VITAMIN B12: Vitamin B-12: 276 pg/mL (ref 211–911)

## 2019-06-03 NOTE — Addendum Note (Signed)
Addended by: Gregor Hams on: 06/03/2019 04:18 PM   Modules accepted: Orders

## 2019-06-03 NOTE — Telephone Encounter (Signed)
Pt called around 545 pm today complaining of bilateral foot coolness numbness and tingling as well as leg pain left > right.  He has a history of chronic back pain.  He is currently undergoing extensive evaluation of this pain by Dr [name] with sports medicine.  Work up has been ongoing since early February of this year but patient states pain has become intolerable at this point.  He has appointment at our office tomorrow for ABIs for diagnostic testing as part of his work up but not an MD visit.    I told patient if his pain was intolerable he could go to the [facility] ER for evaluation.  Ruta Hinds, MD [facility] Office: [phone]

## 2019-06-03 NOTE — Progress Notes (Signed)
I, [name], LAT, ATC, am serving as scribe for Dr. [name].  Ryan [name] is a 34 y.o. male who presents to [place] at [facility] today for L-spine MRI review and f/u of his low back, L hip, L leg and now L foot/toe pain.  He has also been having B shoulder pain after carrying some heavier suitcases upstairs for his father.  He was last seen by Dr. Georgina Snell on 05/28/19 and was referred for an  . L-spine MRI.  Since his last visit, pt reports that he has been having increased pain and  numbness/tingling in his L foot and toes and swelling in both his hands and feet.  He con't to take his prescribed medications to include prednisone, Lyrica and hydrocodone-acetaminophen.  He reports numbness in the toes of his B feet and in his hands.  He notes his feet and toes appear dusky or purple with decreased sensation.  He notes that he does have significant pain into his feet bilaterally left worse than right.   He is also having swelling in B hands, L>R. He also notes significant left shoulder pain.  Pain is worse with overhead motion reaching back. Pain started after attempting to lift a heavy object.   Pertinent review of systems: No fevers or chills.  Patient notes positive hand swelling fatigue muscle and joint pain diffusely.  He notes numbness and tingling and some sharp pain into his feet.  He notes extremity tingling and numbness.  He notes significant distress due to the pain.  Relevant historical information: History of hypertension.  History decreased TSH in the past.   Exam:  BP (!) 148/90 (BP Location: Left Arm, Patient Position: Sitting, Cuff Size: Large)   Pulse (!) 117   Ht 6' (1.829 m)   Wt 222 lb 3.2 oz (100.8 kg)   SpO2 96%   BMI 30.14 kg/m  General: Well Developed, well nourished, and in no acute distress.   MSK:  Left shoulder: Normal-appearing Nontender. Motion abduction very limited 90 degrees maximum active and passive. External  rotation and internal rotation full. Strength: 3/5 abduction with pain.  5/5 external and internal rotation. Positive empty can test. Very positive Hawkins and Neer's test with significant guarding just for positioning. Negative Yergason's and speeds test.  L-spine: Decreased motion.  Feet bilaterally are somewhat purple or dusky appearing with decreased dorsal pedis pulses bilaterally.  Decreased capillary refill bilaterally.  Sensation intact to light touch.    Lab and Radiology Results No results found for this or any previous visit (from the past 72 hour(s)). MR Lumbar Spine Wo Contrast  Result Date: 06/03/2019 CLINICAL DATA:  34 year old male with progressive chronic low back pain radiating to the legs right greater than left with weakness. EXAM: MRI LUMBAR SPINE WITHOUT CONTRAST TECHNIQUE: Multiplanar, multisequence MR imaging of the lumbar spine was performed. No intravenous contrast was administered. COMPARISON:  Lumbar MRI 10/12/2016.  Lumbar radiographs 05/22/2019. FINDINGS: Segmentation: Normal, the same numbering system used on the 2018 MRI. Alignment: Chronic levoconvex lumbar scoliosis and mild upper lumbar kyphosis appears stable since 2018. No significant spondylolisthesis. Vertebrae: No marrow edema or evidence of acute osseous abnormality. Visualized bone marrow signal is within normal limits. Intact visible sacrum and SI joints. Conus medullaris and cauda equina: Conus extends to the T12-L1 level. No lower spinal cord or conus signal abnormality. Paraspinal and other soft tissues: Negative. Disc levels: T12-L1:  Negative. L1-L2:  Negative. L2-L3: Minimal mostly anterior and lateral disc  bulging and endplate spurring appears stable. No stenosis. L3-L4: Minimal disc bulging and facet hypertrophy is stable with no convincing stenosis. L4-L5: Minimal disc bulging. Mild to moderate facet hypertrophy. Stable, with no stenosis. L5-S1: Minimal disc bulging. Stable mild to moderate facet  hypertrophy greater on the right. No convincing stenosis. IMPRESSION: 1. Stable MRI appearance of the lumbar spine since 2018. 2. Levoconvex scoliosis and mild reversal of upper lumbar lordosis with minimal disc and endplate degeneration, mild to moderate lower lumbar facet hypertrophy. No spinal stenosis or convincing neural impingement. Electronically Signed   By: Genevie Ann M.D.   On: 06/03/2019 01:55   I, [name], personally (independently) visualized and performed the interpretation of the images attached in this note.  Diagnostic Limited MSK Ultrasound of: Left shoulder Biceps tendon intact normal-appearing. Subscapularis tendon intact.  Hypoechoic fluid tracking superficial to subscapularis tendon near coracoid possible bursitis. Supraspinatus tendon with thickened increased subacromial bursa.  Possible bursal sided partial rotator cuff tear visible.  No increased change on Doppler imaging. Infraspinatus tendon normal-appearing AC joint with effusion. Impression: Subacromial bursitis versus partial-thickness bursal sided supraspinatus tear.  Procedure: Real-time Ultrasound Guided Injection of left shoulder subacromial bursa Device: Philips Affiniti 50G Images permanently stored and available for review in the ultrasound unit. Verbal informed consent obtained.  Discussed risks and benefits of procedure. Warned about infection bleeding damage to structures skin hypopigmentation and fat atrophy among others. Patient expresses understanding and agreement Time-out conducted.   Noted no overlying erythema, induration, or other signs of local infection.   Skin prepped in a sterile fashion.   Local anesthesia: Topical Ethyl chloride.   With sterile technique and under real time ultrasound guidance:  40 mg of Kenalog and 2 mL of Marcaine injected easily.   Completed without difficulty   Pain immediately resolved suggesting accurate placement of the medication.   Advised to call if  fevers/chills, erythema, induration, drainage, or persistent bleeding.   Images permanently stored and available for review in the ultrasound unit.  Impression: Technically successful ultrasound guided injection.      Assessment and Plan: 34 y.o. male with  Left shoulder pain: Rotator cuff tendinopathy with bursitis.  Possible bursal sided rotator cuff tear.  Plan for injection as above with home exercise program.  Leg pain: Unclear etiology.  No obvious explanation for what I thought was radiculopathy based on lumbar MRI.  Patient has new somewhat dusky feet and toes bilaterally with slight decreased pulses per my exam today.  I think he would benefit strongly from ABI to rule out claudication. Assuming this is test is normal would proceed with EMG and possibly work-up for RSD/complex regional pain syndrome with three-phase bone scan.  Additionally will proceed with rheumatologic work-up.  Patient has diffuse myalgias and arthralgias along with hand swelling.  I am looking for some clear explanation for his overall pain complaint.  It is possible he has multiple different issues however he needs full work-up at this point.   PDMP not reviewed this encounter. Orders Placed This Encounter  Procedures  . [place] LIMITED JOINT SPACE STRUCTURES UP LEFT(NO LINKED CHARGES)    Order Specific Question:   Reason for Exam (SYMPTOM  OR DIAGNOSIS REQUIRED)    Answer:   eval left shoulder pain    Order Specific Question:   Preferred imaging location?    Answer:   [place]  . Comprehensive metabolic panel    Standing Status:   Future    Number of Occurrences:  1    Standing Expiration Date:   06/02/2020  . Sedimentation rate    Standing Status:   Future    Number of Occurrences:   1    Standing Expiration Date:   06/02/2020  . Rheumatoid factor    Standing Status:   Future    Number of Occurrences:   1    Standing Expiration Date:   06/02/2020  . ANA    Standing Status:    Future    Number of Occurrences:   1    Standing Expiration Date:   06/02/2020  . Cyclic citrul peptide antibody, IgG    Standing Status:   Future    Number of Occurrences:   1    Standing Expiration Date:   06/02/2020  . CK    Standing Status:   Future    Number of Occurrences:   1    Standing Expiration Date:   06/02/2020  . Uric acid    Standing Status:   Future    Number of Occurrences:   1    Standing Expiration Date:   06/02/2020  . Complement, total    Standing Status:   Future    Number of Occurrences:   1    Standing Expiration Date:   06/02/2020  . HLA-B27 antigen    Standing Status:   Future    Number of Occurrences:   1    Standing Expiration Date:   06/02/2020  . CBC with Differential/Platelet    Standing Status:   Future    Number of Occurrences:   1    Standing Expiration Date:   08/03/2019  . TSH    Standing Status:   Future    Number of Occurrences:   1    Standing Expiration Date:   08/03/2019  . VITAMIN D 25 Hydroxy (Vit-D Deficiency, Fractures)    Standing Status:   Future    Number of Occurrences:   1    Standing Expiration Date:   08/03/2019  . Vitamin B12    Standing Status:   Future    Number of Occurrences:   1    Standing Expiration Date:   08/03/2019   No orders of the defined types were placed in this encounter.    Discussed warning signs or symptoms. Please see discharge instructions. Patient expresses understanding.   The above documentation has been reviewed and is accurate and complete [name]

## 2019-06-03 NOTE — Telephone Encounter (Signed)
Patient called back following up on this.  Please advise.  

## 2019-06-03 NOTE — Patient Instructions (Signed)
Thank you for coming in today. Plan for labs and blood flow test today or tomorrow.  Call or go to the ER if you develop a large red swollen joint with extreme pain or oozing puss.  Plan to recheck with me later this week Thursday or Friday.

## 2019-06-03 NOTE — Telephone Encounter (Signed)
Critical lab result from today 06/03/2019 WBC= 19.4

## 2019-06-03 NOTE — Progress Notes (Signed)
MRI lumbar spine is stable compared to 2018.  You do have some arthritis in the low back but no severely pinched nerve or significant spinal stenosis.  We will discuss this further at follow-up appointment.Let me know if you have any further questions.

## 2019-06-04 ENCOUNTER — Encounter: Payer: Self-pay | Admitting: Family Medicine

## 2019-06-04 ENCOUNTER — Other Ambulatory Visit: Payer: Self-pay

## 2019-06-04 ENCOUNTER — Telehealth: Payer: Self-pay | Admitting: Family Medicine

## 2019-06-04 ENCOUNTER — Ambulatory Visit (HOSPITAL_COMMUNITY)
Admission: RE | Admit: 2019-06-04 | Discharge: 2019-06-04 | Disposition: A | Discharge status: Home / Self Care | Payer: 59 | Source: Ambulatory Visit | Attending: Vascular Surgery | Admitting: Vascular Surgery

## 2019-06-04 DIAGNOSIS — M79672 Pain in left foot: Secondary | ICD-10-CM

## 2019-06-04 DIAGNOSIS — M2550 Pain in unspecified joint: Secondary | ICD-10-CM

## 2019-06-04 DIAGNOSIS — R5383 Other fatigue: Secondary | ICD-10-CM

## 2019-06-04 DIAGNOSIS — M899 Disorder of bone, unspecified: Secondary | ICD-10-CM

## 2019-06-04 DIAGNOSIS — M25512 Pain in left shoulder: Secondary | ICD-10-CM

## 2019-06-04 DIAGNOSIS — R202 Paresthesia of skin: Secondary | ICD-10-CM | POA: Diagnosis not present

## 2019-06-04 DIAGNOSIS — M25562 Pain in left knee: Secondary | ICD-10-CM

## 2019-06-04 DIAGNOSIS — M791 Myalgia, unspecified site: Secondary | ICD-10-CM

## 2019-06-04 DIAGNOSIS — I10 Essential (primary) hypertension: Secondary | ICD-10-CM | POA: Diagnosis present

## 2019-06-04 DIAGNOSIS — M5416 Radiculopathy, lumbar region: Secondary | ICD-10-CM | POA: Diagnosis present

## 2019-06-04 DIAGNOSIS — R7989 Other specified abnormal findings of blood chemistry: Secondary | ICD-10-CM | POA: Diagnosis present

## 2019-06-04 DIAGNOSIS — M25552 Pain in left hip: Secondary | ICD-10-CM | POA: Diagnosis present

## 2019-06-04 DIAGNOSIS — M949 Disorder of cartilage, unspecified: Secondary | ICD-10-CM | POA: Diagnosis present

## 2019-06-04 MED ORDER — OXYCODONE-ACETAMINOPHEN 10-325 MG PO TABS: 1.0000 | ORAL_TABLET | ORAL | 0 refills | Status: DC | PRN

## 2019-06-04 NOTE — Telephone Encounter (Signed)
I called Ryan [name] back about his labs.  He confirms that he has no obvious source of infection.  He also notes that he has his ABI test scheduled for later today.  He notes that he will run out of his pain medicine soon.  Refilled pain medication await ABI result and subsequent labs.  Will follow up in clinic as scheduled later this week.

## 2019-06-04 NOTE — Progress Notes (Signed)
White blood cell count is elevated at 19.  This could be due to the steroids you were given recently.  The prednisone pills and the steroid injection in the hip are certainly a possibility to cause elevated white blood cell count.  Infection is also a possibility however you do not have a clear source of infection at this point.Uric acid is 3.3 which eliminates gout.CK is normal.  This is a marker for muscle inflammation. Blood sugar is elevated likely from the recent steroid administration.Alkaline phosphatase days AST and ALT are elevated indicating some possible mild liver inflammation. Vitamin D is a bit low.Vitamin B12 is close to low as well.. TSH a thyroid lab is normal.Sedimentation rate is elevated at 74 indicating overall inflammation. Recheck with me as scheduled on the 11th.  Your labs are pending.

## 2019-06-05 ENCOUNTER — Encounter: Payer: Self-pay | Admitting: Family Medicine

## 2019-06-05 ENCOUNTER — Telehealth: Payer: Self-pay | Admitting: Family Medicine

## 2019-06-05 DIAGNOSIS — R0989 Other specified symptoms and signs involving the circulatory and respiratory systems: Secondary | ICD-10-CM | POA: Insufficient documentation

## 2019-06-05 LAB — CYCLIC CITRUL PEPTIDE ANTIBODY, IGG: Cyclic Citrullin Peptide Ab: <16 UNITS

## 2019-06-05 LAB — ANA: Anti Nuclear Antibody (ANA): NEGATIVE

## 2019-06-05 LAB — COMPLEMENT, TOTAL

## 2019-06-05 LAB — RHEUMATOID FACTOR: Rheumatoid fact SerPl-aCnc: <14 IU/mL (ref <14)

## 2019-06-05 LAB — HLA-B27 ANTIGEN: HLA-B27 Antigen: NEGATIVE

## 2019-06-05 MED ORDER — AMLODIPINE BESYLATE 5 MG PO TABS: 5.0000 mg | ORAL_TABLET | Freq: Every day | ORAL | 1 refills | Status: DC

## 2019-06-05 NOTE — Telephone Encounter (Signed)
I called Ryan [name] back. His toe brachial index was low. We will start treatment with amlodipine for possible [place].  Follow up tomorrow

## 2019-06-05 NOTE — Progress Notes (Signed)
Good news blood flow test is normal.  We will discuss this further at follow-up visit this week.

## 2019-06-06 ENCOUNTER — Other Ambulatory Visit (INDEPENDENT_AMBULATORY_CARE_PROVIDER_SITE_OTHER): Payer: 59

## 2019-06-06 ENCOUNTER — Encounter: Payer: Self-pay | Admitting: Family Medicine

## 2019-06-06 ENCOUNTER — Telehealth: Payer: Self-pay | Admitting: Family Medicine

## 2019-06-06 ENCOUNTER — Ambulatory Visit (INDEPENDENT_AMBULATORY_CARE_PROVIDER_SITE_OTHER): Payer: 59

## 2019-06-06 ENCOUNTER — Other Ambulatory Visit: Payer: Self-pay

## 2019-06-06 ENCOUNTER — Ambulatory Visit (INDEPENDENT_AMBULATORY_CARE_PROVIDER_SITE_OTHER): Payer: 59 | Admitting: Family Medicine

## 2019-06-06 VITALS — BP 134/80 | HR 111 | Ht 72.0 in | Wt 219.2 lb

## 2019-06-06 DIAGNOSIS — I776 Arteritis, unspecified: Secondary | ICD-10-CM | POA: Diagnosis not present

## 2019-06-06 DIAGNOSIS — R0989 Other specified symptoms and signs involving the circulatory and respiratory systems: Secondary | ICD-10-CM

## 2019-06-06 DIAGNOSIS — R700 Elevated erythrocyte sedimentation rate: Secondary | ICD-10-CM

## 2019-06-06 DIAGNOSIS — D72829 Elevated white blood cell count, unspecified: Secondary | ICD-10-CM | POA: Diagnosis not present

## 2019-06-06 LAB — URINALYSIS, ROUTINE W REFLEX MICROSCOPIC
Bilirubin Urine: NEGATIVE
Hgb urine dipstick: NEGATIVE
Ketones, ur: NEGATIVE
Leukocytes,Ua: NEGATIVE
Nitrite: NEGATIVE
RBC / HPF: NONE SEEN (ref 0–?)
Specific Gravity, Urine: 1.02 (ref 1.000–1.030)
Total Protein, Urine: NEGATIVE
Urine Glucose: NEGATIVE
Urobilinogen, UA: 0.2 (ref 0.0–1.0)
pH: 7 (ref 5.0–8.0)

## 2019-06-06 LAB — CBC WITH DIFFERENTIAL/PLATELET
Basophils Absolute: 0.2 10*3/uL — ABNORMAL HIGH (ref 0.0–0.1)
Basophils Relative: 1 % (ref 0.0–3.0)
Eosinophils Absolute: 0.4 10*3/uL (ref 0.0–0.7)
Eosinophils Relative: 2.3 % (ref 0.0–5.0)
HCT: 42.4 % (ref 39.0–52.0)
Hemoglobin: 14.8 g/dL (ref 13.0–17.0)
Lymphocytes Relative: 14.4 % (ref 12.0–46.0)
Lymphs Abs: 2.8 10*3/uL (ref 0.7–4.0)
MCHC: 35 g/dL (ref 30.0–36.0)
MCV: 91.5 fl (ref 78.0–100.0)
Monocytes Absolute: 1.7 10*3/uL — ABNORMAL HIGH (ref 0.1–1.0)
Monocytes Relative: 8.7 % (ref 3.0–12.0)
Neutro Abs: 14.1 10*3/uL — ABNORMAL HIGH (ref 1.4–7.7)
Neutrophils Relative %: 73.6 % (ref 43.0–77.0)
Platelets: 360 10*3/uL (ref 150.0–400.0)
RBC: 4.64 Mil/uL (ref 4.22–5.81)
RDW: 12.8 % (ref 11.5–15.5)
WBC: 19.2 10*3/uL (ref 4.0–10.5)

## 2019-06-06 LAB — MICROALBUMIN / CREATININE URINE RATIO
Creatinine,U: 155.2 mg/dL
Microalb Creat Ratio: 2 mg/g (ref 0.0–30.0)
Microalb, Ur: 3.1 mg/dL — ABNORMAL HIGH (ref 0.0–1.9)

## 2019-06-06 LAB — SEDIMENTATION RATE: Sed Rate: 83 mm/hr — ABNORMAL HIGH (ref 0–15)

## 2019-06-06 LAB — C-REACTIVE PROTEIN: CRP: 3.5 mg/dL (ref 0.5–20.0)

## 2019-06-06 MED ORDER — CILOSTAZOL 100 MG PO TABS: 50.0000 mg | ORAL_TABLET | Freq: Two times a day (BID) | ORAL | 1 refills | Status: DC

## 2019-06-06 MED ORDER — OXYCODONE-ACETAMINOPHEN 10-325 MG PO TABS: 1.0000 | ORAL_TABLET | ORAL | 0 refills | Status: DC | PRN

## 2019-06-06 NOTE — Progress Notes (Signed)
I, [name], LAT, ATC, am serving as scribe for Dr. [name].  Farah Benish Delosreyes is a 34 y.o. male who presents to [place] [facility] today for f/u of multiple issues including low back pain, L leg pain, B foot and hand pain and swelling and B shoulder pain.  He was last seen by Dr. Georgina Snell on 06/03/19 and was referred for B ABIs that he had on 06/04/19.  He has an extensive work-up to date including XR of his L-spine, L hip, L knee; an L-spine MRI and B ABIs.  He's had an injection in his L hip on 05/28/19 and an injection of his L shoulder on 06/03/19 Since his last visit, pt reports that his symptoms remain unchanged and is still having pain and swelling in his hands and feet.  In the interim ABI came back normal but toe brachial index came back very low bilaterally.  He notes continued pain and feeling cold in his feet along with generalized body pain myalgias and arthralgias. In response to abnormal toe brachial index amlodipine was prescribed yesterday which she took yesterday.  He cannot tell if it is helping or not yet.  Pertinent review of systems: No fevers or chills.  No cough hemoptysis nosebleeds.  No bloody urine.  Relevant historical information: Hypertension   Exam:  BP 134/80 (BP Location: Left Arm, Patient Position: Sitting, Cuff Size: Large)   Pulse (!) 111   Ht 6' (1.829 m)   Wt 219 lb 3.2 oz (99.4 kg)   SpO2 96%   BMI 29.73 kg/m  General: Well Developed, well nourished, and in no acute distress.  Heart: Regular rate and rhythm no murmurs rubs or gallops.  Heart rate per my check 90 bpm. Lungs clear to auscultation bilaterally.  Normal work of breathing. MSK: Bilateral feet still slightly dusky with decreased capillary refill.  Intact dorsal pedis pulses bilaterally.  Sensation is diminished to light touch bilaterally.    Lab and Radiology Results Results for orders placed or performed in visit on 06/03/19 (from the past 72 hour(s))    Vitamin B12     Status: None   Collection Time: 06/03/19 10:39 AM  Result Value Ref Range   Vitamin B-12 276 211 - 911 pg/mL  VITAMIN D 25 Hydroxy (Vit-D Deficiency, Fractures)     Status: Abnormal   Collection Time: 06/03/19 10:39 AM  Result Value Ref Range   VITD 22.10 (L) 30.00 - 100.00 ng/mL  TSH     Status: None   Collection Time: 06/03/19 10:39 AM  Result Value Ref Range   TSH 0.88 0.35 - 4.50 uIU/mL  CBC with Differential/Platelet     Status: Abnormal   Collection Time: 06/03/19 10:39 AM  Result Value Ref Range   WBC 19.4 Repeated and verified X2. (HH) 4.0 - 10.5 K/uL   RBC 4.92 4.22 - 5.81 Mil/uL   Hemoglobin 15.7 13.0 - 17.0 g/dL   HCT 45.6 39.0 - 52.0 %   MCV 92.7 78.0 - 100.0 fl   MCHC 34.5 30.0 - 36.0 g/dL   RDW 12.7 11.5 - 15.5 %   Platelets 338.0 150.0 - 400.0 K/uL   Neutrophils Relative % 73.6 43.0 - 77.0 %   Lymphocytes Relative 16.6 12.0 - 46.0 %   Monocytes Relative 4.7 3.0 - 12.0 %   Eosinophils Relative 3.9 0.0 - 5.0 %   Basophils Relative 1.2 0.0 - 3.0 %   Neutro Abs 14.3 (H) 1.4 - 7.7  K/uL   Lymphs Abs 3.2 0.7 - 4.0 K/uL   Monocytes Absolute 0.9 0.1 - 1.0 K/uL   Eosinophils Absolute 0.7 0.0 - 0.7 K/uL   Basophils Absolute 0.2 (H) 0.0 - 0.1 K/uL  HLA-B27 antigen     Status: None   Collection Time: 06/03/19 10:39 AM  Result Value Ref Range   HLA-B27 Antigen NEGATIVE NEGATIVE  Complement, total     Status: None   Collection Time: 06/03/19 10:39 AM  Result Value Ref Range   Compl, Total (CH50) CANCELED     Comment: TEST NOT PERFORMED . No suitable specimen received.  Result canceled by the ancillary.   Uric acid     Status: Abnormal   Collection Time: 06/03/19 10:39 AM  Result Value Ref Range   Uric Acid, Serum 3.3 (L) 4.0 - 7.8 mg/dL  CK     Status: None   Collection Time: 06/03/19 10:39 AM  Result Value Ref Range   Total CK 52 7 - 937 U/L  Cyclic citrul peptide antibody, IgG     Status: None   Collection Time: 06/03/19 10:39 AM  Result  Value Ref Range   Cyclic Citrullin Peptide Ab <16 UNITS    Comment: Reference Range Negative:            <20 Weak Positive:       20-39 Moderate Positive:   40-59 Strong Positive:     >59 .   ANA     Status: None   Collection Time: 06/03/19 10:39 AM  Result Value Ref Range   Anti Nuclear Antibody (ANA) NEGATIVE NEGATIVE    Comment: ANA IFA is a first line screen for detecting the presence of up to approximately 150 autoantibodies in various autoimmune diseases. A negative ANA IFA result suggests an ANA-associated autoimmune disease is not present at this time, but is not definitive. If there is high clinical suspicion for Sjogren's syndrome, testing for anti-SS-A/Ro antibody should be considered. Anti-Jo-1 antibody should be considered for clinically suspected inflammatory myopathies. . AC-0: Negative . International Consensus on ANA Patterns (https://www.hernandez-brewer.com/) . For additional information, please refer to http://education.[employer].com/faq/FAQ177 (This link is being provided for informational/ educational purposes only.) .   Rheumatoid factor     Status: None   Collection Time: 06/03/19 10:39 AM  Result Value Ref Range   Rhuematoid fact SerPl-aCnc <14 <14 IU/mL  Sedimentation rate     Status: Abnormal   Collection Time: 06/03/19 10:39 AM  Result Value Ref Range   Sed Rate 74 (H) 0 - 15 mm/hr  Comprehensive metabolic panel     Status: Abnormal   Collection Time: 06/03/19 10:39 AM  Result Value Ref Range   Sodium 134 (L) 135 - 145 mEq/L   Potassium 3.3 (L) 3.5 - 5.1 mEq/L   Chloride 99 96 - 112 mEq/L   CO2 24 19 - 32 mEq/L   Glucose, Bld 181 (H) 70 - 99 mg/dL   BUN 13 6 - 23 mg/dL   Creatinine, Ser 0.66 0.40 - 1.50 mg/dL   Total Bilirubin 0.5 0.2 - 1.2 mg/dL   Alkaline Phosphatase 128 (H) 39 - 117 U/L   AST 44 (H) 0 - 37 U/L   ALT 72 (H) 0 - 53 U/L   Total Protein 8.0 6.0 - 8.3 g/dL   Albumin 4.2 3.5 - 5.2 g/dL   GFR 136.72 >60.00  mL/min   Calcium 9.8 8.4 - 10.5 mg/dL   VAS [place] ABI WITH/WO TBI  Result Date: 06/04/2019  LOWER EXTREMITY DOPPLER STUDY High Risk Factors: Hypertension, past history of smoking. Other Factors: Bilateral foot pain, coldness, "pins and needles" sensation,                History of back surgery.  Performing Technologist: Delorise Shiner RVT  Examination Guidelines: A complete evaluation includes at minimum, Doppler waveform signals and systolic blood pressure reading at the level of bilateral brachial, anterior tibial, and posterior tibial arteries, when vessel segments are accessible. Bilateral testing is considered an integral part of a complete examination. Photoelectric Plethysmograph (PPG) waveforms and toe systolic pressure readings are included as required and additional duplex testing as needed. Limited examinations for reoccurring indications may be performed as noted.  ABI Findings: +---------+------------------+-----+---------+--------+ Right    Rt Pressure (mmHg)IndexWaveform Comment  +---------+------------------+-----+---------+--------+ Brachial 153                                      +---------+------------------+-----+---------+--------+ ATA      188               1.22                   +---------+------------------+-----+---------+--------+ PTA      184               1.19 triphasic         +---------+------------------+-----+---------+--------+ DP                              triphasic         +---------+------------------+-----+---------+--------+ Great Toe0                 0.00                   +---------+------------------+-----+---------+--------+ +---------+------------------+-----+--------+-------+ Left     Lt Pressure (mmHg)IndexWaveformComment +---------+------------------+-----+--------+-------+ Brachial 154                                    +---------+------------------+-----+--------+-------+ ATA      185               1.20                  +---------+------------------+-----+--------+-------+ PTA      188               1.22                 +---------+------------------+-----+--------+-------+ Great Toe0                 0.00                 +---------+------------------+-----+--------+-------+ +-------+-----------+-----------+------------+------------+ ABI/TBIToday's ABIToday's TBIPrevious ABIPrevious TBI +-------+-----------+-----------+------------+------------+ Right  1.22       0                                   +-------+-----------+-----------+------------+------------+ Left   1.22       0                                   +-------+-----------+-----------+------------+------------+ TOES Findings: +----------+---------------+--------+-------+ Right ToesPressure (mmHg)WaveformComment +----------+---------------+--------+-------+ 1st Digit  Absent          +----------+---------------+--------+-------+ 2nd Digit                Absent          +----------+---------------+--------+-------+ 3rd Digit                Absent          +----------+---------------+--------+-------+ 4th Digit                Absent          +----------+---------------+--------+-------+ 5th Digit                Absent          +----------+---------------+--------+-------+  +---------+---------------+--------+-------+ Left ToesPressure (mmHg)WaveformComment +---------+---------------+--------+-------+ 1st Digit               Absent          +---------+---------------+--------+-------+ 2nd Digit               Absent          +---------+---------------+--------+-------+ 3rd Digit               Absent          +---------+---------------+--------+-------+ 4th Digit               Absent          +---------+---------------+--------+-------+ 5th Digit               Absent          +---------+---------------+--------+-------+   Digital ppg sensors were reapplied after wrapping  the feet with warm blankets for three minutes. No signal was detected post warming.  Summary: Right: Resting right ankle-brachial index is within normal range. No evidence of significant right lower extremity arterial disease. The right toe-brachial index is abnormal. RT great toe pressure = 0 mmHg. Left: Resting left ankle-brachial index is within normal range. No evidence of significant left lower extremity arterial disease. The left toe-brachial index is abnormal. LT Great toe pressure = 0 mmHg.  *See table(s) above for measurements and observations.  Electronically signed by Curt Jews MD on 06/04/2019 at 3:41:05 PM.    Final    [place] LIMITED JOINT SPACE STRUCTURES UP LEFT(NO LINKED CHARGES)  Result Date: 06/05/2019 Diagnostic Limited MSK Ultrasound of: Left shoulder Biceps tendon intact normal-appearing. Subscapularis tendon intact.  Hypoechoic fluid tracking superficial to subscapularis tendon near coracoid possible bursitis. Supraspinatus tendon with thickened increased subacromial bursa.  Possible bursal sided partial rotator cuff tear visible.  No increased change on Doppler imaging. Infraspinatus tendon normal-appearing AC joint with effusion. Impression: Subacromial bursitis versus partial-thickness bursal sided supraspinatus tear.  Procedure: Real-time Ultrasound Guided Injection of left shoulder subacromial bursa Device: Philips Affiniti 50G Images permanently stored and available for review in the ultrasound unit. Verbal informed consent obtained. Discussed risks and benefits of procedure. Warned about infection bleeding damage to structures skin hypopigmentation and fat atrophy among others. Patient expresses understanding and agreement Time-out conducted.  Noted no overlying erythema, induration, or other signs of local infection.  Skin prepped in a sterile fashion.  Local anesthesia: Topical Ethyl chloride.  With sterile technique and under real time ultrasound guidance: 40 mg of Kenalog and 2  mL of Marcaine injected easily.  Completed without difficulty  Pain immediately resolved suggesting accurate placement of the medication.  Advised to call if fevers/chills, erythema, induration, drainage, or persistent bleeding.  Images permanently stored and available for review in the ultrasound unit. Impression:  Technically successful ultrasound guided injection.       Assessment and Plan: 34 y.o. male with  Generalized myalgias and arthralgias with evidence of decreased peripheral perfusion and neuropathy.  Sed rate is significantly elevated as his white blood cell count. Overall picture is concerning for vasculitis or other autoimmune inflammatory condition.  Infection is less likely as he does not have an obvious source of infection at this time.  Additionally he does not have a fever. However something is clearly wrong here.  Plan to expand and confirm rheumatologic work-up.  Will obtain labs listed below including repeat CBC with differential repeat sed rate and add CRP.  We will also check urinalysis and urine microalbumin for signs of nephropathy.  We will proceed with further work-up including hepatitis serologies, ANCA antiphospholipid antibody testing double-stranded DNA complement etc.  Medications for pain refill with plan to refill oxycodone as early as Saturday. We will also add Pletal to hopefully improve perfusion. Plan for recheck on Tuesday of next week (March 16).  Plan may adjust based on lab results.   PDMP not reviewed this encounter. Orders Placed This Encounter  Procedures  . DG Chest 2 View    Standing Status:   Future    Number of Occurrences:   1    Standing Expiration Date:   08/05/2020    Order Specific Question:   Reason for Exam (SYMPTOM  OR DIAGNOSIS REQUIRED)    Answer:   eval elevated WBC    Order Specific Question:   Preferred imaging location?    Answer:   [facility]    Order Specific Question:   Radiology Contrast Protocol - do NOT  remove file path    Answer:   [email or web address]  . CBC with Differential/Platelet    Standing Status:   Future    Standing Expiration Date:   09/06/2019  . Sedimentation rate    Standing Status:   Future    Standing Expiration Date:   09/06/2019  . C-reactive protein    Standing Status:   Future    Standing Expiration Date:   09/06/2019  . Angiotensin converting enzyme  . Anti-DNA antibody, double-stranded  . Antiphospholipid Syndrome Diagnostic Panel  . Complement, total  . ANCA TITERS  . Hepatitis C antibody    Standing Status:   Future    Standing Expiration Date:   09/06/2019  . Pathologist smear review    Standing Status:   Future    Standing Expiration Date:   09/06/2019  . Hepatitis B surface antibody,quantitative    Standing Status:   Future    Standing Expiration Date:   09/06/2019  . Hepatitis B surface antigen    Standing Status:   Future    Standing Expiration Date:   09/06/2019  . Urinalysis, Routine w reflex microscopic    Standing Status:   Future    Standing Expiration Date:   06/05/2020  . Urine Microalbumin w/creat. ratio    Standing Status:   Future    Standing Expiration Date:   06/05/2020   Meds ordered this encounter  Medications  . cilostazol (PLETAL) 100 MG tablet    Sig: Take 0.5 tablets (50 mg total) by mouth 2 (two) times daily.    Dispense:  60 tablet    Refill:  1  . oxyCODONE-acetaminophen (PERCOCET) 10-325 MG tablet    Sig: Take 1 tablet by mouth every 4 (four) hours as needed for pain.    Dispense:  35 tablet  Refill:  0    May fill on Sat 13th. Pain is becoming chronic     Discussed warning signs or symptoms. Please see discharge instructions. Patient expresses understanding.   The above documentation has been reviewed and is accurate and complete [name]

## 2019-06-06 NOTE — Progress Notes (Signed)
Other rheumatologic labs are negative

## 2019-06-06 NOTE — Patient Instructions (Addendum)
Thank you for coming in today. Get xray today.  Continue amlodipine.  Start pletal to make the red blood cells flow more easily through a narrowed blood vessel.  Get labs at [facility].  Schedule follow up with me Tuesday or so. 30 min appt.   Cell phone is [phone]

## 2019-06-06 NOTE — Telephone Encounter (Signed)
Noted  

## 2019-06-06 NOTE — Telephone Encounter (Signed)
Critical lab result today 06/06/2019 WBC 19.2 per [facility] (CBC not fully completed yet)

## 2019-06-06 NOTE — Progress Notes (Signed)
White blood cell count continues to be elevated slightly less than it was 3 days ago.  Sedimentation rate is more elevated than it was a few days ago.  CRP is not significantly elevated.  Trace amounts of protein are in the urine otherwise urine test looks normal.  Other labs are still pending

## 2019-06-06 NOTE — Progress Notes (Signed)
Chest x-ray normal

## 2019-06-07 ENCOUNTER — Encounter: Payer: Self-pay | Admitting: Family Medicine

## 2019-06-07 LAB — HEPATITIS C ANTIBODY
Hepatitis C Ab: NONREACTIVE
SIGNAL TO CUT-OFF: 0.01 (ref <1.00)

## 2019-06-07 LAB — HEPATITIS B SURFACE ANTIGEN: Hepatitis B Surface Ag: NONREACTIVE

## 2019-06-07 LAB — PATHOLOGIST SMEAR REVIEW

## 2019-06-07 LAB — HEPATITIS B SURFACE ANTIBODY, QUANTITATIVE: Hep B S AB Quant (Post): <5 m[IU]/mL — ABNORMAL LOW (ref >=10)

## 2019-06-07 NOTE — Telephone Encounter (Signed)
Patient called regarding this issue.  Please advise.

## 2019-06-07 NOTE — Telephone Encounter (Signed)
I have taken care of this

## 2019-06-07 NOTE — Progress Notes (Signed)
Hepatitis labs are negative meaning you do not have hepatitis.  Sometimes hepatitis is can cause these symptoms as well.

## 2019-06-09 ENCOUNTER — Encounter: Payer: Self-pay | Admitting: Family Medicine

## 2019-06-10 ENCOUNTER — Encounter: Payer: Self-pay | Admitting: Family Medicine

## 2019-06-10 ENCOUNTER — Ambulatory Visit (HOSPITAL_COMMUNITY): Admission: RE | Admit: 2019-06-10 | Payer: 59 | Source: Ambulatory Visit

## 2019-06-10 ENCOUNTER — Telehealth: Payer: Self-pay | Admitting: Family Medicine

## 2019-06-10 ENCOUNTER — Telehealth: Payer: Self-pay | Admitting: Hematology

## 2019-06-10 DIAGNOSIS — D72829 Elevated white blood cell count, unspecified: Secondary | ICD-10-CM

## 2019-06-10 NOTE — Telephone Encounter (Signed)
Referral to hematology oncology as we discussed.

## 2019-06-10 NOTE — Telephone Encounter (Signed)
Scheduled per referral. Called and spoke with pt, confirmed 4/8 appt. Pt made aware to arrive 15-30 mins early and to bring insurance card with photo ID

## 2019-06-10 NOTE — Telephone Encounter (Signed)
Patient called stating that he contacted the ED. They are full and have no room so he does not want to go. He said that his hands are starting to turn dark but he is planning to be here in the morning for the appointment with you.

## 2019-06-10 NOTE — Progress Notes (Signed)
Peripheral smear shows elevated white blood cells.  Probably not related to anything particularly dangerous however will refer to heme-onc as we discussed on the phone.

## 2019-06-11 ENCOUNTER — Ambulatory Visit (INDEPENDENT_AMBULATORY_CARE_PROVIDER_SITE_OTHER): Payer: 59 | Admitting: Family Medicine

## 2019-06-11 ENCOUNTER — Emergency Department (HOSPITAL_COMMUNITY)
Admission: EM | Admit: 2019-06-11 | Discharge: 2019-06-11 | Disposition: A | Discharge status: Home / Self Care | Payer: 59 | Attending: Emergency Medicine | Admitting: Emergency Medicine

## 2019-06-11 ENCOUNTER — Other Ambulatory Visit: Payer: Self-pay

## 2019-06-11 ENCOUNTER — Encounter: Payer: Self-pay | Admitting: Family Medicine

## 2019-06-11 ENCOUNTER — Encounter (HOSPITAL_COMMUNITY): Payer: Self-pay | Admitting: Emergency Medicine

## 2019-06-11 VITALS — BP 140/80 | Ht 72.0 in | Wt 223.4 lb

## 2019-06-11 DIAGNOSIS — D72829 Elevated white blood cell count, unspecified: Secondary | ICD-10-CM | POA: Diagnosis not present

## 2019-06-11 DIAGNOSIS — R0989 Other specified symptoms and signs involving the circulatory and respiratory systems: Secondary | ICD-10-CM | POA: Diagnosis not present

## 2019-06-11 DIAGNOSIS — M79642 Pain in left hand: Secondary | ICD-10-CM | POA: Insufficient documentation

## 2019-06-11 DIAGNOSIS — M79671 Pain in right foot: Secondary | ICD-10-CM | POA: Insufficient documentation

## 2019-06-11 DIAGNOSIS — M79643 Pain in unspecified hand: Secondary | ICD-10-CM

## 2019-06-11 DIAGNOSIS — M79672 Pain in left foot: Secondary | ICD-10-CM | POA: Insufficient documentation

## 2019-06-11 DIAGNOSIS — R700 Elevated erythrocyte sedimentation rate: Secondary | ICD-10-CM

## 2019-06-11 DIAGNOSIS — I10 Essential (primary) hypertension: Secondary | ICD-10-CM | POA: Diagnosis not present

## 2019-06-11 DIAGNOSIS — I776 Arteritis, unspecified: Secondary | ICD-10-CM

## 2019-06-11 DIAGNOSIS — G629 Polyneuropathy, unspecified: Secondary | ICD-10-CM

## 2019-06-11 DIAGNOSIS — M79641 Pain in right hand: Secondary | ICD-10-CM | POA: Insufficient documentation

## 2019-06-11 DIAGNOSIS — Z20822 Contact with and (suspected) exposure to covid-19: Secondary | ICD-10-CM | POA: Diagnosis not present

## 2019-06-11 DIAGNOSIS — Z79899 Other long term (current) drug therapy: Secondary | ICD-10-CM | POA: Insufficient documentation

## 2019-06-11 DIAGNOSIS — Z87891 Personal history of nicotine dependence: Secondary | ICD-10-CM | POA: Diagnosis not present

## 2019-06-11 DIAGNOSIS — M79673 Pain in unspecified foot: Secondary | ICD-10-CM

## 2019-06-11 LAB — CBC WITH DIFFERENTIAL/PLATELET
Abs Immature Granulocytes: 0.14 10*3/uL — ABNORMAL HIGH (ref 0.00–0.07)
Basophils Absolute: 0.1 10*3/uL (ref 0.0–0.1)
Basophils Relative: 1 %
Eosinophils Absolute: 0 10*3/uL (ref 0.0–0.5)
Eosinophils Relative: 0 %
HCT: 40.9 % (ref 39.0–52.0)
Hemoglobin: 14 g/dL (ref 13.0–17.0)
Immature Granulocytes: 1 %
Lymphocytes Relative: 20 %
Lymphs Abs: 3.1 10*3/uL (ref 0.7–4.0)
MCH: 31.3 pg (ref 26.0–34.0)
MCHC: 34.2 g/dL (ref 30.0–36.0)
MCV: 91.5 fL (ref 80.0–100.0)
Monocytes Absolute: 0.8 10*3/uL (ref 0.1–1.0)
Monocytes Relative: 5 %
Neutro Abs: 11.6 10*3/uL — ABNORMAL HIGH (ref 1.7–7.7)
Neutrophils Relative %: 73 %
Platelets: 389 10*3/uL (ref 150–400)
RBC: 4.47 MIL/uL (ref 4.22–5.81)
RDW: 11.9 % (ref 11.5–15.5)
WBC: 15.7 10*3/uL — ABNORMAL HIGH (ref 4.0–10.5)
nRBC: 0 % (ref 0.0–0.2)

## 2019-06-11 LAB — URINALYSIS, ROUTINE W REFLEX MICROSCOPIC
Bilirubin Urine: NEGATIVE
Glucose, UA: NEGATIVE mg/dL
Hgb urine dipstick: NEGATIVE
Ketones, ur: NEGATIVE mg/dL
Leukocytes,Ua: NEGATIVE
Nitrite: NEGATIVE
Protein, ur: NEGATIVE mg/dL
Specific Gravity, Urine: 1.01 (ref 1.005–1.030)
pH: 7 (ref 5.0–8.0)

## 2019-06-11 LAB — PROTEIN / CREATININE RATIO, URINE
Creatinine, Urine: 44.53 mg/dL
Total Protein, Urine: <6 mg/dL

## 2019-06-11 LAB — BASIC METABOLIC PANEL
Anion gap: 13 (ref 5–15)
BUN: 9 mg/dL (ref 6–20)
CO2: 24 mmol/L (ref 22–32)
Calcium: 9.4 mg/dL (ref 8.9–10.3)
Chloride: 98 mmol/L (ref 98–111)
Creatinine, Ser: 0.64 mg/dL (ref 0.61–1.24)
GFR calc Af Amer: >60 mL/min (ref >60)
GFR calc non Af Amer: >60 mL/min (ref >60)
Glucose, Bld: 170 mg/dL — ABNORMAL HIGH (ref 70–99)
Potassium: 3.1 mmol/L — ABNORMAL LOW (ref 3.5–5.1)
Sodium: 135 mmol/L (ref 135–145)

## 2019-06-11 LAB — HEPATIC FUNCTION PANEL
ALT: 57 U/L — ABNORMAL HIGH (ref 0–44)
AST: 32 U/L (ref 15–41)
Albumin: 3.6 g/dL (ref 3.5–5.0)
Alkaline Phosphatase: 118 U/L (ref 38–126)
Bilirubin, Direct: <0.1 mg/dL (ref 0.0–0.2)
Total Bilirubin: 0.5 mg/dL (ref 0.3–1.2)
Total Protein: 6.9 g/dL (ref 6.5–8.1)

## 2019-06-11 LAB — CK: Total CK: 72 U/L (ref 49–397)

## 2019-06-11 LAB — RAPID URINE DRUG SCREEN, HOSP PERFORMED
Amphetamines: NOT DETECTED
Barbiturates: NOT DETECTED
Benzodiazepines: NOT DETECTED
Cocaine: NOT DETECTED
Opiates: POSITIVE — AB
Tetrahydrocannabinol: NOT DETECTED

## 2019-06-11 LAB — RESPIRATORY PANEL BY RT PCR (FLU A&B, COVID)
Influenza A by PCR: NEGATIVE
Influenza B by PCR: NEGATIVE
SARS Coronavirus 2 by RT PCR: NEGATIVE

## 2019-06-11 LAB — SARS CORONAVIRUS 2 (TAT 6-24 HRS): SARS Coronavirus 2: NEGATIVE

## 2019-06-11 LAB — SEDIMENTATION RATE: Sed Rate: 48 mm/hr — ABNORMAL HIGH (ref 0–16)

## 2019-06-11 LAB — C-REACTIVE PROTEIN: CRP: 2.7 mg/dL — ABNORMAL HIGH (ref <1.0)

## 2019-06-11 MED ORDER — MORPHINE SULFATE (PF) 4 MG/ML IV SOLN
4.0000 mg | Freq: Once | INTRAVENOUS | Status: AC
  Administered 2019-06-11: 4 mg via INTRAVENOUS
  Filled 2019-06-11: qty 1

## 2019-06-11 MED ORDER — OXYCODONE-ACETAMINOPHEN 10-325 MG PO TABS: 1.0000 | ORAL_TABLET | ORAL | 0 refills | Status: DC | PRN

## 2019-06-11 MED ORDER — ONDANSETRON HCL 4 MG/2ML IJ SOLN
4.0000 mg | Freq: Once | INTRAMUSCULAR | Status: AC
  Administered 2019-06-11: 4 mg via INTRAVENOUS
  Filled 2019-06-11: qty 2

## 2019-06-11 MED ORDER — HYDROCODONE-ACETAMINOPHEN 5-325 MG PO TABS
2.0000 | ORAL_TABLET | Freq: Once | ORAL | Status: DC
  Filled 2019-06-11: qty 2

## 2019-06-11 NOTE — ED Notes (Signed)
DC papers taken to patient.  Continues to refuse to sign discharge or AMA.  IV removed. Patient calling ride.  Refused ordered pain medications.

## 2019-06-11 NOTE — ED Notes (Signed)
MD into speak with patient.  MD going to give patient discharge papers for further instructions in case of need for return.

## 2019-06-11 NOTE — ED Notes (Signed)
Spoke to patient about signing AMA form.  " I am not signing anything before I leave here."

## 2019-06-11 NOTE — ED Notes (Signed)
Spoke to patient and father, informed of holding until bed is available.  Patient does not want to stay and wants to drive himself to [facility]. MD notified.

## 2019-06-11 NOTE — ED Provider Notes (Signed)
Patient was accepted at change of shift, he has been accepted at [facility] by Dr. Sissy Hoff -there are no current beds available.  The patient is now refusing to go to that facility stating that his insurance will not be covered and he does not want to go.  He feels as though he can be treated more quickly if he leaves himself and goes to another hospital such as [facility].  I offered to call the other hospital to arrange transfer for him to that hospital but he has refused and states that he would like to go home.  I had a discussion with the patient who is upset and tearful about his lack of pain control, lack of being able to get a bed at an appropriate facility and at this time would rather just go home.  He is aware that he may return should he change his mind.  The patient is speaking in full sentences with a normal respiratory pattern, he is not tachycardic or febrile, he has not short of breath.   Ryan Chapel, MD 06/11/19 2103

## 2019-06-11 NOTE — Progress Notes (Signed)
I, [name], LAT, ATC, am serving as scribe for Dr. [name].  Ryan [name] is a 34 y.o. male who presents to [place] [facility] today for f/u of polyarthralgia to include L hip and leg pain; B foot pain and swelling: B hand pain and swelling and B shoulder pain.  He was last seen by Dr. Georgina Snell on 06/06/19.  Since his last visit, pt reports that he did not get any sleep last night due to sharp, stabbing pains in both his feet and hands and coldness/discoloration in both his hands/feet, particularly in his fingers and toes.  He has worsened since his last visit.  Overall he denies any fevers or chills but feels poorly.   Pertinent review of systems: No fevers or chills  Relevant historical information: Hypertension.   Exam:  BP 140/80 (BP Location: Left Arm, Patient Position: Sitting, Cuff Size: Large)   Ht 6' (1.829 m)   Wt 223 lb 6.4 oz (101.3 kg)   BMI 30.30 kg/m  General: Well Developed, well nourished, and in no acute distress.   MSK:  Significant dusky appearance to fingers throughout both upper extremities as well as toes. Cold to touch.  Intact sensation. Unable to get any pulse oximetry from fingers or toes.    Lab and Radiology Results  Recent Results (from the past 2160 hour(s))  Vitamin B12     Status: None   Collection Time: 06/03/19 10:39 AM  Result Value Ref Range   Vitamin B-12 276 211 - 911 pg/mL  VITAMIN D 25 Hydroxy (Vit-D Deficiency, Fractures)     Status: Abnormal   Collection Time: 06/03/19 10:39 AM  Result Value Ref Range   VITD 22.10 (L) 30.00 - 100.00 ng/mL  TSH     Status: None   Collection Time: 06/03/19 10:39 AM  Result Value Ref Range   TSH 0.88 0.35 - 4.50 uIU/mL  CBC with Differential/Platelet     Status: Abnormal   Collection Time: 06/03/19 10:39 AM  Result Value Ref Range   WBC 19.4 Repeated and verified X2. (HH) 4.0 - 10.5 K/uL   RBC 4.92 4.22 - 5.81 Mil/uL   Hemoglobin 15.7 13.0 - 17.0 g/dL    HCT 45.6 39.0 - 52.0 %   MCV 92.7 78.0 - 100.0 fl   MCHC 34.5 30.0 - 36.0 g/dL   RDW 12.7 11.5 - 15.5 %   Platelets 338.0 150.0 - 400.0 K/uL   Neutrophils Relative % 73.6 43.0 - 77.0 %   Lymphocytes Relative 16.6 12.0 - 46.0 %   Monocytes Relative 4.7 3.0 - 12.0 %   Eosinophils Relative 3.9 0.0 - 5.0 %   Basophils Relative 1.2 0.0 - 3.0 %   Neutro Abs 14.3 (H) 1.4 - 7.7 K/uL   Lymphs Abs 3.2 0.7 - 4.0 K/uL   Monocytes Absolute 0.9 0.1 - 1.0 K/uL   Eosinophils Absolute 0.7 0.0 - 0.7 K/uL   Basophils Absolute 0.2 (H) 0.0 - 0.1 K/uL  HLA-B27 antigen     Status: None   Collection Time: 06/03/19 10:39 AM  Result Value Ref Range   HLA-B27 Antigen NEGATIVE NEGATIVE  Complement, total     Status: None   Collection Time: 06/03/19 10:39 AM  Result Value Ref Range   Compl, Total (CH50) CANCELED     Comment: TEST NOT PERFORMED . No suitable specimen received.  Result canceled by the ancillary.   Uric acid     Status: Abnormal   Collection  Time: 06/03/19 10:39 AM  Result Value Ref Range   Uric Acid, Serum 3.3 (L) 4.0 - 7.8 mg/dL  CK     Status: None   Collection Time: 06/03/19 10:39 AM  Result Value Ref Range   Total CK 52 7 - 580 U/L  Cyclic citrul peptide antibody, IgG     Status: None   Collection Time: 06/03/19 10:39 AM  Result Value Ref Range   Cyclic Citrullin Peptide Ab <16 UNITS    Comment: Reference Range Negative:            <20 Weak Positive:       20-39 Moderate Positive:   40-59 Strong Positive:     >59 .   ANA     Status: None   Collection Time: 06/03/19 10:39 AM  Result Value Ref Range   Anti Nuclear Antibody (ANA) NEGATIVE NEGATIVE    Comment: ANA IFA is a first line screen for detecting the presence of up to approximately 150 autoantibodies in various autoimmune diseases. A negative ANA IFA result suggests an ANA-associated autoimmune disease is not present at this time, but is not definitive. If there is high clinical suspicion for Sjogren's  syndrome, testing for anti-SS-A/Ro antibody should be considered. Anti-Jo-1 antibody should be considered for clinically suspected inflammatory myopathies. . AC-0: Negative . International Consensus on ANA Patterns (https://www.hernandez-brewer.com/) . For additional information, please refer to http://education.[facility].com/faq/FAQ177 (This link is being provided for informational/ educational purposes only.) .   Rheumatoid factor     Status: None   Collection Time: 06/03/19 10:39 AM  Result Value Ref Range   Rhuematoid fact SerPl-aCnc <14 <14 IU/mL  Sedimentation rate     Status: Abnormal   Collection Time: 06/03/19 10:39 AM  Result Value Ref Range   Sed Rate 74 (H) 0 - 15 mm/hr  Comprehensive metabolic panel     Status: Abnormal   Collection Time: 06/03/19 10:39 AM  Result Value Ref Range   Sodium 134 (L) 135 - 145 mEq/L   Potassium 3.3 (L) 3.5 - 5.1 mEq/L   Chloride 99 96 - 112 mEq/L   CO2 24 19 - 32 mEq/L   Glucose, Bld 181 (H) 70 - 99 mg/dL   BUN 13 6 - 23 mg/dL   Creatinine, Ser 0.66 0.40 - 1.50 mg/dL   Total Bilirubin 0.5 0.2 - 1.2 mg/dL   Alkaline Phosphatase 128 (H) 39 - 117 U/L   AST 44 (H) 0 - 37 U/L   ALT 72 (H) 0 - 53 U/L   Total Protein 8.0 6.0 - 8.3 g/dL   Albumin 4.2 3.5 - 5.2 g/dL   GFR 136.72 >60.00 mL/min   Calcium 9.8 8.4 - 10.5 mg/dL  Hepatitis C antibody     Status: None   Collection Time: 06/06/19  9:24 AM  Result Value Ref Range   Hepatitis C Ab NON-REACTIVE NON-REACTI   SIGNAL TO CUT-OFF 0.01 <1.00    Comment: . HCV antibody was non-reactive. There is no laboratory  evidence of HCV infection. . In most cases, no further action is required. However, if recent HCV exposure is suspected, a test for HCV RNA (test code [phone]) is suggested. . For additional information please refer to http://education.[facility].com/faq/FAQ22v1 (This link is being provided for informational/ educational purposes only.) .   Pathologist  smear review     Status: None   Collection Time: 06/06/19  9:24 AM  Result Value Ref Range   Path Review      Comment: Leukocytosis  due to absolute granulocytosis. Absolute eosinophilia, the most common associations being allergic disorders, certain parasitic infections and Hodgkin's Disease. Myeloid population consists predominantly of mature segmented neutrophils with reactive changes. No immature cells are identified. RBC are unremarkable. Platelet clumps noted on smear-count appears increased. Reviewed by Francis Gaines Mammarappallil, MD  (Electronic Signature on File)     06/07/2019   Hepatitis B surface antigen     Status: None   Collection Time: 06/06/19  9:24 AM  Result Value Ref Range   Hepatitis B Surface Ag NON-REACTIVE NON-REACTI  Urine Microalbumin w/creat. ratio     Status: Abnormal   Collection Time: 06/06/19  9:24 AM  Result Value Ref Range   Microalb, Ur 3.1 (H) 0.0 - 1.9 mg/dL   Creatinine,U 155.2 mg/dL   Microalb Creat Ratio 2.0 0.0 - 30.0 mg/g  Urinalysis, Routine w reflex microscopic     Status: Abnormal   Collection Time: 06/06/19  9:24 AM  Result Value Ref Range   Color, Urine YELLOW Yellow;Lt. Yellow;Straw;Dark Yellow;Amber;Green;Red;Brown   APPearance CLEAR Clear;Turbid;Slightly Cloudy;Cloudy   Specific Gravity, Urine 1.020 1.000 - 1.030   pH 7.0 5.0 - 8.0   Total Protein, Urine NEGATIVE Negative   Urine Glucose NEGATIVE Negative   Ketones, ur NEGATIVE Negative   Bilirubin Urine NEGATIVE Negative   Hgb urine dipstick NEGATIVE Negative   Urobilinogen, UA 0.2 0.0 - 1.0   Leukocytes,Ua NEGATIVE Negative   Nitrite NEGATIVE Negative   WBC, UA 0-2/hpf 0-2/hpf   RBC / HPF none seen 0-2/hpf   Mucus, UA Presence of (A) None   Squamous Epithelial / LPF Rare(0-4/hpf) Rare(0-4/hpf)  Hepatitis B surface antibody,quantitative     Status: Abnormal   Collection Time: 06/06/19  9:24 AM  Result Value Ref Range   Hepatitis B-Post <5 (L) > OR = 10 mIU/mL    Comment:  . Patient does not have immunity to hepatitis B virus. . For additional information, please refer to http://education.[facility].com/faq/FAQ105 (This link is being provided for informational/ educational purposes only).   C-reactive protein     Status: None   Collection Time: 06/06/19  9:24 AM  Result Value Ref Range   CRP 3.5 0.5 - 20.0 mg/dL  Sedimentation rate     Status: Abnormal   Collection Time: 06/06/19  9:24 AM  Result Value Ref Range   Sed Rate 83 (H) 0 - 15 mm/hr  CBC with Differential/Platelet     Status: Abnormal   Collection Time: 06/06/19  9:24 AM  Result Value Ref Range   WBC 19.2 Repeated and verified X2. (HH) 4.0 - 10.5 K/uL   RBC 4.64 4.22 - 5.81 Mil/uL   Hemoglobin 14.8 13.0 - 17.0 g/dL   HCT 42.4 39.0 - 52.0 %   MCV 91.5 78.0 - 100.0 fl   MCHC 35.0 30.0 - 36.0 g/dL   RDW 12.8 11.5 - 15.5 %   Platelets 360.0 150.0 - 400.0 K/uL   Neutrophils Relative % 73.6 43.0 - 77.0 %   Lymphocytes Relative 14.4 12.0 - 46.0 %   Monocytes Relative 8.7 3.0 - 12.0 %   Eosinophils Relative 2.3 0.0 - 5.0 %   Basophils Relative 1.0 0.0 - 3.0 %   Neutro Abs 14.1 (H) 1.4 - 7.7 K/uL   Lymphs Abs 2.8 0.7 - 4.0 K/uL   Monocytes Absolute 1.7 (H) 0.1 - 1.0 K/uL   Eosinophils Absolute 0.4 0.0 - 0.7 K/uL   Basophils Absolute 0.2 (H) 0.0 - 0.1 K/uL    ABI and  TBI 06/04/19  Digital ppg sensors were reapplied after wrapping the feet with warm  blankets for three minutes. No signal was detected post warming.    Summary:  Right: Resting right ankle-brachial index is within normal range. No  evidence of significant right lower extremity arterial disease. The right  toe-brachial index is abnormal. RT great toe pressure = 0 mmHg.   Left: Resting left ankle-brachial index is within normal range. No  evidence of significant left lower extremity arterial disease. The left  toe-brachial index is abnormal. LT Great toe pressure = 0 mmHg.    Assessment and Plan: 34 y.o. male with   Digit threatening ischemia presumed to be due to vasculitis unknown origin. Patient has had worsening systemic arthralgias and myalgias over the last several weeks.  He has had some initial rheumatologic work-up that is significant for elevated white blood cell count without presumed source of infection as well as elevated sed rate.  Arterial brachial index testing performed last week shows normal ABI but significantly decreased toe brachial index bilaterally.  He has failed initial outpatient management for this issue with trial of amlodipine and Pletal which she was unable to tolerate.  Given his worsening symptoms and now worsening appearance of his digits we discussed options and best bet is admission to the hospital.  [facility] contacted [facility] for direct admit however there are no beds available and they recommend transfer to ER for further evaluation and management and possibly admission. I contacted the triage nurse at [facility] emergency room this morning who is aware of the case.  Plan to go to ER via private vehicle. I am reachable via epic direct messenger today or via my cell phone at [phone].  Please text prior to call so that I know it is coming from a provider in the ER or inpatient setting. We will recheck following hospitalization or ER discharge.  Recommendations: Repeat CBC and sed rate to confirm white cell count not worsening. Confirm poor peripheral perfusion. Initiate treatment for vasculitis or raynaud possibly with IV nitroglycerin.  Consult with appropriate specialist. Schedule follow up with Me and PCP.   We will also place referral to rheumatology for the near future which I think will be helpful. Limited refill of oxycodone to fill if discharged from ED.   PDMP not reviewed this encounter. Orders Placed This Encounter  Procedures  . Ambulatory referral to Rheumatology    Referral Priority:   Urgent    Referral Type:   Consultation    Referral  Reason:   Specialty Services Required    Requested Specialty:   Rheumatology    Number of Visits Requested:   1   Meds ordered this encounter  Medications  . oxyCODONE-acetaminophen (PERCOCET) 10-325 MG tablet    Sig: Take 1 tablet by mouth every 4 (four) hours as needed for pain.    Dispense:  35 tablet    Refill:  0    May fill on Sat 13th. Pain is becoming chronic     Discussed warning signs or symptoms. Please see discharge instructions. Patient expresses understanding.   The above documentation has been reviewed and is accurate and complete [name]

## 2019-06-11 NOTE — ED Triage Notes (Addendum)
Pt arrives from PCP who called ahead to make this RN aware that he is concerned for possible vasculitis/other vascular issues due to purple discoloration of fingers and toes. Patient has been having vascular issues that his PCP has been treating outpatient but symptoms have progressed. Pt also endorses numbness/tingling and sharp pain to digits. Peripheral pulses intact.
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
## 2019-06-11 NOTE — ED Notes (Signed)
[phone] pts father Tommie Raymond, please update

## 2019-06-11 NOTE — Patient Instructions (Signed)
Thank you for coming in today.  Please go directly to the [facility] ER.  Let the ER staff know you were seen today and I spoke with the Triage Nurse.  There is a note in the chart.  I can be reached on my cell [phone] or through Epic direct messenger.   Concern for digit threatening ischemia due to vasculitis.

## 2019-06-11 NOTE — Discharge Instructions (Signed)
We have offered you transfer to [facility] who has accepted you pending a bed becoming available.  You have decided to leave and seek medical evaluation at another institution.  You are free to return at any time should you change your mind and we can help coordinate that transfer.  We have given you pain medication prior to leaving.  Please do not operate a vehicle, do not drive, do not anything that would endanger your safety or others.

## 2019-06-11 NOTE — ED Notes (Signed)
[facility] for bed placement.  They do not have a bed available at this time.  MD notifed.

## 2019-06-12 ENCOUNTER — Encounter: Payer: Self-pay | Admitting: Family Medicine

## 2019-06-12 LAB — COMPLEMENT, TOTAL: Compl, Total (CH50): >60 U/mL (ref >41)

## 2019-06-12 LAB — ANCA TITERS
Atypical P-ANCA titer: <1:20 {titer}
C-ANCA: <1:20 {titer}
P-ANCA: <1:20 {titer}

## 2019-06-12 MED ORDER — OXYCODONE-ACETAMINOPHEN 10-325 MG PO TABS: 1.0000 | ORAL_TABLET | ORAL | 0 refills | Status: DC | PRN

## 2019-06-12 NOTE — Telephone Encounter (Signed)
I had several discussions with patient over the phone. He was discharged yesterday after failing to be able to be transferred to [facility] for urgent rheumatology evaluation. The plan is I understand it is to go to [facility] emergency room tomorrow as [facility] there for probable admission and rheumatology evaluation.  We will try to refer urgently to rheumatology now if possible however I am not optimistic about that.  Will place referral now as well as refill pain medication.

## 2019-06-13 ENCOUNTER — Encounter: Payer: Self-pay | Admitting: Family Medicine

## 2019-06-13 LAB — MPO/PR-3 (ANCA) ANTIBODIES
ANCA Proteinase 3: <3.5 U/mL (ref 0.0–3.5)
Myeloperoxidase Abs: <9 U/mL (ref 0.0–9.0)

## 2019-06-14 ENCOUNTER — Encounter: Payer: Self-pay | Admitting: Family Medicine

## 2019-06-16 ENCOUNTER — Encounter: Payer: Self-pay | Admitting: Family Medicine

## 2019-06-17 ENCOUNTER — Encounter: Payer: Self-pay | Admitting: Family Medicine

## 2019-06-18 ENCOUNTER — Encounter: Payer: Self-pay | Admitting: Family Medicine

## 2019-06-18 MED ORDER — OXYCODONE-ACETAMINOPHEN 10-325 MG PO TABS: 1.0000 | ORAL_TABLET | ORAL | 0 refills | Status: DC | PRN

## 2019-06-19 ENCOUNTER — Encounter: Payer: Self-pay | Admitting: Family Medicine

## 2019-06-19 LAB — BASIC METABOLIC PANEL
BUN: 13 (ref 4–21)
CO2: 21 (ref 13–22)
Chloride: 102 (ref 99–108)
Creatinine: 0.6 (ref 0.6–1.3)
Glucose: 94
Potassium: 3.4 (ref 3.4–5.3)
Sodium: 137 (ref 137–147)

## 2019-06-19 LAB — HEPATIC FUNCTION PANEL
ALT: 56 — AB (ref 10–40)
AST: 41 — AB (ref 14–40)
Alkaline Phosphatase: 133 — AB (ref 25–125)
Bilirubin, Total: 0.6

## 2019-06-19 LAB — COMPREHENSIVE METABOLIC PANEL: Calcium: 9.8 (ref 8.7–10.7)

## 2019-06-19 LAB — CBC AND DIFFERENTIAL
HCT: 39 — AB (ref 41–53)
Hemoglobin: 12.9 — AB (ref 13.5–17.5)
Platelets: 384 (ref 150–399)
WBC: 10.3

## 2019-06-19 LAB — POCT INR: INR: 1 (ref 0.9–1.1)

## 2019-06-20 LAB — CBC AND DIFFERENTIAL
HCT: 41 (ref 41–53)
Hemoglobin: 13.3 — AB (ref 13.5–17.5)
Platelets: 381 (ref 150–399)
WBC: 10.7

## 2019-06-20 LAB — BASIC METABOLIC PANEL
BUN: 14 (ref 4–21)
CO2: 25 — AB (ref 13–22)
Chloride: 104 (ref 99–108)
Creatinine: 0.7 (ref 0.6–1.3)
Glucose: 190
Potassium: 3.5 (ref 3.4–5.3)
Sodium: 140 (ref 137–147)

## 2019-06-20 LAB — COMPREHENSIVE METABOLIC PANEL: Calcium: 8.8 (ref 8.7–10.7)

## 2019-06-20 MED ORDER — BUDESONIDE-FORMOTEROL FUMARATE 160-4.5 MCG/ACT IN AERO
2.00 | INHALATION_SPRAY | RESPIRATORY_TRACT | Status: DC
Start: 2019-06-20 — End: 2019-06-20

## 2019-06-20 MED ORDER — MONTELUKAST SODIUM 10 MG PO TABS
10.00 | ORAL_TABLET | ORAL | Status: DC
Start: 2019-06-20 — End: 2019-06-20

## 2019-06-20 MED ORDER — AMLODIPINE BESYLATE 10 MG PO TABS
10.00 | ORAL_TABLET | ORAL | Status: DC
Start: 2019-06-21 — End: 2019-06-20

## 2019-06-20 MED ORDER — TRAZODONE HCL 50 MG PO TABS
25.00 | ORAL_TABLET | ORAL | Status: DC
Start: ? — End: 2019-06-20

## 2019-06-20 MED ORDER — ALBUTEROL SULFATE HFA 108 (90 BASE) MCG/ACT IN AERS
2.00 | INHALATION_SPRAY | RESPIRATORY_TRACT | Status: DC
Start: ? — End: 2019-06-20

## 2019-06-20 MED ORDER — OXYCODONE HCL 5 MG PO TABS
5.00 | ORAL_TABLET | ORAL | Status: DC
Start: ? — End: 2019-06-20

## 2019-06-20 MED ORDER — GENERIC EXTERNAL MEDICATION
Status: DC
Start: ? — End: 2019-06-20

## 2019-06-20 MED ORDER — LIDOCAINE HCL 1 % IJ SOLN
0.50 | INTRAMUSCULAR | Status: DC
Start: ? — End: 2019-06-20

## 2019-06-20 MED ORDER — ACETAMINOPHEN 325 MG PO TABS
975.00 | ORAL_TABLET | ORAL | Status: DC
Start: 2019-06-20 — End: 2019-06-20

## 2019-06-20 MED ORDER — TAMSULOSIN HCL 0.4 MG PO CAPS
0.40 | ORAL_CAPSULE | ORAL | Status: DC
Start: 2019-06-21 — End: 2019-06-20

## 2019-06-20 MED ORDER — MELATONIN 3 MG PO TABS
3.00 | ORAL_TABLET | ORAL | Status: DC
Start: 2019-06-20 — End: 2019-06-20

## 2019-06-20 MED ORDER — GENERIC EXTERNAL MEDICATION
150.00 | Status: DC
Start: 2019-06-20 — End: 2019-06-20

## 2019-06-20 MED ORDER — ENOXAPARIN SODIUM 40 MG/0.4ML ~~LOC~~ SOLN
40.00 | SUBCUTANEOUS | Status: DC
Start: 2019-06-21 — End: 2019-06-20

## 2019-06-21 LAB — CBC AND DIFFERENTIAL
HCT: 41 (ref 41–53)
Hemoglobin: 13.8 (ref 13.5–17.5)
Platelets: 410 — AB (ref 150–399)
WBC: 12.2

## 2019-06-21 LAB — COMPREHENSIVE METABOLIC PANEL: Calcium: 9.4 (ref 8.7–10.7)

## 2019-06-21 LAB — BASIC METABOLIC PANEL
BUN: 14 (ref 4–21)
CO2: 22 (ref 13–22)
Chloride: 104 (ref 99–108)
Creatinine: 0.6 (ref 0.6–1.3)
Glucose: 109
Potassium: 3.7 (ref 3.4–5.3)
Sodium: 19 — AB (ref 137–147)

## 2019-06-22 ENCOUNTER — Encounter: Payer: Self-pay | Admitting: Family Medicine

## 2019-06-23 ENCOUNTER — Encounter: Payer: Self-pay | Admitting: Family Medicine

## 2019-06-24 ENCOUNTER — Encounter: Payer: Self-pay | Admitting: Family Medicine

## 2019-06-24 MED ORDER — OXYCODONE-ACETAMINOPHEN 10-325 MG PO TABS: 1.0000 | ORAL_TABLET | Freq: Four times a day (QID) | ORAL | 0 refills | Status: AC | PRN

## 2019-06-24 NOTE — Telephone Encounter (Signed)
Please see previous messages as well as emergency room visits.  Chronic pain with further work up still pending.  Patient has been given pain medications by Dr. Georgina Snell, my partner.  He is out of the office at the moment.  Discussed with him in great length.  We will do 1 refill at this time.  Discussed with him the that I would like to make this not long-term.  Will likely need pain management

## 2019-06-26 MED ORDER — VITAMIN D (ERGOCALCIFEROL) 1.25 MG (50000 UNIT) PO CAPS: 50000.0000 [IU] | ORAL_CAPSULE | ORAL | 0 refills | Status: DC

## 2019-06-26 NOTE — Addendum Note (Signed)
Addended by: Lyndal Pulley on: 06/26/2019 07:38 AM   Modules accepted: Orders

## 2019-06-27 ENCOUNTER — Other Ambulatory Visit: Payer: Self-pay | Admitting: Family Medicine

## 2019-06-28 ENCOUNTER — Encounter (HOSPITAL_COMMUNITY): Payer: Self-pay

## 2019-06-28 ENCOUNTER — Other Ambulatory Visit: Payer: Self-pay

## 2019-06-28 ENCOUNTER — Ambulatory Visit (HOSPITAL_COMMUNITY)
Admission: EM | Admit: 2019-06-28 | Discharge: 2019-06-28 | Disposition: A | Discharge status: Home / Self Care | Payer: 59 | Attending: Family Medicine | Admitting: Family Medicine

## 2019-06-28 DIAGNOSIS — M79645 Pain in left finger(s): Secondary | ICD-10-CM | POA: Diagnosis not present

## 2019-06-28 DIAGNOSIS — M79644 Pain in right finger(s): Secondary | ICD-10-CM | POA: Diagnosis not present

## 2019-06-28 MED ORDER — CILOSTAZOL 100 MG PO TABS: 50.0000 mg | ORAL_TABLET | Freq: Two times a day (BID) | ORAL | 1 refills | Status: DC

## 2019-06-28 NOTE — ED Triage Notes (Addendum)
Pt is here after running out of pain meds for pain in his finger/toes that his Rheumatologist. Pt is unable to get an appt with his doctor, his fingers are black & his toes on his right foot are black as well.
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
## 2019-06-28 NOTE — Discharge Instructions (Signed)
If you need pain medicine, contact the doctor on call at [facility] or [place].  This eliminates confusion about how much narcotics you are getting  I refilled the medicine that helps blood flow.  That should help reduce the pain as well.

## 2019-06-29 ENCOUNTER — Encounter: Payer: Self-pay | Admitting: Family Medicine

## 2019-06-30 ENCOUNTER — Encounter: Payer: Self-pay | Admitting: Family Medicine

## 2019-07-01 ENCOUNTER — Telehealth: Payer: Self-pay | Admitting: Family Medicine

## 2019-07-01 MED ORDER — OXYCODONE-ACETAMINOPHEN 10-325 MG PO TABS: 1.0000 | ORAL_TABLET | Freq: Three times a day (TID) | ORAL | 0 refills | Status: DC | PRN

## 2019-07-01 NOTE — Telephone Encounter (Signed)
Patient called asking if he should keep his appointment with oncology this week?  And he would also like a refill on his pain medication (every 4 hours if possible).  Please advise.

## 2019-07-01 NOTE — Telephone Encounter (Signed)
Medication refilled for every 8 hours.  Please keep appointment with oncology.  We will follow-up later this week as scheduled.

## 2019-07-03 ENCOUNTER — Telehealth: Payer: Self-pay | Admitting: Hematology

## 2019-07-03 ENCOUNTER — Telehealth: Payer: Self-pay | Admitting: Family Medicine

## 2019-07-03 NOTE — Telephone Encounter (Signed)
Pt has been cld and rescheduled to see Dr. Irene Limbo on 4/29 at 1pm. Made aware to arrive 15 minutes early.

## 2019-07-03 NOTE — Telephone Encounter (Signed)
My Chart message sent

## 2019-07-03 NOTE — Telephone Encounter (Signed)
Dr. Georgina Snell has responded to one of the pt's many Pt Advice Request messages.  Pt will be seen in the office tomorrow and pain medication situation will be discussed at that time.

## 2019-07-03 NOTE — Telephone Encounter (Signed)
Pt called,said he sent MyChart message today with pictures of new symptoms and requesting refill of pain meds. He is taking it every 6 hours during the day and 4 hours at night, so he will be out by this afternoon.

## 2019-07-04 ENCOUNTER — Ambulatory Visit (INDEPENDENT_AMBULATORY_CARE_PROVIDER_SITE_OTHER): Payer: 59 | Admitting: Family Medicine

## 2019-07-04 ENCOUNTER — Encounter: Payer: Self-pay | Admitting: Family Medicine

## 2019-07-04 ENCOUNTER — Inpatient Hospital Stay: Payer: 59 | Admitting: Hematology

## 2019-07-04 ENCOUNTER — Inpatient Hospital Stay: Payer: 59

## 2019-07-04 ENCOUNTER — Ambulatory Visit: Payer: 59 | Admitting: Family Medicine

## 2019-07-04 ENCOUNTER — Other Ambulatory Visit: Payer: Self-pay

## 2019-07-04 VITALS — BP 140/76 | HR 121 | Ht 72.0 in | Wt 221.8 lb

## 2019-07-04 DIAGNOSIS — G894 Chronic pain syndrome: Secondary | ICD-10-CM | POA: Diagnosis not present

## 2019-07-04 DIAGNOSIS — L97511 Non-pressure chronic ulcer of other part of right foot limited to breakdown of skin: Secondary | ICD-10-CM | POA: Insufficient documentation

## 2019-07-04 DIAGNOSIS — R0989 Other specified symptoms and signs involving the circulatory and respiratory systems: Secondary | ICD-10-CM | POA: Diagnosis not present

## 2019-07-04 DIAGNOSIS — T380X5A Adverse effect of glucocorticoids and synthetic analogues, initial encounter: Secondary | ICD-10-CM | POA: Insufficient documentation

## 2019-07-04 DIAGNOSIS — E1151 Type 2 diabetes mellitus with diabetic peripheral angiopathy without gangrene: Secondary | ICD-10-CM

## 2019-07-04 DIAGNOSIS — E119 Type 2 diabetes mellitus without complications: Secondary | ICD-10-CM

## 2019-07-04 DIAGNOSIS — R739 Hyperglycemia, unspecified: Secondary | ICD-10-CM | POA: Insufficient documentation

## 2019-07-04 HISTORY — DX: Type 2 diabetes mellitus without complications: E11.9

## 2019-07-04 MED ORDER — AMITRIPTYLINE HCL 50 MG PO TABS: 50.0000 mg | ORAL_TABLET | Freq: Every evening | ORAL | 3 refills | Status: DC | PRN

## 2019-07-04 MED ORDER — GABAPENTIN 800 MG PO TABS: 800.0000 mg | ORAL_TABLET | Freq: Three times a day (TID) | ORAL | 3 refills | Status: AC

## 2019-07-04 MED ORDER — METFORMIN HCL 500 MG PO TABS: 500.0000 mg | ORAL_TABLET | Freq: Two times a day (BID) | ORAL | 2 refills | Status: DC

## 2019-07-04 MED ORDER — OXYCODONE-ACETAMINOPHEN 10-325 MG PO TABS: 1.0000 | ORAL_TABLET | Freq: Four times a day (QID) | ORAL | 0 refills | Status: DC | PRN

## 2019-07-04 NOTE — Progress Notes (Signed)
I, [name], LAT, ATC, am serving as scribe for Dr. [name].  Ryan [name] is a 34 y.o. male who presents to [place] at [facility] today for f/u of L leg pain, B hand and B foot pain.  He was last seen by Dr. Georgina Snell on 06/11/19 and most recently seen at the [facility] on 06/28/19.  In the interim he has been seen by [facility] rheumatology.  It is unclear of his fundamental underlying diagnosis.  He has symptoms consistent with Raynaud's phenomenon.  However he has not responded to initial management with 60 mg of prednisone 10 mg of amlodipine and 20 mg of sildenafil daily.  He continues to have worsening hand and toe pain coldness and dusky appearance.  He is developing ulcers now in his finger and toe.  He is a follow-up appointment relatively soon with his rheumatologist. Additionally he has his first initial consultation with heme-onc regarding his elevated white blood cell count and possibility of paraneoplastic syndrome in the near future as well.  He continues to have severe pain related to his underlying vascular inflammation issue.  He has oxycodone 10/325.  He has been taking this about every 6 hours.  He notes this is not quite sufficient to control his pain.  Additionally he has been prescribed Lyrica which he thinks has not helped all that much at all.  He is extremely anxious and worried about his issue.  His problem is now disabling him with severe symptoms.  Rheumatologist is Dr Ouida Sills at [facility].  Additionally his blood sugar has been elevated for a bit now.  He checks his blood sugar in the morning fasting and is usually over 126.  Sometimes his blood sugars as high as 170 in the morning.  He has never been treated for diabetes.  Pertinent review of systems: No fevers or chills.  Tremor insomnia pain.  No nausea vomiting diarrhea.  Relevant historical information: No personal history of vasculitis.  Positive family history for  diabetes. Remote history of drug abuse has been clean for years.    Exam:  BP 140/76 (BP Location: Left Arm, Patient Position: Sitting, Cuff Size: Large)   Pulse (!) 121   Ht 6' (1.829 m)   Wt 221 lb 12.8 oz (100.6 kg)   SpO2 91%   BMI 30.08 kg/m  General: In pain appearing.   MSK: Hands and feet: Dusky appearance fingers and toes.  Ulceration present left third digit hand palmar aspect distal phalanx.  Additional ulcer present right fifth toe plantar aspect. Decreased capillary refill and sensation distally.   Patient also has tremors with hands bilaterally Psych: Alert and oriented.  Tearful and anxious affect.             Assessment and Plan: 34 y.o. male with vascular information with poor perfusion hands and feet bilaterally now developing ulcerations. Fundamental underlying medical problem is not understood at this point despite multiple different attempts by myself rheumatologist other specialist. Patient is not responding to initial treatment with prednisone amlodipine and sildenafil for nods phenomenon.  He has a follow-up appointment in the near future where he notes they will probably proceed with biopsy.  I think this is a great idea.  We will communicate with his primary care provider and his rheumatologist regarding current presentation today in clinic.  Fundamentally I am alarmed and concerned.  Pain control: At this point that is my biggest role.  We had a long discussion  today regarding pain management strategies and goals and rules.  At this point he is becoming chronic pain but given his progressively worsening symptoms I do not think he is appropriate for referral to pain management at this point.  Noted that effectively the max prescription for opiates that most primary care or nonpain specialist providers will prescribe is his current dose of oxycodone 10/325 total 4/day.Marland Kitchen  He agrees that this is reasonable. Additionally as Lyrica has not been helpful we  will add higher dose gabapentin.  We will start 800 mg 3 times daily.  Noted that he can break the pill in half and take a smaller dose if needed. Additionally we will add amitriptyline for bedtime.  This should help with pain and for to help keep him asleep. I have prescribed a 1 month supply of his oxycodone.  Communication: Also discussed communication strategies for the future.  Patient is very anxious and has been sending lots of MyChart messages.  Advised him that one concise message per day is the goal.  Beyond that his messages are hard to read and comprehend and important messages may be forgotten or lost.  Diabetes: Patient has had elevated blood sugar for some time and now especially on his prednisone has been a bit higher.  I think it is time to officially diagnose his diabetes.  We will start treatment with Metformin and hand off responsibility for this to PCP.  Goal for blood sugar control is to get blood sugar a bit better control to give his body the best chance to heal as possible.  Recheck back with me in 1 month or sooner if needed.  CC.  Marin Olp, MD PCP  Rheum: Era Skeen, MD  [place], [place] [id number]  [phone]  [phone] (Fax)   PDMP reviewed during this encounter. No orders of the defined types were placed in this encounter.  Meds ordered this encounter  Medications  . oxyCODONE-acetaminophen (PERCOCET) 10-325 MG tablet    Sig: Take 1 tablet by mouth every 6 (six) hours as needed for pain.    Dispense:  120 tablet    Refill:  0    1 month supply. Now chronic pain treatment.  . gabapentin (NEURONTIN) 800 MG tablet    Sig: Take 1 tablet (800 mg total) by mouth 3 (three) times daily.    Dispense:  90 tablet    Refill:  3    Replaces lyrica  . amitriptyline (ELAVIL) 50 MG tablet    Sig: Take 1-2 tablets (50-100 mg total) by mouth at bedtime as needed (Pain and sleep).    Dispense:  60 tablet    Refill:  3   . metFORMIN (GLUCOPHAGE) 500 MG tablet    Sig: Take 1 tablet (500 mg total) by mouth 2 (two) times daily with a meal.    Dispense:  60 tablet    Refill:  2     Discussed warning signs or symptoms. Please see discharge instructions. Patient expresses understanding.   The above documentation has been reviewed and is accurate and complete [name]    Severe symptoms

## 2019-07-04 NOTE — Patient Instructions (Addendum)
Thank you for coming in today. I will talk with your team.  We have a plan for pain.  Oxycodone 10mg  every 6 hours (4 per day) STOP Lyrica Start high dose gabapentin.  Add amitriptyline to bedtime for pain.  Follow up with Rheumatology as scheduled   Recheck with me prior to 1 month before you run out of Oxycodone.    Start metformin initially once daily then twice daily after a week.

## 2019-07-04 NOTE — Progress Notes (Signed)
Letter written

## 2019-07-05 ENCOUNTER — Ambulatory Visit: Payer: 59

## 2019-07-08 MED ORDER — NALOXONE HCL 0.4 MG/ML IJ SOLN
0.40 | INTRAMUSCULAR | Status: DC
Start: ? — End: 2019-07-08

## 2019-07-08 MED ORDER — SODIUM CHLORIDE 0.9 % IV SOLN
INTRAVENOUS | Status: DC
Start: ? — End: 2019-07-08

## 2019-07-08 MED ORDER — GENERIC EXTERNAL MEDICATION
Status: DC
Start: ? — End: 2019-07-08

## 2019-07-11 ENCOUNTER — Ambulatory Visit: Payer: 59 | Admitting: Family Medicine

## 2019-07-12 DIAGNOSIS — L98499 Non-pressure chronic ulcer of skin of other sites with unspecified severity: Secondary | ICD-10-CM | POA: Insufficient documentation

## 2019-07-12 DIAGNOSIS — I771 Stricture of artery: Secondary | ICD-10-CM | POA: Insufficient documentation

## 2019-07-13 ENCOUNTER — Other Ambulatory Visit: Payer: Self-pay | Admitting: Family Medicine

## 2019-07-18 DIAGNOSIS — I776 Arteritis, unspecified: Secondary | ICD-10-CM | POA: Insufficient documentation

## 2019-07-21 ENCOUNTER — Encounter: Payer: Self-pay | Admitting: Family Medicine

## 2019-07-22 ENCOUNTER — Encounter: Payer: Self-pay | Admitting: Family Medicine

## 2019-07-22 MED ORDER — ONDANSETRON HCL 4 MG/2ML IJ SOLN
4.00 | INTRAMUSCULAR | Status: DC
Start: ? — End: 2019-07-22

## 2019-07-22 MED ORDER — PANTOPRAZOLE SODIUM 40 MG PO TBEC
40.00 | DELAYED_RELEASE_TABLET | ORAL | Status: DC
Start: 2019-07-21 — End: 2019-07-22

## 2019-07-22 MED ORDER — SILDENAFIL CITRATE 20 MG PO TABS
80.00 | ORAL_TABLET | ORAL | Status: DC
Start: 2019-07-21 — End: 2019-07-22

## 2019-07-22 MED ORDER — ENOXAPARIN SODIUM 40 MG/0.4ML ~~LOC~~ SOLN
40.00 | SUBCUTANEOUS | Status: DC
Start: 2019-07-20 — End: 2019-07-22

## 2019-07-22 MED ORDER — TIZANIDINE HCL 4 MG PO TABS
2.00 | ORAL_TABLET | ORAL | Status: DC
Start: 2019-07-20 — End: 2019-07-22

## 2019-07-22 MED ORDER — CLONIDINE 0.2 MG/24HR TD PTWK
1.00 | MEDICATED_PATCH | TRANSDERMAL | Status: DC
Start: 2019-07-22 — End: 2019-07-22

## 2019-07-22 MED ORDER — POLYETHYLENE GLYCOL 3350 17 GM/SCOOP PO POWD
17.00 | ORAL | Status: DC
Start: 2019-07-21 — End: 2019-07-22

## 2019-07-22 MED ORDER — AMITRIPTYLINE HCL 25 MG PO TABS
50.00 | ORAL_TABLET | ORAL | Status: DC
Start: 2019-07-20 — End: 2019-07-22

## 2019-07-22 MED ORDER — LIDOCAINE 5 % EX PTCH
1.00 | MEDICATED_PATCH | CUTANEOUS | Status: DC
Start: 2019-07-21 — End: 2019-07-22

## 2019-07-22 MED ORDER — ALBUTEROL SULFATE HFA 108 (90 BASE) MCG/ACT IN AERS
2.00 | INHALATION_SPRAY | RESPIRATORY_TRACT | Status: DC
Start: ? — End: 2019-07-22

## 2019-07-22 MED ORDER — SENNOSIDES 8.6 MG PO TABS
2.00 | ORAL_TABLET | ORAL | Status: DC
Start: 2019-07-21 — End: 2019-07-22

## 2019-07-22 MED ORDER — HYDROMORPHONE HCL 1 MG/ML IJ SOLN
0.50 | INTRAMUSCULAR | Status: DC
Start: ? — End: 2019-07-22

## 2019-07-22 MED ORDER — BUDESONIDE-FORMOTEROL FUMARATE 160-4.5 MCG/ACT IN AERO
2.00 | INHALATION_SPRAY | RESPIRATORY_TRACT | Status: DC
Start: 2019-07-20 — End: 2019-07-22

## 2019-07-22 MED ORDER — MULTI-VITAMIN PO TABS
1.00 | ORAL_TABLET | ORAL | Status: DC
Start: 2019-07-21 — End: 2019-07-22

## 2019-07-22 MED ORDER — GLUCAGON (RDNA) 1 MG IJ KIT
1.00 | PACK | INTRAMUSCULAR | Status: DC
Start: ? — End: 2019-07-22

## 2019-07-22 MED ORDER — OXYCODONE HCL 5 MG PO TABS
15.00 | ORAL_TABLET | ORAL | Status: DC
Start: ? — End: 2019-07-22

## 2019-07-22 MED ORDER — CHOLECALCIFEROL 1.25 MG (50000 UT) PO CAPS
50000.00 | ORAL_CAPSULE | ORAL | Status: DC
Start: 2019-07-24 — End: 2019-07-22

## 2019-07-22 MED ORDER — LORATADINE 10 MG PO TABS
10.00 | ORAL_TABLET | ORAL | Status: DC
Start: 2019-07-21 — End: 2019-07-22

## 2019-07-22 MED ORDER — GENERIC EXTERNAL MEDICATION
800.00 | Status: DC
Start: 2019-07-20 — End: 2019-07-22

## 2019-07-22 MED ORDER — ACETAMINOPHEN 325 MG PO TABS
975.00 | ORAL_TABLET | ORAL | Status: DC
Start: 2019-07-20 — End: 2019-07-22

## 2019-07-22 MED ORDER — FLUTICASONE PROPIONATE 50 MCG/ACT NA SUSP
1.00 | NASAL | Status: DC
Start: 2019-07-21 — End: 2019-07-22

## 2019-07-22 MED ORDER — ASPIRIN 81 MG PO TBEC
81.00 | DELAYED_RELEASE_TABLET | ORAL | Status: DC
Start: 2019-07-21 — End: 2019-07-22

## 2019-07-22 MED ORDER — AMLODIPINE BESYLATE 10 MG PO TABS
10.00 | ORAL_TABLET | ORAL | Status: DC
Start: 2019-07-21 — End: 2019-07-22

## 2019-07-22 MED ORDER — DEXTROSE 50 % IV SOLN
12.50 | INTRAVENOUS | Status: DC
Start: ? — End: 2019-07-22

## 2019-07-22 MED ORDER — BISACODYL 5 MG PO TBEC
10.00 | DELAYED_RELEASE_TABLET | ORAL | Status: DC
Start: ? — End: 2019-07-22

## 2019-07-23 ENCOUNTER — Encounter: Payer: Self-pay | Admitting: Family Medicine

## 2019-07-23 ENCOUNTER — Other Ambulatory Visit: Payer: Self-pay

## 2019-07-23 ENCOUNTER — Ambulatory Visit (INDEPENDENT_AMBULATORY_CARE_PROVIDER_SITE_OTHER): Payer: 59 | Admitting: Family Medicine

## 2019-07-23 ENCOUNTER — Telehealth: Payer: Self-pay | Admitting: Family Medicine

## 2019-07-23 VITALS — BP 130/84 | Ht 72.0 in | Wt 218.8 lb

## 2019-07-23 DIAGNOSIS — R0989 Other specified symptoms and signs involving the circulatory and respiratory systems: Secondary | ICD-10-CM | POA: Diagnosis not present

## 2019-07-23 DIAGNOSIS — I96 Gangrene, not elsewhere classified: Secondary | ICD-10-CM

## 2019-07-23 DIAGNOSIS — I771 Stricture of artery: Secondary | ICD-10-CM

## 2019-07-23 DIAGNOSIS — I776 Arteritis, unspecified: Secondary | ICD-10-CM

## 2019-07-23 DIAGNOSIS — L98499 Non-pressure chronic ulcer of skin of other sites with unspecified severity: Secondary | ICD-10-CM

## 2019-07-23 MED ORDER — OXYCODONE HCL 20 MG PO TABS: 1.0000 | ORAL_TABLET | ORAL | 0 refills | Status: DC

## 2019-07-23 NOTE — Telephone Encounter (Signed)
[facility] called[facility]. He [facility] [address]

## 2019-07-23 NOTE — Telephone Encounter (Signed)
Medication [facility]

## 2019-07-23 NOTE — Progress Notes (Signed)
I, Ryan [name], LAT, ATC, am serving as scribe for Dr. Lynne [name].  Ryan [name] is a 34 y.o. male who presents to [place] at [facility] today for f/u of B hand and feet pain.  He has been seen multiple times by Dr. Georgina Snell w/ the most recent being on 07/04/19.  He was most recently hospitalized at [facility] by the request of his rheumatologist and was treated for vasculitis.  Since his last visit, pt reports con't pain and lack of blood flow to his hands and tips of his fingers.  He notes that his feet are feeling better but still having some mild issues in his R foot.  He has a pain management appt at [facility] on 08/07/19.  He is needing someone to prescribe him pain medications in the interim.  He is recurrently receiving hyperbaric oxygen to preserve remaining tissue.  He has his follow-up appointment with hyperbaric oxygen today.  He notes quite severe pain in his digits especially during and following hyperbaric oxygen.  He is worried that he is can to lose his hands.  Pertinent review of systems: No fevers or chills.  Severe pain significant anxiety.  Relevant historical information: No tobacco use or marijuana use in months.   Exam:  BP 130/84 (BP Location: Right Arm, Patient Position: Sitting, Cuff Size: Large)   Ht 6' (1.829 m)   Wt 218 lb 12.8 oz (99.2 kg)   BMI 29.67 kg/m  General: Well Developed, well nourished, and in no acute distress.   MSK: Hands have significant distal tissue ischemia and necrosis especially worse left third digit.  Proximal to this the tissue is pink with some capillary refill.  Tender to touch.  No significant erythema at tissue necrosis margins.      Lab and Radiology Results  Discharge summary and notes from [place] reviewed.    Assessment and Plan: 34 y.o. 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  Patient is currently receiving maximal medical therapy and still having difficulty  controlling his symptoms.  He additionally is receiving hyperbaric oxygen therapy to improve perfusion and limit tissue necrosis.  I think this is the correct treatment and at this point there is not much more to add to treat the underlying disease.  He is not currently smoking or using marijuana.  He is exposed to secondhand smoke with his wife which may be a factor here.  Reasonable to try limiting that.  His biggest issue today is pain.  His pain is not very well controlled nor was it very well controlled while he is in the hospital.  He currently is being prescribed 20 mg of oxycodone every 4 hours.  He has his first outpatient visit with pain management on May 12.  I will prescribe oxycodone at the same dose until he can be seen by pain management at [facility].  In the meantime we will attempt to communicate with his pain management provider and his rheumatologist to better coordinate care.  He has severe disease that is not fully controlled that is drastically affecting his quality of life.  07/23/2019 Appointment Hyperbaric Floy Sabina, MD  [place]  [place], [place] [address]  [phone]  [phone] (Fax)    08/07/2019 Initial consult Pain Medicine Wyn Quaker, [place]  [place], [place] [address]  [phone]  [phone] (Fax)    08/21/2019 Office Visit Rheumatology Era Skeen, MD  [place]  [place], [place] [address]  [phone]  [phone] (Fax)      PDMP reviewed during this encounter. No orders of the defined types were placed in this encounter.  Meds ordered this encounter  Medications  . Oxycodone HCl 20 MG TABS    Sig: Take 1 tablet (20 mg total) by mouth every 4 (four) hours.    Dispense:  180 tablet    Refill:  0    1 month supply. Bridging to pain management. Severe pain due to finger ischemia     Discussed warning signs or symptoms. Please see discharge instructions. Patient expresses understanding.   The  above documentation has been reviewed and is accurate and complete Ryan [name]

## 2019-07-23 NOTE — Addendum Note (Signed)
Addended by: Gregor Hams on: 07/23/2019 10:09 AM   Modules accepted: Orders

## 2019-07-23 NOTE — Patient Instructions (Signed)
Thank you for coming in today. I will communicate with your pain doctor for [place] appointment on 5/12. Keep me updated.  Oxycodone 20mg  every 4 hours for pain.  Hyperbaric oxygen is a good idea.   Keep me updated.

## 2019-07-24 ENCOUNTER — Encounter: Payer: Self-pay | Admitting: Family Medicine

## 2019-07-24 NOTE — Progress Notes (Deleted)
Phone [phone] In person visit   Subjective:   Ryan [name] is a 34 y.o. year old very pleasant male patient who presents for/with See problem oriented charting No chief complaint on file.  This visit occurred during the SARS-CoV-2 public health emergency.  Safety protocols were in place, including screening questions prior to the visit, additional usage of staff PPE, and extensive cleaning of exam room while observing appropriate contact time as indicated for disinfecting solutions.   Past Medical History-  Patient Active Problem List   Diagnosis Date Noted  . History of adenomatous polyp of colon 12/21/2016    Priority: Medium  . LLQ abdominal pain 11/03/2016    Priority: Medium  . Chronic low back pain 04/06/2015    Priority: Medium  . Essential hypertension 09/19/2006    Priority: Medium  . Asthma 09/19/2006    Priority: Medium  . Recurrent kidney stones 04/02/2014    Priority: Low  . PANIC ATTACK, ACUTE 08/20/2009    Priority: Low  . Allergic rhinitis 09/19/2006    Priority: Low  . Dry gangrene ([place]) 07/23/2019  . Arteritis ([place]) 07/18/2019  . Arterial insufficiency with ischemic ulcer ([place]) 07/12/2019  . Skin ulcer of toe of right foot, limited to breakdown of skin ([place]) 07/04/2019  . Type 2 diabetes mellitus ([place]) 07/04/2019  . Chronic pain syndrome 07/04/2019  . Poor circulation of extremity 06/05/2019  . Plantar wart, left foot 05/02/2019    Medications- reviewed and updated Current Outpatient Medications  Medication Sig Dispense Refill  . albuterol (VENTOLIN HFA) 108 (90 Base) MCG/ACT inhaler Inhale 2 puffs into the lungs every 6 (six) hours as needed for wheezing or shortness of breath. 18 g 5  . amitriptyline (ELAVIL) 50 MG tablet Take 1-2 tablets (50-100 mg total) by mouth at bedtime as needed (Pain and sleep). 60 tablet 3  . amLODipine (NORVASC) 10 MG tablet Take by mouth.    Marland Kitchen aspirin EC 81 MG tablet Take 81 mg by mouth daily.    . B Complex  Vitamins (VITAMIN B COMPLEX 100 IJ) Take by mouth.    . budesonide-formoterol (SYMBICORT) 160-4.5 MCG/ACT inhaler Inhale 2 puffs into the lungs 2 (two) times daily. 1 Inhaler 12  . cilostazol (PLETAL) 100 MG tablet Take 0.5 tablets (50 mg total) by mouth 2 (two) times daily. 60 tablet 1  . cloNIDine (CATAPRES - DOSED IN MG/24 HR) 0.2 mg/24hr patch Place 0.2 mg onto the skin once a week.    . dicyclomine (BENTYL) 10 MG capsule TAKE 1 CAPSULE BY MOUTH THREE TIMES A DAY AS NEEDED (Patient taking differently: Take 10 mg by mouth 3 (three) times daily as needed (IBS). ) 90 capsule 0  . doxycycline (VIBRA-TABS) 100 MG tablet Take 1 tablet (100 mg total) by mouth 2 (two) times daily for 7 days. 14 tablet 0  . fexofenadine (ALLEGRA) 180 MG tablet Take by mouth.    . fluticasone (FLONASE) 50 MCG/ACT nasal spray PLACE 1 SPRAY INTO BOTH NOSTRILS 2 (TWO) TIMES DAILY 48 mL 1  . gabapentin (NEURONTIN) 800 MG tablet Take 1 tablet (800 mg total) by mouth 3 (three) times daily. 90 tablet 3  . ibuprofen (ADVIL,MOTRIN) 200 MG tablet Take 800 mg by mouth 2 (two) times daily as needed for moderate pain.    Marland Kitchen lidocaine (LIDODERM) 5 % Place 1 patch onto the skin daily. Remove & Discard patch within 12 hours or as directed by MD    . losartan-hydrochlorothiazide (HYZAAR) 100-25 MG tablet Take  1 tablet by mouth daily. 90 tablet 3  . metFORMIN (GLUCOPHAGE) 500 MG tablet Take 1 tablet (500 mg total) by mouth 2 (two) times daily with a meal. 60 tablet 2  . montelukast (SINGULAIR) 10 MG tablet TAKE 1 TABLET BY MOUTH EVERYDAY AT BEDTIME (Patient taking differently: Take 10 mg by mouth at bedtime. ) 90 tablet 0  . morphine (MS CONTIN) 30 MG 12 hr tablet Take 30 mg by mouth every 12 (twelve) hours.    . Multiple Vitamin (MULTI-VITAMIN) tablet Take by mouth.    . naloxone (NARCAN) 4 MG/0.1ML LIQD nasal spray kit Place 1 spray into the nose.    Marland Kitchen NITRO-BID 2 % ointment     . nitroGLYCERIN (NITROGLYN) 2 % ointment Place onto the  skin.    Marland Kitchen omeprazole (PRILOSEC OTC) 20 MG tablet Take by mouth.    . ondansetron (ZOFRAN) 4 MG tablet Take 4 mg by mouth every 8 (eight) hours as needed for nausea or vomiting.    . Oxycodone HCl 20 MG TABS Take 1 tablet (20 mg total) by mouth every 4 (four) hours. 180 tablet 0  . oxyCODONE-acetaminophen (PERCOCET) 10-325 MG tablet Take 1 tablet by mouth every 6 (six) hours as needed for pain. (Patient not taking: Reported on 07/23/2019) 120 tablet 0  . predniSONE (DELTASONE) 20 MG tablet Take 60 mg by mouth daily.    Marland Kitchen senna (GERI-KOT) 8.6 MG tablet Take 1 tablet by mouth daily.    . sildenafil (REVATIO) 20 MG tablet Take by mouth.    Marland Kitchen tiZANidine (ZANAFLEX) 2 MG tablet Take by mouth every 6 (six) hours as needed for muscle spasms.    . Vitamin D, Ergocalciferol, (DRISDOL) 1.25 MG (50000 UNIT) CAPS capsule Take 1 capsule (50,000 Units total) by mouth every 7 (seven) days. 12 capsule 0   No current facility-administered medications for this visit.     Objective:  There were no vitals taken for this visit. Gen: NAD, resting comfortably CV: RRR no murmurs rubs or gallops Lungs: CTAB no crackles, wheeze, rhonchi Abdomen: soft/nontender/nondistended/normal bowel sounds. No rebound or guarding.  Ext: no edema Skin: warm, dry Neuro: grossly normal, moves all extremities  ***    Assessment and Plan    # Vascular disorder- following with Dr. Georgina Snell, rheumatology S:***Patient compliant with cilostazol 50 mg twice daily and nitroglycerin ointment. He is on high-dose prednisone  Pain portion-amitriptyline 50 to 100 mg at bedtime, gabapentin 800 mg three times, lidocaine patch daily, MS Contin 30 mg every 12 hours, oxycodone-had been on Percocet but advanced oxycodone 20 mg every 4 hours. A/P: ***    #Steroid-induced hyperglycemia diabetes -diagnosed by Dr. Georgina Snell with blood sugars trending up S: Medication:***Metformin 500 mg twice daily CBGs- *** Exercise and diet- ***   A/P:  ***  #hypertension S: medication: ***Clonidine 0.2 mg weekly patch, losartan hydrochlorothiazide 100-25 mg Home readings #s: *** A/P: ***  # Asthma S: Maintenance Medication: Symbicort 160-4.5 two puffs twice daily, Singulair 10 mg  As needed medication: ***. Patient is using this *** per week.  A/P: ***    No problem-specific Assessment & Plan notes found for this encounter.   Recommended follow up: ***No follow-ups on file. Future Appointments  Date Time Provider [place]  11/01/2019  2:40 PM Marin Olp, MD [facility]    Lab/Order associations: No diagnosis found.  No orders of the defined types were placed in this encounter.   Time Spent: *** minutes of total time (8:25 AM***-  8:25 AM***) was spent on the date of the encounter performing the following actions: chart review prior to seeing the patient, obtaining history, performing a medically necessary exam, counseling on the treatment plan, placing orders, and documenting in our EHR.   Return precautions advised.  Garret Reddish, MD

## 2019-07-25 ENCOUNTER — Inpatient Hospital Stay: Payer: 59 | Admitting: Hematology

## 2019-07-25 ENCOUNTER — Encounter: Payer: Self-pay | Admitting: Family Medicine

## 2019-07-25 ENCOUNTER — Inpatient Hospital Stay: Payer: 59

## 2019-07-25 MED ORDER — DOXYCYCLINE HYCLATE 100 MG PO TABS
100.0000 mg | ORAL_TABLET | Freq: Two times a day (BID) | ORAL | 0 refills | Status: AC
Start: 2019-07-25 — End: 2019-08-01

## 2019-07-26 ENCOUNTER — Encounter: Payer: Self-pay | Admitting: Family Medicine

## 2019-07-26 ENCOUNTER — Other Ambulatory Visit: Payer: Self-pay | Admitting: Family Medicine

## 2019-07-28 ENCOUNTER — Encounter: Payer: Self-pay | Admitting: Family Medicine

## 2019-07-29 ENCOUNTER — Ambulatory Visit: Payer: 59 | Admitting: Family Medicine

## 2019-07-29 ENCOUNTER — Telehealth: Payer: Self-pay | Admitting: Family Medicine

## 2019-07-29 ENCOUNTER — Encounter: Payer: Self-pay | Admitting: Family Medicine

## 2019-07-29 MED ORDER — XTAMPZA ER 27 MG PO C12A: 1.0000 | EXTENDED_RELEASE_CAPSULE | Freq: Two times a day (BID) | ORAL | 0 refills | Status: DC

## 2019-07-29 NOTE — Telephone Encounter (Signed)
Patient called asking if Dr Georgina Snell could call him at his earliest convenience. Please advise.

## 2019-07-29 NOTE — Telephone Encounter (Signed)
Patient called back. He said that the medication that was sent in for him is over $800 even with a Good RX coupon. Is there something else that can be sent in for him?

## 2019-07-29 NOTE — Telephone Encounter (Signed)
Received a voicemail from Hal Morales, FNP-C from [facility] asking to speak with you. She can be reached at [phone] (cell phone).

## 2019-07-29 NOTE — Telephone Encounter (Signed)
I was able to get a hold of Hal Morales [facility] pain management. Patient will have a safety evaluation to pain management on May 24 and will be seen by one of the pain management physicians June 30.  She made several recommendations regarding pain management and main recommendation is to proceed with extended release buffered oxycodone.  She is asked me to bridge with opiates until he can be seen on June 30 to for [facility] to take over pain management prescription. I will prescribe these medications and will communicate with pain management going forward if needed.

## 2019-07-30 ENCOUNTER — Encounter: Payer: Self-pay | Admitting: Family Medicine

## 2019-07-30 MED ORDER — OXYCODONE HCL ER 30 MG PO T12A: 1.0000 | EXTENDED_RELEASE_TABLET | Freq: Two times a day (BID) | ORAL | 0 refills | Status: DC

## 2019-07-30 NOTE — Telephone Encounter (Signed)
He said that the Good RX coupon was only for generic and they do not have that at the pharmacy so it would be over $700.. Would there be anything cheaper for him to try or should he just get this?

## 2019-07-30 NOTE — Telephone Encounter (Signed)
I did hear back from the pain management PA.  Next option is extended release oxycodone (oxycontin) 30 mg twice daily.  This medicine may require prior authorization as well.  Please keep me updated.  [facility] [address] [facility]

## 2019-07-30 NOTE — Addendum Note (Signed)
Addended by: Gregor Hams on: 07/30/2019 07:58 AM   Modules accepted: Orders

## 2019-07-30 NOTE — Telephone Encounter (Signed)
Patient called stating that new medication sent in is still quite expensive (over $300). This is somewhat doable but he wanted to know if maybe there was something cheaper?

## 2019-07-30 NOTE — Telephone Encounter (Signed)
He called back.. he is going to go ahead and buy it this time. If we can find something cheaper for next time he would like to look into other options.

## 2019-07-30 NOTE — Telephone Encounter (Signed)
I am asking your pain management team for alternatives.

## 2019-07-31 ENCOUNTER — Encounter: Payer: Self-pay | Admitting: Family Medicine

## 2019-08-01 ENCOUNTER — Encounter: Payer: Self-pay | Admitting: Family Medicine

## 2019-08-02 ENCOUNTER — Encounter: Payer: Self-pay | Admitting: Family Medicine

## 2019-08-05 ENCOUNTER — Telehealth: Payer: Self-pay | Admitting: Family Medicine

## 2019-08-05 ENCOUNTER — Encounter: Payer: Self-pay | Admitting: Family Medicine

## 2019-08-05 NOTE — Telephone Encounter (Signed)
Patient called to let Dr Georgina Snell know that he received a letter from his insurance letting him know that he is approved for Xtampza.

## 2019-08-06 ENCOUNTER — Other Ambulatory Visit: Payer: Self-pay

## 2019-08-06 ENCOUNTER — Encounter: Payer: Self-pay | Admitting: Family Medicine

## 2019-08-06 ENCOUNTER — Ambulatory Visit (INDEPENDENT_AMBULATORY_CARE_PROVIDER_SITE_OTHER): Payer: 59 | Admitting: Family Medicine

## 2019-08-06 VITALS — BP 112/78 | Ht 72.0 in | Wt 229.2 lb

## 2019-08-06 DIAGNOSIS — I96 Gangrene, not elsewhere classified: Secondary | ICD-10-CM

## 2019-08-06 DIAGNOSIS — G8929 Other chronic pain: Secondary | ICD-10-CM

## 2019-08-06 DIAGNOSIS — I731 Thromboangiitis obliterans [Buerger's disease]: Secondary | ICD-10-CM | POA: Diagnosis not present

## 2019-08-06 MED ORDER — DOXYCYCLINE HYCLATE 100 MG PO TABS: 100.0000 mg | ORAL_TABLET | Freq: Two times a day (BID) | ORAL | 2 refills | Status: AC

## 2019-08-06 NOTE — Patient Instructions (Signed)
Thank you for coming in today. I will get the Nurse to do a pill count after I see you.  Will communicate with [facility].  Keep me updated.  Probably ok to wait until you start local hyperbaric oxygen.

## 2019-08-06 NOTE — Progress Notes (Signed)
I, [name], LAT, ATC, am serving as scribe for Dr. [name].  Ryan [name] is a 34 y.o. male who presents to [place] at [facility] today for f/u of B hand pain.  He was last seen by Dr. Georgina Snell on 07/23/19 to discuss medication management.  He is being followed by [facility] and pain management.  Since his last visit, pt reports he has a virtual visit on 08/19/19 but is unsure who it's with.  He con't to have significant pain in the majority of his fingers minus his B thumbs and R ring and L pinky fingers.  Will be starting Hyperbaric here in [place] next week.  He is not sure if it is even helpful at all.  He notes his pain is worse after he attends hyperbaric oxygen therapy.  He notes he is going to be out of town next week or [name] goes to [place] for a bit of a vacation.  Will be gone from the 19th to the 26th.  He currently is taking oxycodone for pain.  He takes 20 mg immediate release up to 4 times daily as needed.  He is taking about 4 pills/day.  He notes prior to starting long-acting oxycodone he was taking more so he is going to run out sooner than he normally would.  Additionally takes OxyContin 30 mg 3 times daily now.  He notes he still having quite a bit of breakthrough pain especially in the night and when he wakes up in the morning.  Oxycodone 20mg  IR Pill count 44  Oxycontin 30mg  Pill count 39  Rheumatology recommends prophylactic doxycycline to avoid infection.  Per rheumatology note ultimate plan for managing ischemic necrotic fingertip is eventual autoamputation.    Pertinent review of systems: No fevers or chills  Relevant historical information: Buerger's   Exam:  BP 112/78 (BP Location: Right Arm, Patient Position: Sitting, Cuff Size: Large)   Ht 6' (1.829 m)   Wt 229 lb 3.2 oz (104 kg)   BMI 31.09 kg/m  General: Well Developed, well nourished, and in no acute distress.   MSK: Hands bilaterally have necrotic distal  fingers with viable tissue more proximally.  In general viability is just distal to the DIP.     Assessment and Plan: 34 y.o. 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  Comanaged with [place] rheumatology. My primary role here is pain management until he is able to transfer his care fully to [facility] pain management.  He has upcoming appointments in the near future with [facility].  We have been trying to figure out appropriate dosing of his narcotics.  He is using more than we would like however he clearly is experiencing quite a bit of pain.  After communicating with [facility] management PA who has been helping me with this.  Plan for Xtampza (now approved) 36mg  BID and oxycodone immediate release 4/day up to 5/day.  We will prescribe these medications when he is getting close to running out of his current doses of pills.  Additionally we will go ahead and start doxycycline for prophylaxis for infection as recommended by rheumatology.  PDMP reviewed during this encounter. No orders of the defined types were placed in this encounter.  Meds ordered this encounter  Medications  . doxycycline (VIBRA-TABS) 100 MG tablet    Sig: Take 1 tablet (100 mg total) by mouth 2 (two) times daily for 7 days.    Dispense:  60  tablet    Refill:  2     Discussed warning signs or symptoms. Please see discharge instructions. Patient expresses understanding.   The above documentation has been reviewed and is accurate and complete [name]

## 2019-08-07 ENCOUNTER — Encounter: Payer: Self-pay | Admitting: Family Medicine

## 2019-08-07 MED ORDER — XTAMPZA ER 36 MG PO C12A: 1.0000 | EXTENDED_RELEASE_CAPSULE | Freq: Two times a day (BID) | ORAL | 0 refills | Status: DC

## 2019-08-07 MED ORDER — OXYCODONE HCL 20 MG PO TABS: ORAL_TABLET | ORAL | 0 refills | Status: DC

## 2019-08-08 ENCOUNTER — Encounter: Payer: Self-pay | Admitting: Family Medicine

## 2019-08-12 ENCOUNTER — Encounter: Payer: Self-pay | Admitting: Family Medicine

## 2019-08-13 MED ORDER — SILDENAFIL CITRATE 20 MG PO TABS: 80.0000 mg | ORAL_TABLET | Freq: Three times a day (TID) | ORAL | 6 refills | Status: DC

## 2019-08-13 MED ORDER — AMLODIPINE BESYLATE 10 MG PO TABS: 10.0000 mg | ORAL_TABLET | Freq: Every day | ORAL | 1 refills | Status: DC

## 2019-08-13 MED ORDER — MELOXICAM 15 MG PO TABS
15.0000 mg | ORAL_TABLET | Freq: Every day | ORAL | 3 refills | Status: DC
Start: 2019-08-13 — End: 2019-08-21

## 2019-08-15 ENCOUNTER — Encounter: Payer: Self-pay | Admitting: Family Medicine

## 2019-08-16 MED ORDER — AMLODIPINE BESYLATE 10 MG PO TABS: 10.0000 mg | ORAL_TABLET | Freq: Every day | ORAL | 1 refills | Status: DC

## 2019-08-16 MED ORDER — SILDENAFIL CITRATE 20 MG PO TABS: 80.0000 mg | ORAL_TABLET | Freq: Three times a day (TID) | ORAL | 6 refills | Status: DC

## 2019-08-19 ENCOUNTER — Encounter: Payer: Self-pay | Admitting: Family Medicine

## 2019-08-20 ENCOUNTER — Encounter: Payer: Self-pay | Admitting: Family Medicine

## 2019-08-21 MED ORDER — MELOXICAM 15 MG PO TABS: 15.0000 mg | ORAL_TABLET | Freq: Every day | ORAL | 3 refills | Status: DC

## 2019-08-21 MED ORDER — XTAMPZA ER 36 MG PO C12A: 1.0000 | EXTENDED_RELEASE_CAPSULE | Freq: Three times a day (TID) | ORAL | 0 refills | Status: DC

## 2019-08-22 ENCOUNTER — Encounter: Payer: Self-pay | Admitting: Family Medicine

## 2019-08-23 ENCOUNTER — Encounter: Payer: Self-pay | Admitting: Family Medicine

## 2019-08-25 ENCOUNTER — Encounter: Payer: Self-pay | Admitting: Family Medicine

## 2019-08-26 ENCOUNTER — Encounter: Payer: Self-pay | Admitting: Family Medicine

## 2019-08-27 ENCOUNTER — Encounter: Payer: Self-pay | Admitting: Family Medicine

## 2019-08-28 ENCOUNTER — Encounter: Payer: Self-pay | Admitting: Family Medicine

## 2019-08-29 ENCOUNTER — Encounter: Payer: Self-pay | Admitting: Family Medicine

## 2019-08-30 ENCOUNTER — Encounter: Payer: Self-pay | Admitting: Family Medicine

## 2019-09-02 MED ORDER — TIZANIDINE HCL 2 MG PO TABS: 2.0000 mg | ORAL_TABLET | Freq: Four times a day (QID) | ORAL | 2 refills | Status: AC | PRN

## 2019-09-04 ENCOUNTER — Other Ambulatory Visit: Payer: Self-pay | Admitting: Family Medicine

## 2019-09-04 ENCOUNTER — Encounter: Payer: Self-pay | Admitting: Family Medicine

## 2019-09-06 ENCOUNTER — Encounter: Payer: Self-pay | Admitting: Family Medicine

## 2019-09-10 ENCOUNTER — Telehealth: Payer: Self-pay

## 2019-09-10 NOTE — Telephone Encounter (Signed)
Please call pt, he will need to come in for an OV to have this paper work filled out.

## 2019-09-12 ENCOUNTER — Encounter: Payer: Self-pay | Admitting: Family Medicine

## 2019-09-15 ENCOUNTER — Encounter: Payer: Self-pay | Admitting: Family Medicine

## 2019-09-16 ENCOUNTER — Encounter: Payer: Self-pay | Admitting: Family Medicine

## 2019-09-17 NOTE — Telephone Encounter (Signed)
LVM for patient to call back and schedule

## 2019-09-24 ENCOUNTER — Other Ambulatory Visit: Payer: Self-pay | Admitting: Family Medicine

## 2019-09-24 ENCOUNTER — Encounter: Payer: Self-pay | Admitting: Family Medicine

## 2019-09-26 ENCOUNTER — Encounter: Payer: Self-pay | Admitting: Family Medicine

## 2019-09-26 DIAGNOSIS — I731 Thromboangiitis obliterans [Buerger's disease]: Secondary | ICD-10-CM

## 2019-09-30 ENCOUNTER — Encounter: Payer: Self-pay | Admitting: Family Medicine

## 2019-10-01 ENCOUNTER — Telehealth (INDEPENDENT_AMBULATORY_CARE_PROVIDER_SITE_OTHER): Payer: 59 | Admitting: Family Medicine

## 2019-10-01 ENCOUNTER — Encounter: Payer: Self-pay | Admitting: Family Medicine

## 2019-10-01 VITALS — Temp 98.6°F | Wt 227.0 lb

## 2019-10-01 DIAGNOSIS — K130 Diseases of lips: Secondary | ICD-10-CM
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
## 2019-10-20 ENCOUNTER — Other Ambulatory Visit: Payer: Self-pay | Admitting: Family Medicine

## 2019-10-21 ENCOUNTER — Encounter: Payer: Self-pay | Admitting: Family Medicine

## 2019-10-23 ENCOUNTER — Encounter: Payer: Self-pay | Admitting: Family Medicine

## 2019-10-31 NOTE — Patient Instructions (Addendum)
Health Maintenance Due  Topic Date Due  . PNEUMOCOCCAL POLYSACCHARIDE VACCINE AGE 36-64 HIGH RISK -hold off until autoamputation . Would also recommend fall flu shot once things settle down Never done  . OPHTHALMOLOGY EXAM -needs to schedule Never done   No changes today.   Please stop by lab before you go If you have mychart- we will send your results within 3 business days of [place] receiving them.  If you do not have mychart- we will call you about results within 5 business days of [place] receiving them.  *please note we are currently using [facility] which has a longer processing time than [facility] typically so labs may not come back as quickly as in the past *please also note that you will see labs on mychart as soon as they post. I will later go in and write notes on them- will say "notes from Dr. Yong Channel"
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
## 2019-11-01 ENCOUNTER — Other Ambulatory Visit: Payer: Self-pay

## 2019-11-01 ENCOUNTER — Ambulatory Visit (INDEPENDENT_AMBULATORY_CARE_PROVIDER_SITE_OTHER): Payer: 59 | Admitting: Family Medicine

## 2019-11-01 ENCOUNTER — Encounter: Payer: Self-pay | Admitting: Family Medicine

## 2019-11-01 VITALS — BP 122/72 | HR 93 | Temp 98.4°F | Ht 72.0 in | Wt 236.0 lb

## 2019-11-01 DIAGNOSIS — T380X5A Adverse effect of glucocorticoids and synthetic analogues, initial encounter: Secondary | ICD-10-CM | POA: Diagnosis not present

## 2019-11-01 DIAGNOSIS — R739 Hyperglycemia, unspecified: Secondary | ICD-10-CM | POA: Diagnosis not present

## 2019-11-01 DIAGNOSIS — I10 Essential (primary) hypertension: Secondary | ICD-10-CM | POA: Diagnosis not present

## 2019-11-01 DIAGNOSIS — Z0000 Encounter for general adult medical examination without abnormal findings: Secondary | ICD-10-CM | POA: Diagnosis not present

## 2019-11-01 DIAGNOSIS — E559 Vitamin D deficiency, unspecified: Secondary | ICD-10-CM

## 2019-11-01 DIAGNOSIS — Z114 Encounter for screening for human immunodeficiency virus [HIV]: Secondary | ICD-10-CM | POA: Diagnosis not present

## 2019-11-01 DIAGNOSIS — E1151 Type 2 diabetes mellitus with diabetic peripheral angiopathy without gangrene: Secondary | ICD-10-CM | POA: Diagnosis not present

## 2019-11-01 LAB — POC URINALSYSI DIPSTICK (AUTOMATED)
Bilirubin, UA: NEGATIVE
Blood, UA: NEGATIVE
Glucose, UA: NEGATIVE
Ketones, UA: NEGATIVE
Leukocytes, UA: NEGATIVE
Nitrite, UA: NEGATIVE
Protein, UA: NEGATIVE
Spec Grav, UA: >=1.03 — AB (ref 1.010–1.025)
Urobilinogen, UA: 0.2 E.U./dL
pH, UA: 6 (ref 5.0–8.0)

## 2019-11-02 ENCOUNTER — Encounter: Payer: Self-pay | Admitting: Family Medicine

## 2019-11-02 LAB — COMPLETE METABOLIC PANEL WITH GFR
AG Ratio: 1.9 (calc) (ref 1.0–2.5)
ALT: 31 U/L (ref 9–46)
AST: 29 U/L (ref 10–40)
Albumin: 4.7 g/dL (ref 3.6–5.1)
Alkaline phosphatase (APISO): 96 U/L (ref 36–130)
BUN: 11 mg/dL (ref 7–25)
CO2: 23 mmol/L (ref 20–32)
Calcium: 9.7 mg/dL (ref 8.6–10.3)
Chloride: 104 mmol/L (ref 98–110)
Creat: 0.78 mg/dL (ref 0.60–1.35)
GFR, Est African American: 135 mL/min/{1.73_m2} (ref >=60)
GFR, Est Non African American: 116 mL/min/{1.73_m2} (ref >=60)
Globulin: 2.5 g/dL (calc) (ref 1.9–3.7)
Glucose, Bld: 99 mg/dL (ref 65–99)
Potassium: 4.1 mmol/L (ref 3.5–5.3)
Sodium: 138 mmol/L (ref 135–146)
Total Bilirubin: 0.3 mg/dL (ref 0.2–1.2)
Total Protein: 7.2 g/dL (ref 6.1–8.1)

## 2019-11-02 LAB — CBC WITH DIFFERENTIAL/PLATELET
Absolute Monocytes: 608 cells/uL (ref 200–950)
Basophils Absolute: 99 cells/uL (ref 0–200)
Basophils Relative: 1.3 %
Eosinophils Absolute: 8 cells/uL — ABNORMAL LOW (ref 15–500)
Eosinophils Relative: 0.1 %
HCT: 44.9 % (ref 38.5–50.0)
Hemoglobin: 14.8 g/dL (ref 13.2–17.1)
Lymphs Abs: 2470 cells/uL (ref 850–3900)
MCH: 30.4 pg (ref 27.0–33.0)
MCHC: 33 g/dL (ref 32.0–36.0)
MCV: 92.2 fL (ref 80.0–100.0)
MPV: 10 fL (ref 7.5–12.5)
Monocytes Relative: 8 %
Neutro Abs: 4416 cells/uL (ref 1500–7800)
Neutrophils Relative %: 58.1 %
Platelets: 307 10*3/uL (ref 140–400)
RBC: 4.87 10*6/uL (ref 4.20–5.80)
RDW: 13.5 % (ref 11.0–15.0)
Total Lymphocyte: 32.5 %
WBC: 7.6 10*3/uL (ref 3.8–10.8)

## 2019-11-02 LAB — LIPID PANEL (REFL)
Cholesterol: 272 mg/dL — ABNORMAL HIGH (ref <200)
HDL: 32 mg/dL — ABNORMAL LOW (ref >=40)
Non-HDL Cholesterol (Calc): 240 mg/dL (calc) — ABNORMAL HIGH (ref <130)
Total CHOL/HDL Ratio: 8.5 (calc) — ABNORMAL HIGH (ref <5.0)
Triglycerides: 748 mg/dL — ABNORMAL HIGH (ref <150)

## 2019-11-02 LAB — HEMOGLOBIN A1C
Hgb A1c MFr Bld: 5.5 % of total Hgb (ref <5.7)
Mean Plasma Glucose: 111 (calc)
eAG (mmol/L): 6.2 (calc)

## 2019-11-02 LAB — HIV ANTIBODY (ROUTINE TESTING W REFLEX): HIV 1&2 Ab, 4th Generation: NONREACTIVE

## 2019-11-02 LAB — VITAMIN D 25 HYDROXY (VIT D DEFICIENCY, FRACTURES): Vit D, 25-Hydroxy: 29 ng/mL — ABNORMAL LOW (ref 30–100)

## 2019-11-05 ENCOUNTER — Encounter: Payer: Self-pay | Admitting: Family Medicine

## 2019-11-06 ENCOUNTER — Other Ambulatory Visit: Payer: Self-pay

## 2019-11-06 MED ORDER — ROSUVASTATIN CALCIUM 20 MG PO TABS
20.0000 mg | ORAL_TABLET | Freq: Every day | ORAL | 3 refills | Status: DC
Start: 2019-11-06 — End: 2021-02-01

## 2019-11-08 ENCOUNTER — Encounter: Payer: Self-pay | Admitting: Family Medicine

## 2019-11-08 MED ORDER — AMITRIPTYLINE HCL 100 MG PO TABS: 100.0000 mg | ORAL_TABLET | Freq: Every evening | ORAL | 3 refills | Status: DC | PRN

## 2019-11-11 ENCOUNTER — Encounter: Payer: Self-pay | Admitting: Family Medicine

## 2019-11-12 ENCOUNTER — Encounter: Payer: Self-pay | Admitting: Family Medicine

## 2019-11-12 DIAGNOSIS — M79642 Pain in left hand: Secondary | ICD-10-CM

## 2019-11-13 ENCOUNTER — Other Ambulatory Visit: Payer: Self-pay

## 2019-11-13 DIAGNOSIS — I731 Thromboangiitis obliterans [Buerger's disease]: Secondary | ICD-10-CM

## 2019-11-18 ENCOUNTER — Ambulatory Visit (INDEPENDENT_AMBULATORY_CARE_PROVIDER_SITE_OTHER)
Admission: RE | Admit: 2019-11-18 | Discharge: 2019-11-18 | Disposition: A | Discharge status: Home / Self Care | Payer: 59 | Source: Ambulatory Visit | Attending: Family Medicine | Admitting: Family Medicine

## 2019-11-18 ENCOUNTER — Other Ambulatory Visit: Payer: Self-pay

## 2019-11-18 DIAGNOSIS — M79642 Pain in left hand: Secondary | ICD-10-CM

## 2019-11-23 ENCOUNTER — Encounter: Payer: Self-pay | Admitting: Family Medicine

## 2019-11-24 ENCOUNTER — Other Ambulatory Visit: Payer: Self-pay | Admitting: Family Medicine

## 2019-11-26 ENCOUNTER — Other Ambulatory Visit: Payer: Self-pay

## 2019-11-26 MED ORDER — SILDENAFIL CITRATE 20 MG PO TABS: 80.0000 mg | ORAL_TABLET | Freq: Three times a day (TID) | ORAL | 6 refills | Status: AC

## 2019-11-28 ENCOUNTER — Other Ambulatory Visit: Payer: Self-pay | Admitting: Family Medicine

## 2019-12-01 ENCOUNTER — Encounter: Payer: Self-pay | Admitting: Family Medicine

## 2019-12-01 ENCOUNTER — Other Ambulatory Visit: Payer: Self-pay | Admitting: Family Medicine

## 2019-12-06 ENCOUNTER — Other Ambulatory Visit: Payer: Self-pay

## 2019-12-06 DIAGNOSIS — I731 Thromboangiitis obliterans [Buerger's disease]: Secondary | ICD-10-CM

## 2019-12-09 ENCOUNTER — Other Ambulatory Visit: Payer: Self-pay

## 2019-12-09 DIAGNOSIS — I731 Thromboangiitis obliterans [Buerger's disease]: Secondary | ICD-10-CM

## 2019-12-10 ENCOUNTER — Encounter: Payer: Self-pay | Admitting: Family Medicine

## 2019-12-10 ENCOUNTER — Other Ambulatory Visit: Payer: Self-pay

## 2019-12-10 DIAGNOSIS — I731 Thromboangiitis obliterans [Buerger's disease]: Secondary | ICD-10-CM

## 2019-12-11 ENCOUNTER — Other Ambulatory Visit: Payer: Self-pay

## 2019-12-11 DIAGNOSIS — I10 Essential (primary) hypertension: Secondary | ICD-10-CM

## 2019-12-12 ENCOUNTER — Other Ambulatory Visit: Payer: Self-pay | Admitting: Family Medicine

## 2019-12-15 ENCOUNTER — Other Ambulatory Visit: Payer: Self-pay | Admitting: Family Medicine

## 2019-12-24 ENCOUNTER — Emergency Department (HOSPITAL_COMMUNITY)
Admission: EM | Admit: 2019-12-24 | Discharge: 2019-12-25 | Disposition: A | Discharge status: Home / Self Care | Payer: 59 | Attending: Emergency Medicine | Admitting: Emergency Medicine

## 2019-12-24 ENCOUNTER — Other Ambulatory Visit: Payer: Self-pay

## 2019-12-24 ENCOUNTER — Emergency Department (HOSPITAL_COMMUNITY): Payer: 59

## 2019-12-24 ENCOUNTER — Encounter (HOSPITAL_COMMUNITY): Payer: Self-pay | Admitting: Emergency Medicine

## 2019-12-24 DIAGNOSIS — I10 Essential (primary) hypertension: Secondary | ICD-10-CM | POA: Insufficient documentation

## 2019-12-24 DIAGNOSIS — Z79899 Other long term (current) drug therapy: Secondary | ICD-10-CM | POA: Diagnosis not present

## 2019-12-24 DIAGNOSIS — L946 Ainhum: Secondary | ICD-10-CM

## 2019-12-24 DIAGNOSIS — E119 Type 2 diabetes mellitus without complications: Secondary | ICD-10-CM | POA: Insufficient documentation

## 2019-12-24 DIAGNOSIS — Z7951 Long term (current) use of inhaled steroids: Secondary | ICD-10-CM | POA: Insufficient documentation

## 2019-12-24 DIAGNOSIS — S68623A Partial traumatic transphalangeal amputation of left middle finger, initial encounter: Secondary | ICD-10-CM | POA: Diagnosis present

## 2019-12-24 DIAGNOSIS — J45909 Unspecified asthma, uncomplicated: Secondary | ICD-10-CM | POA: Diagnosis not present

## 2019-12-24 DIAGNOSIS — I731 Thromboangiitis obliterans [Buerger's disease]: Secondary | ICD-10-CM

## 2019-12-24 DIAGNOSIS — Z7982 Long term (current) use of aspirin: Secondary | ICD-10-CM | POA: Diagnosis not present

## 2019-12-24 DIAGNOSIS — Z87891 Personal history of nicotine dependence: Secondary | ICD-10-CM | POA: Diagnosis not present

## 2019-12-24 DIAGNOSIS — W231XXA Caught, crushed, jammed, or pinched between stationary objects, initial encounter: Secondary | ICD-10-CM | POA: Diagnosis not present

## 2019-12-24 MED ORDER — OXYCODONE-ACETAMINOPHEN 5-325 MG PO TABS
1.0000 | ORAL_TABLET | Freq: Once | ORAL | Status: AC
  Administered 2019-12-24: 1 via ORAL
  Filled 2019-12-24: qty 1

## 2019-12-24 NOTE — ED Triage Notes (Signed)
Patient reports distal tip of his necrotic left middle finger fell off this evening at home with minimal bleeding , dressing applied at triage .

## 2019-12-25 MED ORDER — BACITRACIN ZINC 500 UNIT/GM EX OINT: TOPICAL_OINTMENT | Freq: Once | CUTANEOUS | Status: DC
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
## 2019-12-25 NOTE — Discharge Instructions (Addendum)
It was our pleasure to provide your ER care today - we hope that you feel better.  Keep wound very clean. Wash with soap and water 2x/day. You may apply a thin coat of bacitracin to area for the next few days.   Follow up with your doctor and hand surgeon in the next 1-2 weeks.   Return to ER if worse, new symptoms, fevers, spreading redness, severe pain, pus from wound, or other concern.

## 2019-12-25 NOTE — ED Notes (Signed)
Patient verbalizes understanding of discharge instructions. Opportunity for questioning and answers were provided. Arm band removed by staff, patient discharged from ED. 

## 2020-01-01 ENCOUNTER — Encounter: Payer: Self-pay | Admitting: Family Medicine

## 2020-01-13 ENCOUNTER — Encounter: Payer: Self-pay | Admitting: Family Medicine

## 2020-01-15 ENCOUNTER — Encounter: Payer: Self-pay | Admitting: Family Medicine

## 2020-01-15 ENCOUNTER — Telehealth: Payer: Self-pay

## 2020-01-15 NOTE — Telephone Encounter (Signed)
Please schedule pt for virtual visit.

## 2020-01-15 NOTE — Telephone Encounter (Signed)
Pt returned call from Dr. Yong Channel. Pt states he woke up feeling bad and asked if Dr. Yong Channel could send in an antibiotic for him. Pt would also like to talk to Dr. Yong Channel about his medical conditions. Please advise.

## 2020-01-16 ENCOUNTER — Encounter: Payer: Self-pay | Admitting: Family Medicine

## 2020-01-16 ENCOUNTER — Other Ambulatory Visit: Payer: Self-pay

## 2020-01-16 ENCOUNTER — Other Ambulatory Visit: Payer: Self-pay | Admitting: Family Medicine

## 2020-01-16 ENCOUNTER — Telehealth: Payer: 59 | Admitting: Family Medicine

## 2020-01-16 ENCOUNTER — Other Ambulatory Visit: Payer: 59

## 2020-01-16 DIAGNOSIS — M79643 Pain in unspecified hand: Secondary | ICD-10-CM

## 2020-01-16 DIAGNOSIS — I10 Essential (primary) hypertension: Secondary | ICD-10-CM

## 2020-01-16 LAB — CBC WITH DIFFERENTIAL/PLATELET
Absolute Monocytes: 635 cells/uL (ref 200–950)
Basophils Absolute: 122 cells/uL (ref 0–200)
Basophils Relative: 1.4 %
Eosinophils Absolute: 278 cells/uL (ref 15–500)
Eosinophils Relative: 3.2 %
HCT: 42.6 % (ref 38.5–50.0)
Hemoglobin: 14.3 g/dL (ref 13.2–17.1)
Lymphs Abs: 2906 cells/uL (ref 850–3900)
MCH: 30.8 pg (ref 27.0–33.0)
MCHC: 33.6 g/dL (ref 32.0–36.0)
MCV: 91.8 fL (ref 80.0–100.0)
MPV: 10.5 fL (ref 7.5–12.5)
Monocytes Relative: 7.3 %
Neutro Abs: 4759 cells/uL (ref 1500–7800)
Neutrophils Relative %: 54.7 %
Platelets: 365 10*3/uL (ref 140–400)
RBC: 4.64 10*6/uL (ref 4.20–5.80)
RDW: 12.1 % (ref 11.0–15.0)
Total Lymphocyte: 33.4 %
WBC: 8.7 10*3/uL (ref 3.8–10.8)

## 2020-01-16 LAB — COMPREHENSIVE METABOLIC PANEL
AG Ratio: 2 (calc) (ref 1.0–2.5)
ALT: 25 U/L (ref 9–46)
AST: 21 U/L (ref 10–40)
Albumin: 4.6 g/dL (ref 3.6–5.1)
Alkaline phosphatase (APISO): 108 U/L (ref 36–130)
BUN: 15 mg/dL (ref 7–25)
CO2: 25 mmol/L (ref 20–32)
Calcium: 9.8 mg/dL (ref 8.6–10.3)
Chloride: 103 mmol/L (ref 98–110)
Creat: 0.84 mg/dL (ref 0.60–1.35)
Globulin: 2.3 g/dL (calc) (ref 1.9–3.7)
Glucose, Bld: 135 mg/dL — ABNORMAL HIGH (ref 65–99)
Potassium: 4 mmol/L (ref 3.5–5.3)
Sodium: 138 mmol/L (ref 135–146)
Total Bilirubin: 0.4 mg/dL (ref 0.2–1.2)
Total Protein: 6.9 g/dL (ref 6.1–8.1)

## 2020-01-16 NOTE — Telephone Encounter (Signed)
Pt is going to wait for lab results to come back and then will call to make appt for antibiotics if he doesn't hear from his Rheumatoid provider

## 2020-01-18 ENCOUNTER — Encounter: Payer: Self-pay | Admitting: Family Medicine

## 2020-01-20 NOTE — Telephone Encounter (Signed)
Called and spoke with pt, gave below message. He will reach out to his specialist and if he cant get anything with them he will call us back.

## 2020-02-28 ENCOUNTER — Other Ambulatory Visit: Payer: Self-pay | Admitting: Family Medicine

## 2020-03-03 ENCOUNTER — Encounter: Payer: Self-pay | Admitting: Family Medicine

## 2020-03-04 ENCOUNTER — Encounter: Payer: Self-pay | Admitting: Family Medicine

## 2020-03-04 NOTE — Patient Instructions (Incomplete)
Health Maintenance Due  Topic Date Due  . PNEUMOCOCCAL POLYSACCHARIDE VACCINE AGE 36-64 HIGH RISK  Never done  . FOOT EXAM  Never done  . OPHTHALMOLOGY EXAM  Never done  . INFLUENZA VACCINE  10/27/2019   Depression screen [facility] 2/9 11/01/2019 05/02/2019 12/21/2016  Decreased Interest 0 0 0  Down, Depressed, Hopeless 0 0 0  PHQ - 2 Score 0 0 0  Some recent data might be hidden

## 2020-03-04 NOTE — Progress Notes (Deleted)
Phone [phone] In person visit   Subjective:   Ryan [name] is a 34 y.o. year old very pleasant male patient who presents for/with See problem oriented charting No chief complaint on file.   This visit occurred during the SARS-CoV-2 public health emergency.  Safety protocols were in place, including screening questions prior to the visit, additional usage of staff PPE, and extensive cleaning of exam room while observing appropriate contact time as indicated for disinfecting solutions.   Past Medical History-  Patient Active Problem List   Diagnosis Date Noted  . Thromboangiitis obliterans (Buerger's disease) ([place]) 08/06/2019  . Dry gangrene ([place]) 07/23/2019  . Steroid-induced hyperglycemia 07/04/2019  . Chronic pain syndrome 07/04/2019  . Poor circulation of extremity 06/05/2019  . Plantar wart, left foot 05/02/2019  . History of adenomatous polyp of colon 12/21/2016  . LLQ abdominal pain 11/03/2016  . Chronic low back pain 04/06/2015  . Recurrent kidney stones 04/02/2014  . PANIC ATTACK, ACUTE 08/20/2009  . Essential hypertension 09/19/2006  . Allergic rhinitis 09/19/2006  . Asthma 09/19/2006    Medications- reviewed and updated Current Outpatient Medications  Medication Sig Dispense Refill  . albuterol (VENTOLIN HFA) 108 (90 Base) MCG/ACT inhaler Inhale 2 puffs into the lungs every 6 (six) hours as needed for wheezing or shortness of breath. 18 g 5  . amitriptyline (ELAVIL) 100 MG tablet Take 1 tablet (100 mg total) by mouth at bedtime as needed (Pain and sleep). 90 tablet 3  . amLODipine (NORVASC) 10 MG tablet Take 1 tablet (10 mg total) by mouth daily. 90 tablet 1  . aspirin 325 MG EC tablet Take 325 mg by mouth daily.    . B Complex Vitamins (VITAMIN B COMPLEX 100 IJ) Take by mouth.    . budesonide-formoterol (SYMBICORT) 160-4.5 MCG/ACT inhaler Inhale 2 puffs into the lungs 2 (two) times daily. 1 Inhaler 12  . cilostazol (PLETAL) 100 MG tablet Take 0.5  tablets (50 mg total) by mouth 2 (two) times daily. 60 tablet 1  . dicyclomine (BENTYL) 10 MG capsule TAKE 1 CAPSULE BY MOUTH THREE TIMES A DAY AS NEEDED (Patient taking differently: Take 10 mg by mouth 3 (three) times daily as needed (IBS). ) 90 capsule 0  . fexofenadine (ALLEGRA) 180 MG tablet Take by mouth.    . fluticasone (FLONASE) 50 MCG/ACT nasal spray PLACE 1 SPRAY INTO BOTH NOSTRILS 2 (TWO) TIMES DAILY 48 mL 1  . gabapentin (NEURONTIN) 800 MG tablet Take 1 tablet (800 mg total) by mouth 3 (three) times daily. 90 tablet 3  . lidocaine (LIDODERM) 5 % Place 1 patch onto the skin daily. Remove & Discard patch within 12 hours or as directed by MD    . losartan-hydrochlorothiazide (HYZAAR) 100-25 MG tablet Take 1 tablet by mouth daily. 90 tablet 3  . meloxicam (MOBIC) 15 MG tablet Take 1 tablet (15 mg total) by mouth daily. 30 tablet 3  . montelukast (SINGULAIR) 10 MG tablet TAKE 1 TABLET BY MOUTH EVERYDAY AT BEDTIME 90 tablet 0  . Multiple Vitamin (MULTI-VITAMIN) tablet Take by mouth.    . naloxone (NARCAN) 4 MG/0.1ML LIQD nasal spray kit Place 1 spray into the nose.    Marland Kitchen omeprazole (PRILOSEC OTC) 20 MG tablet Take by mouth as needed.     . ondansetron (ZOFRAN) 4 MG tablet Take 4 mg by mouth every 8 (eight) hours as needed for nausea or vomiting.    Marland Kitchen oxyCODONE ER (XTAMPZA ER) 36 MG C12A Take 1 capsule (36 mg  total) by mouth 3 (three) times daily. 90 capsule 0  . Oxycodone HCl 20 MG TABS Take 1 tablet up to 5 times daily in conjunction with long-acting oxycodone. 150 tablet 0  . rosuvastatin (CRESTOR) 20 MG tablet Take 1 tablet (20 mg total) by mouth daily. 90 tablet 3  . senna (GERI-KOT) 8.6 MG tablet Take 1 tablet by mouth daily.    . sildenafil (REVATIO) 20 MG tablet Take 4 tablets (80 mg total) by mouth 3 (three) times daily. For Raynaud 360 tablet 6  . tiZANidine (ZANAFLEX) 2 MG tablet Take 1 tablet (2 mg total) by mouth every 6 (six) hours as needed for muscle spasms. 120 tablet 2  .  Vitamin D, Ergocalciferol, (DRISDOL) 1.25 MG (50000 UNIT) CAPS capsule TAKE 1 CAPSULE (50,000 UNITS TOTAL) BY MOUTH EVERY 7 (SEVEN) DAYS. 12 capsule 0   No current facility-administered medications for this visit.     Objective:  There were no vitals taken for this visit. Gen: NAD, resting comfortably CV: RRR no murmurs rubs or gallops Lungs: CTAB no crackles, wheeze, rhonchi Abdomen: soft/nontender/nondistended/normal bowel sounds. No rebound or guarding.  Ext: no edema Skin: warm, dry Neuro: grossly normal, moves all extremities  ***    Assessment and Plan  *** # Vascular disorder- following with Dr. Georgina Snell, rheumatology at [facility]:*** He is on aspirin high dose 325 mg.  Prior on cilostazol and nitroglycerin ointment- now off   Pain portion-amitriptyline 50 to 100 mg at bedtime, meloxicam 15 mg daily,  gabapentin 800 mg three times, lidocaine patch daily, xtampza 36 mg TID, oxycodone-had been on Percocet but advanced oxycodone 20 mg every 4 hours. -antibiotics on hand if needed (not sure of name for fever or redness around fever tip)  Still with back pain also on tizanidone  -senna to keep bowesl moving - A/P: ***    #Steroid-induced hyperglycemia  diabetes -diagnosed by Dr. Georgina Snell with blood sugars trending up S: Medication:Metformin 500 mg twice daily in past- right now not on anything for 2 months. Off steroids for 3 months No results found for: HGBA1C  A/P: with him being off steroids for at least 3-4 months will get an a1c. I am not firmly confident in diabetes diagnosis- I want to see what a1c is off metformin and off steroids to get a new baseline. Would diagnose with diabetes if a1c 6.5 or above.   #hypertension S: medication: ***amlodipine 10mg , losartan hydrochlorothiazide 100-25 mg Home readings #s: *** A/P: ***  # Asthma S: Maintenance Medication: Symbicort 160-4.5 two puffs twice daily, Singulair 10 mg- off for most of 2021  As needed medication: ***. Patient  is using this *** per week.  A/P: ***  ***  # bentyl in past for IBS symptoms- off recently ***  # allergies- hesitant on flonase due to steroid portion, on allegra  #Vitamin D deficiency S: Medication: vitamin D once a week Last vitamin D Lab Results Component Value Date  VD25OH 22.10 (L) 06/03/2019 A/P: ***   No problem-specific Assessment & Plan notes found for this encounter.   Recommended follow up: ***No follow-ups on file. Future Appointments  Date Time Provider [place]  03/05/2020  4:00 PM Marin Olp, MD [facility]    Lab/Order associations: No diagnosis found.  No orders of the defined types were placed in this encounter.   Time Spent: *** minutes of total time (11:58 AM***- 11:58 AM***) was spent on the date of the encounter performing the following actions: chart review  prior to seeing the patient, obtaining history, performing a medically necessary exam, counseling on the treatment plan, placing orders, and documenting in our EHR.   Return precautions advised.  Clyde Lundborg, CMA

## 2020-03-05 ENCOUNTER — Ambulatory Visit: Payer: 59 | Admitting: Family Medicine

## 2020-03-16 ENCOUNTER — Other Ambulatory Visit: Payer: Self-pay | Admitting: Family Medicine

## 2020-03-23 ENCOUNTER — Encounter: Payer: Self-pay | Admitting: Family Medicine

## 2020-03-25 ENCOUNTER — Encounter: Payer: Self-pay | Admitting: Family Medicine

## 2020-04-16 ENCOUNTER — Other Ambulatory Visit: Payer: Self-pay | Admitting: Family Medicine

## 2020-04-17 ENCOUNTER — Other Ambulatory Visit: Payer: Self-pay | Admitting: Family Medicine

## 2020-04-17 NOTE — Telephone Encounter (Signed)
Rx refill approved per Dr. Corey's orders. 

## 2020-04-24 ENCOUNTER — Other Ambulatory Visit: Payer: Self-pay | Admitting: Family Medicine

## 2020-04-24 ENCOUNTER — Ambulatory Visit: Payer: 59 | Admitting: Family Medicine

## 2020-04-24 NOTE — Telephone Encounter (Signed)
Rx refill

## 2020-04-30 ENCOUNTER — Telehealth: Payer: Self-pay | Admitting: Family Medicine

## 2020-04-30 ENCOUNTER — Telehealth: Payer: Self-pay

## 2020-04-30 ENCOUNTER — Encounter: Payer: Self-pay | Admitting: Family Medicine

## 2020-04-30 DIAGNOSIS — I731 Thromboangiitis obliterans [Buerger's disease]: Secondary | ICD-10-CM

## 2020-04-30 NOTE — Telephone Encounter (Signed)
Blood work will not decisively rule infection in or out.  If you would still like a CBC with differential and CMP I am fine with having ordered-underThromboangiitis obliterans (Buerger's disease) ([place])

## 2020-04-30 NOTE — Telephone Encounter (Signed)
Patient is having an amputation surgery on left index finger and is now having pain in his joint in that same finger and patient was wondering if there is a blood test or something so it can be checked for infection in that finger before the surgery? Please advise ? Thank you

## 2020-04-30 NOTE — Telephone Encounter (Signed)
I am really not the person to make that call. This should be handled bu the surgeon.

## 2020-04-30 NOTE — Telephone Encounter (Signed)
Orders placed, ok to schedule lab visit.

## 2020-04-30 NOTE — Telephone Encounter (Signed)
See below

## 2020-04-30 NOTE — Telephone Encounter (Signed)
Patient called stating that he is scheduled for amputation surgery on 05/04/2020 but he is very concerned because he has been having pain that feels like it is down to the joint. He is worried about a possible infection and would like it addressed/looked at before the surgery with possible blood work and xray?  Would you be willing to see him for this issue?

## 2020-05-01 NOTE — Telephone Encounter (Signed)
Spoke with patient and decided not to schedule

## 2020-05-13 ENCOUNTER — Encounter: Payer: Self-pay | Admitting: Family Medicine

## 2020-05-25 ENCOUNTER — Encounter: Payer: Self-pay | Admitting: Family Medicine

## 2020-06-04 ENCOUNTER — Encounter: Payer: Self-pay | Admitting: Family Medicine

## 2020-06-19 ENCOUNTER — Other Ambulatory Visit: Payer: Self-pay | Admitting: Family Medicine

## 2020-07-16 ENCOUNTER — Other Ambulatory Visit: Payer: Self-pay | Admitting: Family Medicine

## 2020-07-31 ENCOUNTER — Encounter: Payer: Self-pay | Admitting: Family Medicine

## 2020-09-04 ENCOUNTER — Other Ambulatory Visit: Payer: Self-pay | Admitting: Family Medicine

## 2020-09-04 NOTE — Telephone Encounter (Signed)
Rx refill request approved per Dr. Corey's orders. 

## 2020-09-11 ENCOUNTER — Other Ambulatory Visit: Payer: Self-pay | Admitting: Family Medicine

## 2020-09-14 NOTE — Telephone Encounter (Signed)
Rx refill request approved per Dr. Corey's orders. 

## 2020-11-04 ENCOUNTER — Other Ambulatory Visit: Payer: Self-pay

## 2020-11-04 ENCOUNTER — Other Ambulatory Visit: Payer: Self-pay | Admitting: Family Medicine

## 2021-01-01 ENCOUNTER — Encounter: Payer: Self-pay | Admitting: Family Medicine

## 2021-02-01 ENCOUNTER — Telehealth: Payer: Self-pay

## 2021-02-01 ENCOUNTER — Telehealth (INDEPENDENT_AMBULATORY_CARE_PROVIDER_SITE_OTHER): Payer: 59 | Admitting: Family Medicine

## 2021-02-01 ENCOUNTER — Encounter: Payer: Self-pay | Admitting: Family Medicine

## 2021-02-01 VITALS — Temp 101.0°F | Ht 72.0 in | Wt 200.0 lb

## 2021-02-01 DIAGNOSIS — U071 COVID-19: Secondary | ICD-10-CM | POA: Diagnosis not present

## 2021-02-01 DIAGNOSIS — E785 Hyperlipidemia, unspecified: Secondary | ICD-10-CM | POA: Diagnosis not present

## 2021-02-01 DIAGNOSIS — I10 Essential (primary) hypertension: Secondary | ICD-10-CM | POA: Diagnosis not present

## 2021-02-01 MED ORDER — ROSUVASTATIN CALCIUM 20 MG PO TABS: 20.0000 mg | ORAL_TABLET | Freq: Every day | ORAL | 3 refills | Status: DC

## 2021-02-01 NOTE — Progress Notes (Signed)
Phone [phone] Virtual visit via Video note   Subjective:  Chief complaint: Chief Complaint  Patient presents with   Covid Positive    Pt state he tested positive last week and he c/o dizziness, hot flashes and cough with off and on fever of 101. He has been taking tylenol OTC.   This visit type was conducted due to national recommendations for restrictions regarding the COVID-19 Pandemic (e.g. social distancing).  This format is felt to be most appropriate for this patient at this time balancing risks to patient and risks to population by having him in for in person visit.  No physical exam was performed (except for noted visual exam or audio findings with Telehealth visits).    Our team/I connected with Alyson Reedy Mineo at  4:20 PM EST by a video enabled telemedicine application (doxy.me or caregility through epic) and verified that I am speaking with the correct person using two identifiers.  Location patient: [facility] Location provider: [facility], office Persons participating in the virtual visit:  patient  Our team/I discussed the limitations of evaluation and management by telemedicine and the availability of in person appointments. In light of current covid-19 pandemic, patient also understands that we are trying to protect them by minimizing in office contact if at all possible.  The patient expressed consent for telemedicine visit and agreed to proceed. Patient understands insurance will be billed.   Past Medical History-  Patient Active Problem List   Diagnosis Date Noted   Thromboangiitis obliterans (Buerger's disease) ([place]) 08/06/2019    Priority: High   Dry gangrene ([place]) 07/23/2019    Priority: High   Steroid-induced hyperglycemia 07/04/2019    Priority: High   Chronic pain syndrome 07/04/2019    Priority: High   Poor circulation of extremity 06/05/2019    Priority: High   Hyperlipidemia, unspecified 02/01/2021    Priority: Medium    History of  adenomatous polyp of colon 12/21/2016    Priority: Medium    LLQ abdominal pain 11/03/2016    Priority: Medium    Chronic low back pain 04/06/2015    Priority: Medium    Essential hypertension 09/19/2006    Priority: Medium    Asthma 09/19/2006    Priority: Medium    Plantar wart, left foot 05/02/2019    Priority: Low   Recurrent kidney stones 04/02/2014    Priority: Low   PANIC ATTACK, ACUTE 08/20/2009    Priority: Low   Allergic rhinitis 09/19/2006    Priority: Low    Medications- reviewed and updated Current Outpatient Medications  Medication Sig Dispense Refill   albuterol (VENTOLIN HFA) 108 (90 Base) MCG/ACT inhaler TAKE 2 PUFFS BY MOUTH EVERY 6 HOURS AS NEEDED FOR WHEEZE OR SHORTNESS OF BREATH 18 each 5   amitriptyline (ELAVIL) 100 MG tablet Take 1 tablet (100 mg total) by mouth at bedtime as needed (Pain and sleep). 90 tablet 3   amLODipine (NORVASC) 10 MG tablet TAKE 1 TABLET BY MOUTH EVERY DAY 90 tablet 1   aspirin 325 MG EC tablet Take 325 mg by mouth daily.     B Complex Vitamins (VITAMIN B COMPLEX 100 IJ) Take by mouth.     budesonide-formoterol (SYMBICORT) 160-4.5 MCG/ACT inhaler TAKE 2 PUFFS BY MOUTH TWICE A DAY  30.6 each 4   cilostazol (PLETAL) 100 MG tablet Take 0.5 tablets (50 mg total) by mouth 2 (two) times daily. 60 tablet 1   dicyclomine (BENTYL) 10 MG capsule TAKE 1 CAPSULE BY MOUTH THREE  TIMES A DAY AS NEEDED (Patient taking differently: Take 10 mg by mouth 3 (three) times daily as needed (IBS).) 90 capsule 0   fexofenadine (ALLEGRA) 180 MG tablet Take by mouth.     fluticasone (FLONASE) 50 MCG/ACT nasal spray PLACE 1 SPRAY INTO BOTH NOSTRILS 2 (TWO) TIMES DAILY 48 mL 1   gabapentin (NEURONTIN) 800 MG tablet Take 1 tablet (800 mg total) by mouth 3 (three) times daily. 90 tablet 3   losartan-hydrochlorothiazide (HYZAAR) 100-25 MG tablet TAKE 1 TABLET BY MOUTH EVERY DAY 90 tablet 3   Multiple Vitamin (MULTI-VITAMIN) tablet Take by mouth.     naloxone  (NARCAN) 4 MG/0.1ML LIQD nasal spray kit Place 1 spray into the nose.     omeprazole (PRILOSEC OTC) 20 MG tablet Take by mouth as needed.      oxyCODONE ER (XTAMPZA ER) 36 MG C12A Take 1 capsule (36 mg total) by mouth 3 (three) times daily. 90 capsule 0   Oxycodone HCl 20 MG TABS Take 1 tablet up to 5 times daily in conjunction with long-acting oxycodone. 150 tablet 0   tiZANidine (ZANAFLEX) 2 MG tablet Take 1 tablet (2 mg total) by mouth every 6 (six) hours as needed for muscle spasms. 120 tablet 2   Vitamin D, Ergocalciferol, (DRISDOL) 1.25 MG (50000 UNIT) CAPS capsule TAKE 1 CAPSULE (50,000 UNITS TOTAL) BY MOUTH EVERY 7 (SEVEN) DAYS 12 capsule 0   rosuvastatin (CRESTOR) 20 MG tablet Take 1 tablet (20 mg total) by mouth daily. 90 tablet 3   No current facility-administered medications for this visit.     Objective:  Temp (!) 101 F (38.3 C)   Ht 6' (1.829 m)   Wt 200 lb (90.7 kg)   BMI 27.12 kg/m  self reported vitals Gen: NAD, appears fatigued Lungs: nonlabored, normal respiratory rate  Skin: appears dry, no obvious rash     Assessment and Plan   # COVID + S:  Covid Positive    Pt state he tested positive last week. First symptom was Monday or Tuesday of last week.  - symptoms included cough, nausea, fever up to 101, dizziness at first.  -symptoms are pretty stable at this point. Temperature elevating less frequently now- thinks may have had one a few hours ago but not measured.  -overall stable- no significant improvement  - He has been taking tylenol OTC or ibuprofen (recommended against) - has been passed around family- both parents and even son has had it. - not needing albuterol much- more in spring. Not on symbicort  -baseline myalgias increased -not feeling short of breath  A/P: Patient is COVID-positive but outside of the timeline for antiviral treatment.  Encouraged rest and hydration-discussed red flags for follow-up as well as expected improvement within the next  week-if fails to improve we would need to see him back.  Since he still believes he had a fever earlier today we will need to wait 24 hours without fever reducing medicine before can in initial stages of self-isolation then needs to mask for an additional 5 days  #hypertension S: medication:  amlodipine $RemoveBef'10mg'awTFbDaCDb$ , losartan hydrochlorothiazide 100-25 mg Home readings #s: doesn't have cuff at home right now- at grandmas BP Readings from Last 3 Encounters:  12/25/19 (!) 137/99  11/01/19 122/72  08/06/19 112/78  A/P: Blood pressure was not well controlled at last visit-he states he is compliant with medication but has not recently checked home numbers-agrees to get a cuff and attempt to check at home once he  feels better from COVID  #hyperlipidemia S: Medication:rosuvastatin 20 mg in past- he has stopped lately  Lab Results  Component Value Date   CHOL 272 (H) 11/01/2019   HDL 32 (L) 11/01/2019   [place]  11/01/2019     Comment:     . LDL cholesterol not calculated. Triglyceride levels greater than 400 mg/dL invalidate calculated LDL results. . Reference range: <100 . Desirable range <100 mg/dL for primary prevention;   <70 mg/dL for patients with CHD or diabetic patients  with > or = 2 CHD risk factors. Marland Kitchen LDL-C is now calculated using the Martin-Hopkins  calculation, which is a validated novel method providing  better accuracy than the Friedewald equation in the  estimation of LDL-C.  Cresenciano Genre et al. Annamaria Helling. 8657;846(96): 2061-2068  (http://education.[employer].com/faq/FAQ164)    LDLDIRECT 122.5 12/14/2009   TRIG 748 (H) 11/01/2019   CHOLHDL 8.5 (H) 11/01/2019   A/P: Suspect poor control-patient agrees to restart rosuvastatin-also needs to come in for a physical  #Chronic pain-Dr. Marcille Blanco pain management on long and short acting oxycodone -Patient also with history of thromboangiitis obliterans and has lost several digits leading to much of this pain-he would like to discuss  potential disability-discussed may need to do this separate from a physical in the future  Recommended follow up: recommended scheduling annual cpe once gets insurance issues addressed   Lab/Order associations:   ICD-10-CM   1. COVID-19  U07.1     2. Essential hypertension  I10     3. Hyperlipidemia, unspecified hyperlipidemia type  E78.5       Meds ordered this encounter  Medications   rosuvastatin (CRESTOR) 20 MG tablet    Sig: Take 1 tablet (20 mg total) by mouth daily.    Dispense:  90 tablet    Refill:  3   I,Jada Bradford,acting as a scribe for Garret Reddish, MD.,have documented all relevant documentation on the behalf of Garret Reddish, MD,as directed by  Garret Reddish, MD while in the presence of Garret Reddish, MD.   I, Garret Reddish, MD, have reviewed all documentation for this visit. The documentation on 02/01/21 for the exam, diagnosis, procedures, and orders are all accurate and complete.   Return precautions advised.  Garret Reddish, MD

## 2021-02-01 NOTE — Telephone Encounter (Signed)
Ok to schedule virtual for this pm?

## 2021-02-01 NOTE — Telephone Encounter (Signed)
Ok to scheduled today for virtual @4 :20pm.

## 2021-02-01 NOTE — Telephone Encounter (Signed)
Patient is scheduled   

## 2021-02-01 NOTE — Telephone Encounter (Signed)
Patient called in stating that he tested positive for COVID, and wanted to talk with Dr.Hunter about treatment options and his rare condition. Could we work patient in for a virtual slot today?

## 2021-03-24 ENCOUNTER — Telehealth: Payer: Self-pay

## 2021-03-24 ENCOUNTER — Telehealth (INDEPENDENT_AMBULATORY_CARE_PROVIDER_SITE_OTHER): Payer: 59 | Admitting: Family Medicine

## 2021-03-24 ENCOUNTER — Encounter: Payer: Self-pay | Admitting: Family Medicine

## 2021-03-24 ENCOUNTER — Other Ambulatory Visit: Payer: Self-pay

## 2021-03-24 VITALS — Temp 101.0°F

## 2021-03-24 DIAGNOSIS — U071 COVID-19: Secondary | ICD-10-CM | POA: Diagnosis not present

## 2021-03-24 MED ORDER — MOLNUPIRAVIR EUA 200MG CAPSULE: 4.0000 | ORAL_CAPSULE | Freq: Two times a day (BID) | ORAL | 0 refills | Status: AC

## 2021-03-24 MED ORDER — BENZONATATE 100 MG PO CAPS: 100.0000 mg | ORAL_CAPSULE | Freq: Three times a day (TID) | ORAL | 0 refills | Status: DC | PRN

## 2021-03-24 NOTE — Patient Instructions (Signed)
Person Under Monitoring Name: Ryan [name]  Location: [place] [address]   Infection Prevention Recommendations for Individuals Confirmed to have, or Being Evaluated for, 2019 Novel Coronavirus (COVID-19) Infection Who Receive Care at Home  Individuals who are confirmed to have, or are being evaluated for, COVID-19 should follow the prevention steps below until a healthcare provider or local or state health department says they can return to normal activities.  Stay home except to get medical care You should restrict activities outside your home, except for getting medical care. Do not go to work, school, or public areas, and do not use public transportation or taxis.  Call ahead before visiting your doctor Before your medical appointment, call the healthcare provider and tell them that you have, or are being evaluated for, COVID-19 infection. This will help the healthcare providers office take steps to keep other people from getting infected. Ask your healthcare provider to call the local or state health department.  Monitor your symptoms Seek prompt medical attention if your illness is worsening (e.g., difficulty breathing). Before going to your medical appointment, call the healthcare provider and tell them that you have, or are being evaluated for, COVID-19 infection. Ask your healthcare provider to call the local or state health department.  Wear a facemask You should wear a facemask that covers your nose and mouth when you are in the same room with other people and when you visit a healthcare provider. People who live with or visit you should also wear a facemask while they are in the same room with you.  Separate yourself from other people in your home As much as possible, you should stay in a different room from other people in your home. Also, you should use a separate bathroom, if available.  Avoid sharing household items You  should not share dishes, drinking glasses, cups, eating utensils, towels, bedding, or other items with other people in your home. After using these items, you should wash them thoroughly with soap and water.  Cover your coughs and sneezes Cover your mouth and nose with a tissue when you cough or sneeze, or you can cough or sneeze into your sleeve. Throw used tissues in a lined trash can, and immediately wash your hands with soap and water for at least 20 seconds or use an alcohol-based hand rub.  Wash your [facility] your hands often and thoroughly with soap and water for at least 20 seconds. You can use an alcohol-based hand sanitizer if soap and water are not available and if your hands are not visibly dirty. Avoid touching your eyes, nose, and mouth with unwashed hands.   Prevention Steps for Caregivers and Household Members of Individuals Confirmed to have, or Being Evaluated for, COVID-19 Infection Being Cared for in the Home  If you live with, or provide care at home for, a person confirmed to have, or being evaluated for, COVID-19 infection please follow these guidelines to prevent infection:  Follow healthcare providers instructions Make sure that you understand and can help the patient follow any healthcare provider instructions for all care.  Provide for the patients basic needs You should help the patient with basic needs in the home and provide support for getting groceries, prescriptions, and other personal needs.  Monitor the patients symptoms If they are getting sicker, call his or her medical provider and tell them that the patient has, or is being evaluated for, COVID-19 infection. This will help the healthcare providers office  take steps to keep other people from getting infected. Ask the healthcare provider to call the local or state health department.  Limit the number of people who have contact with the patient If possible, have only one caregiver for the  patient. Other household members should stay in another home or place of residence. If this is not possible, they should stay in another room, or be separated from the patient as much as possible. Use a separate bathroom, if available. Restrict visitors who do not have an essential need to be in the home.  Keep older adults, very young children, and other sick people away from the patient Keep older adults, very young children, and those who have compromised immune systems or chronic health conditions away from the patient. This includes people with chronic heart, lung, or kidney conditions, diabetes, and cancer.  Ensure good ventilation Make sure that shared spaces in the home have good air flow, such as from an air conditioner or an opened window, weather permitting.  Wash your hands often Wash your hands often and thoroughly with soap and water for at least 20 seconds. You can use an alcohol based hand sanitizer if soap and water are not available and if your hands are not visibly dirty. Avoid touching your eyes, nose, and mouth with unwashed hands. Use disposable paper towels to dry your hands. If not available, use dedicated cloth towels and replace them when they become wet.  Wear a facemask and gloves Wear a disposable facemask at all times in the room and gloves when you touch or have contact with the patients blood, body fluids, and/or secretions or excretions, such as sweat, saliva, sputum, nasal mucus, vomit, urine, or feces.  Ensure the mask fits over your nose and mouth tightly, and do not touch it during use. Throw out disposable facemasks and gloves after using them. Do not reuse. Wash your hands immediately after removing your facemask and gloves. If your personal clothing becomes contaminated, carefully remove clothing and launder. Wash your hands after handling contaminated clothing. Place all used disposable facemasks, gloves, and other waste in a lined container before  disposing them with other household waste. Remove gloves and wash your hands immediately after handling these items.  Do not share dishes, glasses, or other household items with the patient Avoid sharing household items. You should not share dishes, drinking glasses, cups, eating utensils, towels, bedding, or other items with a patient who is confirmed to have, or being evaluated for, COVID-19 infection. After the person uses these items, you should wash them thoroughly with soap and water.  Wash laundry thoroughly Immediately remove and wash clothes or bedding that have blood, body fluids, and/or secretions or excretions, such as sweat, saliva, sputum, nasal mucus, vomit, urine, or feces, on them. Wear gloves when handling laundry from the patient. Read and follow directions on labels of laundry or clothing items and detergent. In general, wash and dry with the warmest temperatures recommended on the label.  Clean all areas the individual has used often Clean all touchable surfaces, such as counters, tabletops, doorknobs, bathroom fixtures, toilets, phones, keyboards, tablets, and bedside tables, every day. Also, clean any surfaces that may have blood, body fluids, and/or secretions or excretions on them. Wear gloves when cleaning surfaces the patient has come in contact with. Use a diluted bleach solution (e.g., dilute bleach with 1 part bleach and 10 parts water) or a household disinfectant with a label that says EPA-registered for coronaviruses. To make a bleach  solution at home, add 1 tablespoon of bleach to 1 quart (4 cups) of water. For a larger supply, add  cup of bleach to 1 gallon (16 cups) of water. Read labels of cleaning products and follow recommendations provided on product labels. Labels contain instructions for safe and effective use of the cleaning product including precautions you should take when applying the product, such as wearing gloves or eye protection and making sure you  have good ventilation during use of the product. Remove gloves and wash hands immediately after cleaning.  Monitor yourself for signs and symptoms of illness Caregivers and household members are considered close contacts, should monitor their health, and will be asked to limit movement outside of the home to the extent possible. Follow the monitoring steps for close contacts listed on the symptom monitoring form.   ? If you have additional questions, contact your local health department or call the epidemiologist on call at [phone] (available 24/7). ? This guidance is subject to change. For the most up-to-date guidance from [facility], please refer to their website: YouBlogs.pl

## 2021-03-24 NOTE — Telephone Encounter (Signed)
Patient scheduled a virtual visit with Dr. Cherlynn Kaiser.
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
## 2021-03-24 NOTE — Telephone Encounter (Signed)
Patient did not do the UC visit, wanting to know if someone can call in medication as today is the 4th day when asked if he tried to get a virtual visit with one of the providers here in the office.

## 2021-03-24 NOTE — Telephone Encounter (Signed)
[facility] were full for today.  I have advised patient to try the virtual UC visit through mychart or offered to schedule for tomorrow.  Patient said that he would try the virtual visit first.  I told him to call back if he had issues.    Patient Name: Ryan [name] Gender: Male DOB: 1985-10-17 Age: 35 Y 62 M 8 D Return Phone Number: [phone] (Primary) Address: City/ State/ Zip: [place]  [address] Client Study [place] [facility] [place] Client Site [place] [facility] [place] Night Provider Garret Reddish- MD Contact Type Call Who Is Calling Patient / Member / Family / Caregiver Call Type Triage / Clinical Relationship To Patient Self Return Phone Number [phone] (Primary) Chief Complaint Headache Reason for Call Symptomatic / Request for [place] states that he and his wife tested positive for covid. Caller states that he has a cough, headache, congestion and fatigue. Translation No Nurse Assessment Nurse: Laveda Abbe, RN, Marjory Lies Date/Time Eilene Ghazi Time): 03/23/2021 6:53:36 PM Confirm and document reason for call. If symptomatic, describe symptoms. ---Caller states that he and his wife tested positive for covid (today). Pt has had symptoms since Christmas. Caller states that he has a cough, headache, congestion and fatigue. Fever 101F Does the patient have any new or worsening symptoms? ---Yes Will a triage be completed? ---Yes Related visit to physician within the last 2 weeks? ---No Does the PT have any chronic conditions? (i.e. diabetes, asthma, this includes High risk factors for pregnancy, etc.) ---Yes List chronic conditions. ---Hypertension Pt is seen by a [facility] for a rare vascular disease. Is this a behavioral health or substance abuse call? ---No Guidelines Guideline Title Affirmed Question Affirmed Notes Nurse Date/Time (Eastern Time) COVID-19 - Diagnosed or Suspected [1] HIGH  RISK for severe COVID complications (e.g., weak immune system, age > 33 years, obesity with Guidelines Guideline Title Affirmed Question Affirmed Notes Nurse Date/Time (Eastern Time) BMI 30 or higher, pregnant, chronic lung disease or other chronic medical condition) AND [2] COVID symptoms (e.g., cough, fever) (Exceptions: Already seen by PCP and no new or worsening symptoms.) Disp. Time Eilene Ghazi Time) Disposition Final User 03/23/2021 7:03:29 PM Call PCP within 24 Hours Yes Laveda Abbe, RN, Carlyon Shadow Disagree/Comply Comply Caller Understands Yes PreDisposition Did not know what to do Care Advice Given Per Guideline CALL PCP WITHIN 24 HOURS: * IF OFFICE WILL BE OPEN: Call the office when it opens tomorrow morning. * The symptoms are generally treated the same whether you have COVID-19, influenza or some other respiratory virus. * Cough: Use cough drops. * Feeling dehydrated: Drink extra liquids. If the air in your home is dry, use a humidifier. * Fever: For fever over 101 F (38.3 C), take acetaminophen every 4 to 6 hours (Adults 650 mg) OR ibuprofen every 6 to 8 hours (Adults 400 mg). Before taking any medicine, read all the instructions on the package. Do not take aspirin unless your doctor has prescribed it for you. * Muscle aches, headache, and other pains: Often this comes and goes with the fever. Take acetaminophen every 4 to 6 hours (Adults 650 mg) OR ibuprofen every 6 to 8 hours (Adults 400 mg). Before taking any medicine, read all the instructions on the package. * COUGH SYRUP WITH DEXTROMETHORPHAN: An over-the-counter cough syrup can help your cough. The most common cough suppressant in over-the-counter cough medicines is dextromethorphan. CALL BACK IF: * You become worse

## 2021-05-07 ENCOUNTER — Other Ambulatory Visit: Payer: Self-pay

## 2021-05-07 ENCOUNTER — Ambulatory Visit (INDEPENDENT_AMBULATORY_CARE_PROVIDER_SITE_OTHER): Payer: Managed Care, Other (non HMO) | Admitting: Family Medicine

## 2021-05-07 ENCOUNTER — Encounter: Payer: Self-pay | Admitting: Family Medicine

## 2021-05-07 VITALS — BP 138/86 | HR 96 | Temp 97.7°F | Ht 72.0 in | Wt 193.8 lb

## 2021-05-07 DIAGNOSIS — I10 Essential (primary) hypertension: Secondary | ICD-10-CM | POA: Diagnosis not present

## 2021-05-07 DIAGNOSIS — R739 Hyperglycemia, unspecified: Secondary | ICD-10-CM | POA: Diagnosis not present

## 2021-05-07 DIAGNOSIS — T380X5A Adverse effect of glucocorticoids and synthetic analogues, initial encounter: Secondary | ICD-10-CM

## 2021-05-07 DIAGNOSIS — E559 Vitamin D deficiency, unspecified: Secondary | ICD-10-CM

## 2021-05-07 DIAGNOSIS — I999 Unspecified disorder of circulatory system: Secondary | ICD-10-CM | POA: Diagnosis not present

## 2021-05-07 DIAGNOSIS — J4540 Moderate persistent asthma, uncomplicated: Secondary | ICD-10-CM | POA: Diagnosis not present

## 2021-05-07 MED ORDER — ASPIRIN 325 MG PO TBEC: 325.0000 mg | DELAYED_RELEASE_TABLET | Freq: Every day | ORAL | 2 refills | Status: DC

## 2021-05-07 MED ORDER — ALBUTEROL SULFATE HFA 108 (90 BASE) MCG/ACT IN AERS: INHALATION_SPRAY | RESPIRATORY_TRACT | 5 refills | Status: DC

## 2021-05-07 MED ORDER — ROSUVASTATIN CALCIUM 20 MG PO TABS: 20.0000 mg | ORAL_TABLET | Freq: Every day | ORAL | 3 refills | Status: DC

## 2021-05-07 MED ORDER — AMITRIPTYLINE HCL 100 MG PO TABS: 100.0000 mg | ORAL_TABLET | Freq: Every evening | ORAL | 3 refills | Status: DC | PRN

## 2021-05-07 MED ORDER — BUDESONIDE-FORMOTEROL FUMARATE 160-4.5 MCG/ACT IN AERO: INHALATION_SPRAY | RESPIRATORY_TRACT | 4 refills | Status: AC

## 2021-05-07 MED ORDER — LOSARTAN POTASSIUM-HCTZ 100-25 MG PO TABS: 1.0000 | ORAL_TABLET | Freq: Every day | ORAL | 3 refills | Status: DC

## 2021-05-07 MED ORDER — AMLODIPINE BESYLATE 10 MG PO TABS: 10.0000 mg | ORAL_TABLET | Freq: Every day | ORAL | 1 refills | Status: DC

## 2021-05-07 NOTE — Progress Notes (Signed)
Phone [phone] In person visit   Subjective:   Ryan [name] is a 36 y.o. year old very pleasant male patient who presents for/with See problem oriented charting Chief Complaint  Patient presents with   Disabitiy Paperwork    Pt states he does not have current paperwork with him currently, he just needed an official office visit in relation to his disabled condition (multifactorial- se enote)    This visit occurred during the SARS-CoV-2 public health emergency.  Safety protocols were in place, including screening questions prior to the visit, additional usage of staff PPE, and extensive cleaning of exam room while observing appropriate contact time as indicated for disinfecting solutions.   Past Medical History-  Patient Active Problem List   Diagnosis Date Noted   Thromboangiitis obliterans (Buerger's disease) ([place]) 08/06/2019    Priority: High   Dry gangrene ([place]) 07/23/2019    Priority: High   Steroid-induced hyperglycemia 07/04/2019    Priority: High   Chronic pain syndrome 07/04/2019    Priority: High   Poor circulation of extremity 06/05/2019    Priority: High   Hyperlipidemia, unspecified 02/01/2021    Priority: Medium    History of adenomatous polyp of colon 12/21/2016    Priority: Medium    LLQ abdominal pain 11/03/2016    Priority: Medium    Chronic low back pain 04/06/2015    Priority: Medium    Essential hypertension 09/19/2006    Priority: Medium    Asthma 09/19/2006    Priority: Medium    Plantar wart, left foot 05/02/2019    Priority: Low   Recurrent kidney stones 04/02/2014    Priority: Low   PANIC ATTACK, ACUTE 08/20/2009    Priority: Low   Allergic rhinitis 09/19/2006    Priority: Low    Medications- reviewed and updated Current Outpatient Medications  Medication Sig Dispense Refill   fluticasone (FLONASE) 50 MCG/ACT nasal spray PLACE 1 SPRAY INTO BOTH NOSTRILS 2 (TWO) TIMES DAILY 48 mL 1   gabapentin (NEURONTIN) 800 MG tablet Take  1 tablet (800 mg total) by mouth 3 (three) times daily. 90 tablet 3   Multiple Vitamin (MULTI-VITAMIN) tablet Take by mouth.     naloxone (NARCAN) 4 MG/0.1ML LIQD nasal spray kit Place 1 spray into the nose.     oxyCODONE (OXYCONTIN) 30 MG 12 hr tablet Take by mouth. Through [facility] as well as oxycodone 20 mg which he reports is q4 hours up to 6x a day     Oxycodone HCl 20 MG TABS Take 1 tablet up to 5 times daily in conjunction with long-acting oxycodone. 150 tablet 0   tiZANidine (ZANAFLEX) 2 MG tablet Take 1 tablet (2 mg total) by mouth every 6 (six) hours as needed for muscle spasms. 120 tablet 2   albuterol (VENTOLIN HFA) 108 (90 Base) MCG/ACT inhaler TAKE 2 PUFFS BY MOUTH EVERY 6 HOURS AS NEEDED FOR WHEEZE OR SHORTNESS OF BREATH 18 each 5   amitriptyline (ELAVIL) 100 MG tablet Take 1 tablet (100 mg total) by mouth at bedtime as needed (Pain and sleep). 90 tablet 3   amLODipine (NORVASC) 10 MG tablet Take 1 tablet (10 mg total) by mouth daily. 90 tablet 1   aspirin 325 MG EC tablet Take 1 tablet (325 mg total) by mouth daily. 90 tablet 2   budesonide-formoterol (SYMBICORT) 160-4.5 MCG/ACT inhaler TAKE 2 PUFFS BY MOUTH TWICE A DAY 30.6 each 4   losartan-hydrochlorothiazide (HYZAAR) 100-25 MG tablet Take 1 tablet by mouth daily. [place]  tablet 3   rosuvastatin (CRESTOR) 20 MG tablet Take 1 tablet (20 mg total) by mouth daily. 90 tablet 3   No current facility-administered medications for this visit.     Objective:  BP 138/86    Pulse 96    Temp 97.7 F (36.5 C)    Ht 6' (1.829 m)    Wt 193 lb 12.8 oz (87.9 kg)    SpO2 97%    BMI 26.28 kg/m  Gen: NAD, resting comfortably Patient arrived several minutes late to visit and by the time I was able to see him he received a call about picking up his son-we completed medical evaluation prior to examination but he needed to leave to be able to pick up his son-I recommended scheduling follow-up    Assessment and Plan   #concern for disability- S: patient  is working with dad and at home 40 hours per week. 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 Requires travel/walking/carrying suitcase.  Unfortunately patient is on his feet for more than 10 minutes developed swelling and significant pain in his lower extremities.  He also continues have pain in his fingertips after loss of fingertips on the left hand from Buerger's disease affecting his ability to work on phone/computer/etc.  Patient continues to see pain management at [facility] on high-dose regimen-oxycontin 30 mg 3x a day as well as oxycodone 20 mg up to 6 x a day- usually every 4 hours bt depends on day.  Appears last seen virtually by [facility] on 11/04/2020.  Also on amitriptyline and gabapentin to help with pain  He also uses tizanidine for back pain A/P: My last visit with patient outside of having COVID in late 2022 was November 01, 2019 for physical-he has had ongoing pain management with [facility] and is on high-dose pain medications related to his vascular disease leading to ischemic and neuropathic pain after prior amputations.  Certainly think it would be reasonable to proceed with investigations for disability-we would certainly need pain management to weigh in and potentially rheumatology at [facility].  See concerned about vascular disorder below -I gave him information [email or web address] about how to pursue disability and that he could print off this visit and submit  # Vascular disorder- following with Dr. Georgina Snell in past, rheumatology at [facility]- most closely linked to Buerger's disease S: medication: aspirin high dose 325 mg.  Also on amlodipine as a vasodilator -Prior on cilostazol and nitroglycerin ointment- off around the time   Pain portion-amitriptyline 50 to 100 mg at bedtime as needed, gabapentin 800 mg three times,  xtampza 36 mg TID in the past now on OxyContin 30 mg 3 times daily,oxycodone 20 mg-had been on Percocet but advanced oxycodone 20 mg every 4 hours. -antibiotics on hand if  needed (not sure of name for fever or redness around fever tip) -meloxicam 15 mg daily and lidocaine patch daily in the past but we prefer to avoid with vascular disease - cigarette free but wife smokes some around him.  - ABI of 0 per [facility] report. Distinct dropoff below knee on CT angiography. Ischemia and neuopathic pain noted by [facility] A/P: For likely Buerger's disease-continue aspirin 325 mg, amlodipine for vasodilation-want to control other vascular risk factors such as hypertension and hyperlipidemia and steroid-induced hyperglycemia-see sections below  #Steroid-induced hyperglycemia  diabetes -diagnosed by Dr. Georgina Snell with blood sugars trending up S: Medication: none Metformin 500 mg twice daily in past- he reported he was not on anything for 2 months on 10/2019 visit and was  off steroids for 3 months No results found for: HGBA1C -- I want to see what a1c is off metformin and off steroids to get a new baseline and would diagnose with diabetes if a1c 6.5 or above.  Lab Results  Component Value Date   HGBA1C 5.5 11/01/2019   A/P: Has not been checked recently-update A1c with labs-hopefully at least under 7 will look much better on last check  #hypertension S: medication: amlodipine 10mg  every day and  losartan hydrochlorothiazide 100-25 mg every day BP Readings from Last 3 Encounters:  05/07/21 138/86  12/25/19 (!) 137/99  11/01/19 122/72  A/P: Blood pressure high normal but reports forgetting medicine earlier today-would consider this reasonable control in light of that-can take medications when he gets home and continue current medication for now  #hyperlipidemia S: Medication:rosuvastatin 20mg - not taking much  -nausea he hthinks as reason for stopping Lab Results  Component Value Date   CHOL 272 (H) 11/01/2019   HDL 32 (L) 11/01/2019   [place]  11/01/2019     Comment:     . LDL cholesterol not calculated. Triglyceride levels greater than 400 mg/dL invalidate calculated LDL  results. . Reference range: <100 . Desirable range <100 mg/dL for primary prevention;   <70 mg/dL for patients with CHD or diabetic patients  with > or = 2 CHD risk factors. Marland Kitchen LDL-C is now calculated using the Martin-Hopkins  calculation, which is a validated novel method providing  better accuracy than the Friedewald equation in the  estimation of LDL-C.  Cresenciano Genre et al. Annamaria Helling. 0165;537(48): 2061-2068  (http://education.[employer].com/faq/FAQ164)    LDLDIRECT 122.5 12/14/2009   TRIG 748 (H) 11/01/2019   CHOLHDL 8.5 (H) 11/01/2019   A/P: very poor control - worth rechecking lipids and likely retrialing meds-severe elevations in the past which could contribute to vascular disease though primarily related to vasoconstriction  # Asthma S: Maintenance Medication: Symbicort 160-4.5 two puffs twice daily - off Singulair 10 mg- off for most of 2021 and 2022 As needed medication: albuterol. Maybe using once or twicea month.  A/P: Patient reports reasonable control on Symbicort alone-continue current medication  # bentyl in past for IBS symptoms- off  for many months- not bothering as much lately- pales in comparison to fingers and feet  # allergies- hesitant on flonase due to steroid portion but uses on spring, on allegra but rare  #Vitamin D deficiency S: Medication: vitamin D once a week previously- plans to restart 1000 units Last vitamin D Lab Results  Component Value Date   VD25OH 29 (L) 11/01/2019  A/P: Hopefully improved-he will come back for vitamin D level next week  Recommended follow up: Return in about 4 months (around 09/04/2021) for follow up- or sooner if needed. No future appointments.   Lab/Order associations:   ICD-10-CM   1. Essential hypertension  I10 CBC with Differential/Platelet    Comprehensive metabolic panel    Lipid panel    2. Steroid-induced hyperglycemia  R73.9 Hemoglobin A1c   T38.0X5A     3. Vascular disorder  I99.9     4. Moderate  persistent asthma without complication  O70.78     5. Vitamin D deficiency  E55.9 VITAMIN D 25 Hydroxy (Vit-D Deficiency, Fractures)      Meds ordered this encounter  Medications   losartan-hydrochlorothiazide (HYZAAR) 100-25 MG tablet    Sig: Take 1 tablet by mouth daily.    Dispense:  90 tablet    Refill:  3   aspirin 325 MG EC  tablet    Sig: Take 1 tablet (325 mg total) by mouth daily.    Dispense:  90 tablet    Refill:  2   amLODipine (NORVASC) 10 MG tablet    Sig: Take 1 tablet (10 mg total) by mouth daily.    Dispense:  90 tablet    Refill:  1   amitriptyline (ELAVIL) 100 MG tablet    Sig: Take 1 tablet (100 mg total) by mouth at bedtime as needed (Pain and sleep).    Dispense:  90 tablet    Refill:  3   rosuvastatin (CRESTOR) 20 MG tablet    Sig: Take 1 tablet (20 mg total) by mouth daily.    Dispense:  90 tablet    Refill:  3   albuterol (VENTOLIN HFA) 108 (90 Base) MCG/ACT inhaler    Sig: TAKE 2 PUFFS BY MOUTH EVERY 6 HOURS AS NEEDED FOR WHEEZE OR SHORTNESS OF BREATH    Dispense:  18 each    Refill:  5   budesonide-formoterol (SYMBICORT) 160-4.5 MCG/ACT inhaler    Sig: TAKE 2 PUFFS BY MOUTH TWICE A DAY    Dispense:  30.6 each    Refill:  4    Time Spent: 42 minutes of total time (1:55 PM-2:21 PM, 5:41 PM- 5:57 PM) was spent on the date of the encounter performing the following actions: chart review prior to seeing the patient, obtaining history, performing a medically necessary exam, counseling on the treatment plan, placing orders, and documenting in our EHR.   I,Jada Bradford,acting as a scribe for Garret Reddish, MD.,have documented all relevant documentation on the behalf of Garret Reddish, MD,as directed by  Garret Reddish, MD while in the presence of Garret Reddish, MD.   I, Garret Reddish, MD, have reviewed all documentation for this visit. The documentation on 05/07/21 for the exam, diagnosis, procedures, and orders are all accurate and complete.  Return  precautions advised.  Garret Reddish, MD

## 2021-05-07 NOTE — Patient Instructions (Addendum)
Call back to schedule labs for next week  You can print this note when I finish and submit to social security  Recommended follow up: Return in about 4 months (around 09/04/2021) for follow up- or sooner if needed.

## 2021-05-19 ENCOUNTER — Other Ambulatory Visit: Payer: Self-pay

## 2021-05-19 MED ORDER — AMLODIPINE BESYLATE 10 MG PO TABS: 10.0000 mg | ORAL_TABLET | Freq: Every day | ORAL | 1 refills | Status: AC

## 2021-05-19 MED ORDER — ROSUVASTATIN CALCIUM 20 MG PO TABS: 20.0000 mg | ORAL_TABLET | Freq: Every day | ORAL | 3 refills | Status: AC

## 2021-05-19 MED ORDER — LOSARTAN POTASSIUM-HCTZ 100-25 MG PO TABS: 1.0000 | ORAL_TABLET | Freq: Every day | ORAL | 3 refills | Status: DC

## 2021-05-19 MED ORDER — AMITRIPTYLINE HCL 100 MG PO TABS: 100.0000 mg | ORAL_TABLET | Freq: Every evening | ORAL | 3 refills | Status: DC | PRN

## 2021-05-19 MED ORDER — ASPIRIN 325 MG PO TBEC: 325.0000 mg | DELAYED_RELEASE_TABLET | Freq: Every day | ORAL | 2 refills | Status: DC

## 2021-06-17 ENCOUNTER — Encounter: Payer: Self-pay | Admitting: Family Medicine

## 2021-07-26 ENCOUNTER — Ambulatory Visit: Payer: Self-pay | Admitting: Family Medicine

## 2021-09-02 ENCOUNTER — Encounter: Payer: Self-pay | Admitting: Family Medicine

## 2021-12-17 ENCOUNTER — Ambulatory Visit (INDEPENDENT_AMBULATORY_CARE_PROVIDER_SITE_OTHER): Payer: Commercial Managed Care - HMO

## 2021-12-17 ENCOUNTER — Ambulatory Visit (INDEPENDENT_AMBULATORY_CARE_PROVIDER_SITE_OTHER): Payer: Commercial Managed Care - HMO | Admitting: Family Medicine

## 2021-12-17 ENCOUNTER — Ambulatory Visit: Payer: Managed Care, Other (non HMO) | Admitting: Family Medicine

## 2021-12-17 VITALS — BP 198/112 | HR 99 | Ht 72.0 in | Wt 224.0 lb

## 2021-12-17 DIAGNOSIS — M546 Pain in thoracic spine: Secondary | ICD-10-CM

## 2021-12-17 DIAGNOSIS — G8929 Other chronic pain: Secondary | ICD-10-CM | POA: Diagnosis not present

## 2021-12-17 DIAGNOSIS — M545 Low back pain, unspecified: Secondary | ICD-10-CM

## 2021-12-17 NOTE — Patient Instructions (Addendum)
Thank you for coming in today.   Please get an Xray today before you leave   I've referred you to Physical Therapy.  Let us know if you don't hear from them in one week.   Recheck in 6 weeks.   Heating pad and TENS unit.  Let me know if this is not working.

## 2021-12-17 NOTE — Progress Notes (Signed)
I, Ryan [name], LAT, ATC acting as a scribe for Ryan Leader, MD.  Subjective:    CC: Back pain  HPI: Pt is a 36 y/o male c/o back pain. Pt was previously seen by Dr. Georgina [name] in 2021 for chronic bilat hand pain due to ischemic fingers secondary to Buerger's disease. Of note, pt is currently being seen by [facility] for chronic pain syndrome. Today, pt c/o back pain that really worsened over the last week. Pt locates pain to bilat sides of the low back, L>R, and area in the thoracic back, and at the base of his neck, midline.   Radiating pain: yes- both arms and legs LE & UE numbness/tingling: yes LE weakness: yes Aggravates: prolonged standing, sitting Treatments tried: lidocaine patch, IBU, Tylenol, tizanidine  Dx imaging: 06/02/19 L-spine MRI  05/22/19 L-spine & L hip XR  10/12/16 L-spine XR  02/02/16 T-spine XR  03/25/15 L-spine & T-spine XR  Pertinent review of Systems: No fevers or chills.    Relevant historical information: Positive for Buerger's disease causing finger and toe distal ischemia ultimately causing left hand fingertip amputation second and third fingertips.  Currently taking Suboxone 8 mg last refill September 11.   Objective:    Vitals:   12/17/21 0928  BP: (!) 198/112  Pulse: 99  SpO2: 99%   General: Well Developed, well nourished, and in no acute distress.   MSK: T and L-spine: Normal appearing Nontender midline.  Tender palpation left thoracic and lumbar paraspinal musculature which is rigid to palpation. Decreased thoracic and lumbar motion. Lower extremity strength is intact.  Distal left hand second and third finger amputation.  Lab and Radiology Results  X-ray images T and L-spine obtained today personally and independently interpreted.  Thoracic spine: No acute fractures.  Lumbar spine: Mild scoliosis curvature.  No acute fractures.  Await formal radiology review  Impression and Recommendations:    Assessment and Plan: 36  y.o. male with acute exacerbation of chronic thoracic and lumbar pain focused primarily in the left lumbar paraspinal musculature.  Pain thought to be due to muscle spasm and dysfunction.  He is a good candidate for trial of physical therapy especially dry needling.  However this all occurs in the setting of a very traumatic history of terrible extremely hard to control slowly progressive finger ischemia thought to be Buerger's disease that ultimately required some fingertip amputation.  This was mentally and physically traumatic.  I think today's pain is largely unrelated but collectively we should understand that Ryan [name] will be sensitive to new or changing episodes of pain.  Plan for PT and check back in 6 weeks.  Recommend heating pad TENS unit and percussive massager.  Continue existing prescription of tizanidine for muscle relaxer.  PDMP not reviewed this encounter. Orders Placed This Encounter  Procedures   DG Lumbar Spine 2-3 Views    Standing Status:   Future    Number of Occurrences:   1    Standing Expiration Date:   12/18/2022    Order Specific Question:   Reason for Exam (SYMPTOM  OR DIAGNOSIS REQUIRED)    Answer:   eval lumbar pain    Order Specific Question:   Preferred imaging location?    Answer:   Ryan [name]   DG Thoracic Spine 2 View    Standing Status:   Future    Number of Occurrences:   1    Standing Expiration Date:   12/18/2022    Order Specific Question:  Reason for Exam (SYMPTOM  OR DIAGNOSIS REQUIRED)    Answer:   eval left thoracic back pain    Order Specific Question:   Preferred imaging location?    Answer:   Ryan [name]   Ambulatory referral to Physical Therapy    Referral Priority:   Routine    Referral Type:   Physical Medicine    Referral Reason:   Specialty Services Required    Requested Specialty:   Physical Therapy    Number of Visits Requested:   1   No orders of the defined types were placed in this encounter.   Discussed warning  signs or symptoms. Please see discharge instructions. Patient expresses understanding.   The above documentation has been reviewed and is accurate and complete Ryan [name], M.D. Total encounter time 30 minutes including face-to-face time with the patient and, reviewing past medical record, and charting on the date of service.   Discussion treatment plan and options.  Review of history recently.

## 2021-12-20 ENCOUNTER — Encounter: Payer: Self-pay | Admitting: *Deleted

## 2021-12-20 NOTE — Progress Notes (Signed)
Thoracic spine x-ray looks normal to radiology.

## 2021-12-20 NOTE — Progress Notes (Signed)
Lumbar spine x-ray shows a little bit of scoliosis and a little bit of arthritis changes.

## 2021-12-28 ENCOUNTER — Telehealth: Payer: Self-pay | Admitting: Family Medicine

## 2021-12-28 NOTE — Telephone Encounter (Signed)
Patient called stating that his family had the flu last week and COVID this week so it has delayed him starting PT. He is having a lot of back pain and asked if there was anything he could do that might would help while waiting on PT.  He said that he did get the tens unit and other things that Dr Georgina Snell recommended, but right now it doesn't seem to be helping.  Is there anything that Dr Georgina Snell would prescribe or recommend for him?  Please advise.

## 2021-12-29 NOTE — Telephone Encounter (Signed)
Response sent to pt through [place].

## 2021-12-29 NOTE — Telephone Encounter (Signed)
A muscle relaxer could help.  Would you like me to prescribe tizanidine or some other muscle relaxer?

## 2022-01-14 ENCOUNTER — Encounter: Payer: Self-pay | Admitting: Gastroenterology

## 2022-03-10 ENCOUNTER — Encounter: Payer: Self-pay | Admitting: *Deleted

## 2022-04-23 ENCOUNTER — Other Ambulatory Visit: Payer: Self-pay | Admitting: Family Medicine

## 2022-05-05 ENCOUNTER — Ambulatory Visit (HOSPITAL_COMMUNITY)
Admission: EM | Admit: 2022-05-05 | Discharge: 2022-05-05 | Disposition: A | Discharge status: Home / Self Care | Payer: Commercial Managed Care - HMO

## 2022-05-05 DIAGNOSIS — F1410 Cocaine abuse, uncomplicated: Secondary | ICD-10-CM

## 2022-05-05 DIAGNOSIS — Z008 Encounter for other general examination: Secondary | ICD-10-CM | POA: Diagnosis not present

## 2022-05-05 NOTE — Discharge Instructions (Addendum)
Discharge recommendations:  Patient is to take medications as prescribed. Please see information for follow-up appointment with psychiatry and therapy. Please follow up with your primary care provider for all medical related needs.   Therapy: We recommend that patient participate in individual therapy to address mental health concerns.  Medications: The patient or guardian is to contact a medical professional and/or outpatient provider to address any new side effects that develop. The patient or guardian should update outpatient providers of any new medications and/or medication changes.   Safety:  The patient should abstain from use of illicit substances/drugs and abuse of any medications. If symptoms worsen or do not continue to improve or if the patient becomes actively suicidal or homicidal then it is recommended that the patient return to the closest hospital emergency department, the [facility], or call 911 for further evaluation and treatment. National Suicide Prevention Lifeline 1-800-SUICIDE or 661-148-2090.  About 988 988 offers 24/7 access to trained crisis counselors who can help people experiencing mental health-related distress. People can call or text 988 or chat 988lifeline.org for themselves or if they are worried about a loved one who may need crisis support.  Crisis Mobile: [facility]:                     6515039552 (for crisis response 24 hours a day) [place]:                                            ([phone]      Outpatient Services for Therapy and Medication Management for Medicaid  Based on what you have shared, a list of resources for outpatient therapy and psychiatry is provided below to get you started back on treatment.  It is imperative that you follow through with treatment within 5-7 days from the day of discharge to prevent any further risk to your safety or mental well-being.  You are  not limited to the list provided.  In case of an urgent crisis, you may contact the Mobile Crisis Unit with [facility] at 1.406-211-4606.       [facility] [address], [place], [place], [address] [phone] phone  [facility], [place], [place], [address] [phone] phone [phone] phone ([address], AmeriHealth, Anthem/Elevance, BCBS, [place], [place], ComPsych, Healthy [place], [place], [place], [place], [place], [employer], [place], Out of Network)  [employer], [address] [place], [place], [address] [phone] phone ([place], Anthem/Elevance, [employer] Options/Carelon, [place], [place], [place], [place], [place], [employer], [place], [place], [place], [facility])  [place] [facility] [address] [place], [place], [address] [phone] phone (Medicaid, ask about other insurance)  [facility] [address], [place], [place], [address] [phone] phone [phone] fax ([address], [place] , [place], [place], [place], UHC, [place], Self-Pay)  [address] [place], [place], [address] [phone] phone ([address], Anthem/Elevance, [place], [place], [place], [place], [place], [employer], [place], [place], [place], [facility])  [place] - 6-8 MONTH WAIT FOR THERAPY; SO[facility] FOR MEDICATION MANAGEMENT [address] [place], [place], [address] [phone] phone ([place], [place], [place], [place], Friday Health Plans, [place], [place], [place], [place],  Medicare, Medicaid, Optum, Tricare, [place], [place], [place])  [facility] [address], [place], [place], [address] [phone] phone  [place] [address], [place], [place], [address] [phone] phone  [facility] [address], [place], [place], [address] [phone] phone  [facility] [address] [place], [address] [phone] phone  [facility], [place] [address], [place], [address] [phone] phone  [facility] [place], [place], [address] [phone] phone [phone] fax  [facility] [address] [place], [place], [address] [phone] phone [phone] fax  [facility] [phone] [place], [place], [address] [phone] phone  J[facility] Blossom [address] [place], [place], [address]  [phone] phone  [facility]  (Adults Only) [address] [place], [place], [address]  [phone] phone [phone] fax   SUBSTANCE USE TREATMENT FACILITIES   [facility] [place], [place], [address] [phone] phone NOTE: [facility] is no longer offering IOP services.  Serves those who are low-income or have no insurance.  [facility] [address] [place], [place], [address] [phone] phone [phone] fax NOTE: Does have Substance Abuse-Intensive Outpatient Program [facility]) as well as transitional housing if eligible.  [place], [place], [address] [phone] phone [phone] fax  [place] [address] [place], [place], [address] [phone] phone [phone] fax

## 2022-05-05 NOTE — Progress Notes (Signed)
05/05/22 2040  Patient Reported Information  How Did You Hear About [place]? Family/Friend  What Is the Reason for Your Visit/Call Today? Pt in 2021, whole COVID started he was diagnosed with a rare diseased similar to Buerger Disease at [facility]. Pt reports, he spent a month in the hospital during [place] and was allowed one visitor. Per pt, his wife has his step-daughter and son, so his mother was the main visitor. Pt reports, he was prescribed 6 (20 mg) of Oxycodone and 3 (20 mg) of OxyContin. Pt reports, he has gangrene on his toes and fingers; he went to the beach twice which seen to resolve the gangrene except for two fingers. Pt reports, he medical team suggested he do autoamputation of letting his finger fall of but after 1.5 year his fingers started to ooze. Pt reports, he went to the hospital and his finger tips were amputated on both fingers. Pt reports, his son was scared of holding his hand. Pt reports, he stopped taking his medications for about 8 months to a year ago. Pt reports, his parents called CPS on him three times with various concerns, all cases were closed. Pt reports, using Cocaine 2-3 lines yesterday. Pt reports, taking two shots of alcohol today. Pt denies, SI, HI, AVH, self-injurious behaviors and access to weapons. Pt reports, if discharged he can contract for safety.  How Long Has This Been Causing You Problems? > than 6 months  What Do You Feel Would Help You the Most Today? Alcohol or Drug Use Treatment;Treatment for Depression or other mood problem  Have You Recently Had Any Thoughts About Hurting Yourself? No  Are You Planning to Commit Suicide/Harm Yourself At This time? No  Have you Recently Had Thoughts About [place]? No  Are You Planning To Harm Someone At This Time? No  Explanation: Pt denies, HI.  Have You Used Any Alcohol or Drugs in the Past 24 Hours? Yes  What Did You Use and How Much? Pt reports, he used 2-3 lines of Cocaine, yesterday. Pt  reports, using Cocaine once a week. Pt reports, taking 2 shots of alcohol, today.  Do You Currently Have a Therapist/Psychiatrist? Yes  Name of Therapist/Psychiatrist Pt is linked to Dr. Wynona Dove for psychiatry/medication management with [facility]. Pt reports, his psychiatrist recommends he's linked to outpatient treatment.  Have You Been Recently Discharged From Any Office Practice or Programs? No  Explanation of Discharge From Practice/Program None.  CCA Screening Triage Referral Assessment  Type of Contact Face-to-Face  Location of Assessment [facility] Assessment Services  Provider location [facility] Assessment Services  Collateral Involvement Pt declined for clinician to contact anyone including his wife to obtain addtional information.  Does Patient Have a Stage manager Guardian? No  Legal Guardian Contact Information Pt is his own guardian.  Copy of Legal Guardianship Form in Chart  (Pt is his own guardian.)  Legal Guardian Notified of Arrival   (Pt is his own guardian.)  Legal Guardian Notified of Pending Discharge   (Pt is his own guardian.)  If Minor and Not Living with Parent(s), Who has Custody? Pt is his own guardian.  Is CPS involved or ever been involved? In the Past (Pt reports, his parents caled CPS on his and wife wife three times and all the cases were closed.)  Is APS involved or ever been involved? Never  Patient Determined To Be At Risk for Harm To Self or Others Based on Review of Patient Reported Information  or Presenting Complaint? No  Method No Plan  Availability of Means No access or NA  Intent Vague intent or NA  Notification Required No need or identified person  Additional Information for Danger to Others Potential  (Pt denies, SI/HI.)  Additional Comments for Danger to Others Potential Pt denies, SI/HI.  Are There Guns or Other Weapons in [place]? No  Types of Guns/Weapons Pt denies, access to weapons.  Are These Weapons Safely Secured?  (Pt  denies, access to weapons.)  Who Could Verify You Are Able To Have These Secured: Pt denies, access to weapons.  Do You Have any Outstanding Charges, Pending Court Dates, Parole/Probation? Pt denies, legal involvement.  Contacted To Inform of Risk of Harm To Self or Others: Other: Comment (Pt denies, SI/HI.)  Does Patient Present under Involuntary Commitment? No  [place] of Residence [place]  Patient Currently Receiving the Following Services: Medication Management  Determination of Need Routine (7 days)  Options For Referral Outpatient Therapy    Flowsheet Row ED from 05/05/2022 in [place] No Risk      Determination of need: Routine.   Disposition: Erasmo Score, NP recommends pt is psych cleared. CSW provided outpatient and substance use resources.   Vertell Novak, [place], [facility], [facility] Triage Specialist [phone]

## 2022-05-05 NOTE — ED Notes (Signed)
Pt discharged in no acute distress. A&O x4, ambulatory. Denied SI/HI/AVH. Verbalized understanding of AVS instructions. Belongings from locker returned to pt. Pt escorted to front lobby by staff. Safety maintained.

## 2022-05-05 NOTE — ED Provider Notes (Addendum)
Behavioral Health Urgent Care Medical Screening Exam  Patient Name: Ryan [name] MRN: [id number] Date of Evaluation: 05/05/22 Chief Complaint:  "I'm here more because of my parents than anything". Diagnosis:  Final diagnoses:  Encounter for psychological evaluation  Cocaine abuse ([place])    History of Present illness: Ryan [name] is a 37 y.o. male. With medical history significant for chronic pain syndrome, and Thromboangiitis obliterans (Buerger's disease) and psychiatric history of anxiety, alcohol abuse, and acute panic attack who presented voluntarily as a walk in to [facility] with no psychiatric complaint, but rather stated that he came here more because of his parents, as they are trying to take away his son through [place].  Patient was seen face-to-face by this provider and chart reviewed.  On evaluation, patient is alert, oriented x 3, and cooperative. Speech is clear and coherent. Pt appears casual. Eye contact is fair. Mood is euthymic, affect is congruent with mood. Thought process is coherent and thought content is WDL. Pt denies SI/HI/AVH. There is no objective indication that the patient is responding to internal stimuli. No delusions elicited during this assessment.   Patient reports "I'm here more because of my parents than anything because they will not leave my family alone, it's complicated and I've tried to explain to them a thousand times, about my diagnosis at [facility] and how it has affected me, and this is what I'm going through, and they are doing this in regards to my son and CPS is saying we are not doing anything wrong and they called it fishing".  Patient reports "they are just kind of being lingering over my family and if I don't do this, they have threatened to take my son away, and I have had CPS called me 3 times in the past year with 7 different complaints and not one thing is correct, and the CPS lady told us we have to respond any time there is a  complaint because it is [place] law".  Patient reports he is not here for any specific complaints that he would like to be addressed.  Patient denies being depressed, but states he was prescribed some medications for depression after he spent 1 month locked up in [facility]. Patient reports he is medication compliant. Patient reports "now I'm afraid of hospitals after I was locked up for a month at [facility], I don't think I need anything inpatient, I'm already seeing my PCP and therapist and the other specialists at [facility] and I saw them all within last month".  Patient endorses cocaine use and reports "I don't do it daily, I last did it yesterday and before then it was 10 days ago and I use cocaine as a crutch to get off the massive amounts of opiates they put me on while I was hospitalized at [place], and they also put me on Suboxone afterwards, which was just as bad".   Patient reports he gets all his medications from the specialists at [facility] and he is also enrolled in trial studies for his medical condition.  Patient reports he sees Dr. Hope Budds PCP and Dr. Manson Allan for therapy and they manage his medical and mental health.  Patient reports he socially drinks alcohol.  Patient denies other illicit substance use.  Patient reports he lives with his wife and son.  Patient denies access or presence of guns or other weapons at home.  Patient denies history of suicide attempt.  Patient reports he is employed at a [employer].  Patient  endorses a history of behavioral health hospitalization in the past over 8 years ago. Patient reports he does not currently have any psychiatric diagnosis, but was diagnosed with depression while he was hospitalized at [facility] due to his medical condition (Buerger's disease).  Patient reports he was placed on Seroquel, clonidine and Elavil at the time because he was having a hard time dealing with the pain associated with his condition, but he is not currently depressed and also  not seeking treatment for substance abuse. Patient reports " I don't have any drug problems".  Patient reports his sleep and appetite is good and willingness to continue with his outpatient providers at [facility].  Support and encouragement and reassurance provided about ongoing stressors, patient is provided with opportunity for questions.  [facility] from 05/05/2022 in [place] [facility]       Psychiatric Specialty Exam  Presentation  General Appearance:Casual  Eye Contact:Fair  Speech:Normal Rate  Speech Volume:Normal  Handedness:Right   Mood and Affect  Mood: Euthymic  Affect: Congruent   Thought Process  Thought Processes: Coherent  Descriptions of Associations:Intact  Orientation:Full (Time, Place and Person)  Thought Content:WDL    Hallucinations:None  Ideas of Reference:None  Suicidal Thoughts:No  Homicidal Thoughts:No   Sensorium  Memory: Immediate Fair  Judgment: Fair  Insight: Present   Executive Functions  Concentration: Good  Attention Span: Good  Recall: Good  Fund of Knowledge: Good  Language: Good   Psychomotor Activity  Psychomotor Activity: Normal   Assets  Assets: Communication Skills; Desire for Improvement; Resilience; Social Support   Sleep  Sleep: Good  Number of hours: No data recorded  Physical Exam: Physical Exam Constitutional:      General: He is not in acute distress.    Appearance: He is not diaphoretic.  HENT:     Head: Normocephalic.     Right Ear: External ear normal.     Left Ear: External ear normal.     Nose: No congestion.  Eyes:     General:        Right eye: No discharge.        Left eye: No discharge.  Pulmonary:     Effort: No respiratory distress.  Chest:     Chest wall: No tenderness.  Neurological:     Mental Status: He is alert and oriented to person, place, and time.  Psychiatric:        Attention  and Perception: Attention and perception normal.        Mood and Affect: Mood and affect normal.        Speech: Speech normal.        Behavior: Behavior is cooperative.        Thought Content: Thought content normal.        Cognition and Memory: Cognition and memory normal.    Review of Systems  Constitutional:  Negative for chills, diaphoresis and fever.  HENT:  Negative for congestion.   Eyes:  Negative for discharge.  Respiratory:  Negative for cough, shortness of breath and wheezing.   Cardiovascular:  Negative for chest pain and palpitations.  Gastrointestinal:  Negative for diarrhea, nausea and vomiting.  Neurological:  Negative for dizziness, seizures, loss of consciousness and headaches.  Psychiatric/Behavioral:  Positive for substance abuse. Negative for depression, hallucinations and suicidal ideas. The patient is not nervous/anxious.    Blood pressure (!) 134/94, pulse (!) 104, temperature 98.1 F (36.7 C), temperature source  Tympanic, resp. rate 18, SpO2 97 %. There is no height or weight on file to calculate BMI.  Musculoskeletal: Strength & Muscle Tone: within normal limits Gait & Station: normal Patient leans: N/A   [place] [facility] Discharge Disposition for Follow up and Recommendations: Based on my evaluation the patient does not appear to have an emergency medical condition and can be discharged with resources and follow up care in outpatient services for Medication Management and Individual Therapy  Recommend discharge home and follow-up with outpatient psychiatric Services for medication management and therapy.  Patient does not meet inpatient psychiatric admission criteria or IVC criteria and there is no evidence of imminent risk of harm to self or others.  Patient declined substance abuse treatment resources offered and states he does not have any drug problems. LCSW provided SA resources in [facility].  Discussed methods to reduce the risk of self-injury or suicide  attempts: Frequent conversations regarding unsafe thoughts. Remove all significant sharps. Remove all firearms. Remove all medications, including over-the-counter meds. Consider lockbox for medications and having a responsible person dispense medications until patient has strengthened coping skills. Room checks for sharps or other harmful objects. Secure all chemical substances that can be ingested or inhaled.   Please refrain from using alcohol or illicit substances, as they can affect your mood and can cause depression, anxiety or other concerning symptoms.  Alcohol can increase the chance that a person will make reckless decisions, like attempting suicide, and can increase the lethality of a drug overdose.    Discussed crisis plan, calling 911, or going to the ED if condition changes or worsens.  Discussed follow-up with PCP for medical issues, concerns or health needs . Patient verbalizes understanding.  Patient discharged home and condition at discharge is stable.  Randon Goldsmith, NP 05/05/2022, 10:58 PM

## 2022-05-28 ENCOUNTER — Other Ambulatory Visit: Payer: Self-pay | Admitting: Family Medicine

## 2022-05-29 ENCOUNTER — Other Ambulatory Visit: Payer: Self-pay | Admitting: Family Medicine

## 2022-06-20 ENCOUNTER — Encounter: Payer: Self-pay | Admitting: Family Medicine

## 2022-06-22 ENCOUNTER — Ambulatory Visit: Payer: Commercial Managed Care - HMO | Admitting: Family

## 2022-06-23 ENCOUNTER — Ambulatory Visit: Payer: Commercial Managed Care - HMO | Admitting: Internal Medicine

## 2022-07-30 ENCOUNTER — Other Ambulatory Visit: Payer: Self-pay | Admitting: Family Medicine

## 2022-08-05 ENCOUNTER — Encounter: Payer: Self-pay | Admitting: Family Medicine

## 2022-08-16 ENCOUNTER — Encounter: Payer: Self-pay | Admitting: Family Medicine

## 2022-09-01 ENCOUNTER — Other Ambulatory Visit (HOSPITAL_BASED_OUTPATIENT_CLINIC_OR_DEPARTMENT_OTHER): Payer: Self-pay

## 2022-09-01 ENCOUNTER — Other Ambulatory Visit (HOSPITAL_COMMUNITY): Payer: Self-pay

## 2022-10-21 ENCOUNTER — Telehealth: Payer: Self-pay | Admitting: Family Medicine

## 2022-10-21 ENCOUNTER — Encounter: Payer: Self-pay | Admitting: Family Medicine

## 2022-10-21 NOTE — Telephone Encounter (Signed)
Patient called stating that back in 2021, Dr Denyse Amass gave him a "shoe". He asked if he could have 2 more?  Please advise.

## 2022-10-24 NOTE — Telephone Encounter (Signed)
We need additional information.  Called pt, left VM to call the office.

## 2022-10-25 NOTE — Telephone Encounter (Signed)
See MyChart message from pt.

## 2022-10-25 NOTE — Telephone Encounter (Signed)
Forwarding to Dr. Denyse Amass to see if he recalls brace.

## 2022-10-27 ENCOUNTER — Other Ambulatory Visit: Payer: Self-pay | Admitting: Family Medicine

## 2022-12-01 ENCOUNTER — Other Ambulatory Visit: Payer: Self-pay | Admitting: Family Medicine

## 2023-01-03 ENCOUNTER — Other Ambulatory Visit: Payer: Self-pay | Admitting: Family Medicine

## 2023-01-31 ENCOUNTER — Other Ambulatory Visit: Payer: Self-pay | Admitting: Family Medicine

## 2023-04-05 DIAGNOSIS — F410 Panic disorder [episodic paroxysmal anxiety] without agoraphobia: Secondary | ICD-10-CM | POA: Diagnosis not present

## 2023-04-05 DIAGNOSIS — F4389 Other reactions to severe stress: Secondary | ICD-10-CM | POA: Diagnosis not present

## 2023-04-05 DIAGNOSIS — F411 Generalized anxiety disorder: Secondary | ICD-10-CM | POA: Diagnosis not present

## 2023-06-15 ENCOUNTER — Ambulatory Visit: Payer: Self-pay | Admitting: Family Medicine

## 2023-06-15 ENCOUNTER — Telehealth: Payer: Self-pay

## 2023-06-15 ENCOUNTER — Ambulatory Visit (INDEPENDENT_AMBULATORY_CARE_PROVIDER_SITE_OTHER): Admitting: Physician Assistant

## 2023-06-15 VITALS — BP 178/98 | HR 64 | Temp 97.2°F | Ht 72.0 in | Wt 181.4 lb

## 2023-06-15 DIAGNOSIS — R319 Hematuria, unspecified: Secondary | ICD-10-CM

## 2023-06-15 DIAGNOSIS — I10 Essential (primary) hypertension: Secondary | ICD-10-CM | POA: Diagnosis not present

## 2023-06-15 LAB — POCT URINALYSIS DIPSTICK
Bilirubin, UA: NEGATIVE
Glucose, UA: NEGATIVE
Ketones, UA: NEGATIVE
Nitrite, UA: NEGATIVE
Protein, UA: POSITIVE — AB
Spec Grav, UA: 1.01 (ref 1.010–1.025)
Urobilinogen, UA: 0.2 U/dL
pH, UA: 8 (ref 5.0–8.0)

## 2023-06-15 MED ORDER — DICLOFENAC SODIUM 75 MG PO TBEC: 75.0000 mg | DELAYED_RELEASE_TABLET | Freq: Two times a day (BID) | ORAL | 0 refills | Status: DC

## 2023-06-15 MED ORDER — TAMSULOSIN HCL 0.4 MG PO CAPS: 0.4000 mg | ORAL_CAPSULE | Freq: Every day | ORAL | 0 refills | Status: DC

## 2023-06-15 NOTE — Telephone Encounter (Signed)
Please call and schedule pt for lab visit
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
## 2023-06-15 NOTE — Telephone Encounter (Signed)
 FYI.  Copied from CRM [id number]. Topic: General - Other >> Jun 15, 2023 12:06 PM Ryan [name] T wrote: Reason for CRM: patient wants the dr to know he has not taken any pain meds in two years he just left from the office and he would like for dr Mervin Kung to speak with dr hunter regarding pain medication

## 2023-06-15 NOTE — Telephone Encounter (Signed)
 Agree with antiinflammatory pain medicine particularly for kidney stones but we really need kidney function done- I believe Ryan [name]'s team is working to get him set up with that . Would also love for renal stone study to be done to make sure no significant swelling around kidney

## 2023-06-15 NOTE — Patient Instructions (Addendum)
 It was great to see you!  I recommend CT scan and blood work  Start oral tylenol as needed  Start Flomax  Please go to ER if any worsening symptom(s)   Take care,  Jarold Motto PA-C

## 2023-06-15 NOTE — Telephone Encounter (Signed)
 Chief Complaint: Blood in urine Symptoms: Beet red blood in urine, flank pain  Frequency: Constant onset last night Pertinent Negatives: Patient denies fever, chest pain  Disposition: [] ED /[] Urgent Care (no appt availability in office) / [x] Appointment(In office/virtual)/ []  [facility]/ [] Home Care/ [] Refused Recommended Disposition /[] [facility]/ []  Follow-up with PCP Additional Notes: Patient states he started urinating blood last night and has had 5-7 episodes since it started. Patient reported flank pain and abdominal pain as well. Care advice was given and appointment has been scheduled  today for evaluation.  Copied from CRM [id number]. Topic: Clinical - Red Word Triage >> Jun 15, 2023  9:25 AM Deaijah H wrote: Red Word that prompted transfer to Nurse Triage: Urinating blood all night and a bunch of pain Reason for Disposition  Pain or burning with passing urine  Answer Assessment - Initial Assessment Questions 1. COLOR of URINE: "Describe the color of the urine."  (e.g., tea-colored, pink, red, bloody) "Do you have blood clots in your urine?" (e.g., none, pea, grape, small coin)     Beet red 2. ONSET: "When did the bleeding start?"      Last night 3. EPISODES: "How many times has there been blood in the urine?" or "How many times today?"     5 times 4. PAIN with URINATION: "Is there any pain with passing your urine?" If Yes, ask: "How bad is the pain?"  (Scale 1-10; or mild, moderate, severe)    - MILD: Complains slightly about urination hurting.    - MODERATE: Interferes with normal activities.      - SEVERE: Excruciating, unwilling or unable to urinate because of the pain.      7/10 5. FEVER: "Do you have a fever?" If Yes, ask: "What is your temperature, how was it measured, and when did it start?"     No  6. ASSOCIATED SYMPTOMS: "Are you passing urine more frequently than usual?"     No  7. OTHER SYMPTOMS: "Do you have any other symptoms?" (e.g.,  back/flank pain, abdomen pain, vomiting)     Flank pain, abdominal pain  Protocols used: Urine - Blood In-A-AH

## 2023-06-16 ENCOUNTER — Ambulatory Visit: Payer: Self-pay | Admitting: *Deleted

## 2023-06-16 ENCOUNTER — Encounter: Payer: Self-pay | Admitting: Family Medicine

## 2023-06-16 LAB — CBC WITH DIFFERENTIAL/PLATELET
Basophils Absolute: 0.1 10*3/uL (ref 0.0–0.1)
Basophils Relative: 0.9 % (ref 0.0–3.0)
Eosinophils Absolute: 0.3 10*3/uL (ref 0.0–0.7)
Eosinophils Relative: 3.1 % (ref 0.0–5.0)
HCT: 45.5 % (ref 39.0–52.0)
Hemoglobin: 15.4 g/dL (ref 13.0–17.0)
Lymphocytes Relative: 25.8 % (ref 12.0–46.0)
Lymphs Abs: 2.5 10*3/uL (ref 0.7–4.0)
MCHC: 33.9 g/dL (ref 30.0–36.0)
MCV: 94.3 fl (ref 78.0–100.0)
Monocytes Absolute: 1.1 10*3/uL — ABNORMAL HIGH (ref 0.1–1.0)
Monocytes Relative: 11.3 % (ref 3.0–12.0)
Neutro Abs: 5.7 10*3/uL (ref 1.4–7.7)
Neutrophils Relative %: 58.9 % (ref 43.0–77.0)
Platelets: 261 10*3/uL (ref 150.0–400.0)
RBC: 4.82 Mil/uL (ref 4.22–5.81)
RDW: 12.6 % (ref 11.5–15.5)
WBC: 9.7 10*3/uL (ref 4.0–10.5)

## 2023-06-16 LAB — COMPREHENSIVE METABOLIC PANEL
ALT: 18 U/L (ref 0–53)
AST: 18 U/L (ref 0–37)
Albumin: 4.4 g/dL (ref 3.5–5.2)
Alkaline Phosphatase: 69 U/L (ref 39–117)
BUN: 10 mg/dL (ref 6–23)
CO2: 26 meq/L (ref 19–32)
Calcium: 9 mg/dL (ref 8.4–10.5)
Chloride: 105 meq/L (ref 96–112)
Creatinine, Ser: 0.88 mg/dL (ref 0.40–1.50)
GFR: 108.07 mL/min (ref >60.00)
Glucose, Bld: 94 mg/dL (ref 70–99)
Potassium: 3.6 meq/L (ref 3.5–5.1)
Sodium: 141 meq/L (ref 135–145)
Total Bilirubin: 0.3 mg/dL (ref 0.2–1.2)
Total Protein: 6.9 g/dL (ref 6.0–8.3)

## 2023-06-16 LAB — URINE CULTURE
MICRO NUMBER:: 16226585
Result:: NO GROWTH
SPECIMEN QUALITY:: ADEQUATE

## 2023-06-16 MED ORDER — DICLOFENAC SODIUM 75 MG PO TBEC: 75.0000 mg | DELAYED_RELEASE_TABLET | Freq: Two times a day (BID) | ORAL | 0 refills | Status: AC

## 2023-06-16 NOTE — Telephone Encounter (Signed)
 Chief Complaint: Patient requesting pain medication- reports he was treated and diagnosed yesterday with kidney stones- severe pain early am Symptoms: severe pain early am, patient reports urine is still cloudy - but less blood noted, reports normal urine stream- output Frequency: started 5am Pertinent Negatives: Patient denies decreased urine stream/output Disposition: [x] ED /[] Urgent Care (no appt availability in office) / [] Appointment(In office/virtual)/ []  [facility]/ [] Home Care/ [] Refused Recommended Disposition /[] [facility]/ []  Follow-up with PCP Additional Notes: Patient is requesting pain medication- he very agitated with pain- advised per OV note- ED if gets worse- patient refused to go. Patient states he has kidney stones and need something stronger than Tylenol/ibuprofen.  Patient request pain medication- [facility]  opied from CRM [id number]. Topic: Clinical - Red Word Triage >> Jun 16, 2023  8:08 AM Almira Coaster wrote: Red Word that prompted transfer to Nurse Triage: Patient is experiencing severe kidney pain, he was seen in the office yesterday and states that the pain today is just unbearable. Reason for Disposition . Prescription request for new medicine (not a refill)  Answer Assessment - Initial Assessment Questions 1. ONSET: "When did the pain begin?"      This morning at 5am 2. LOCATION: "Where does it hurt?" (upper, mid or lower back)     Pain in back and pelvis region 3. SEVERITY: "How bad is the pain?"  (e.g., Scale 1-10; mild, moderate, or severe)   - MILD (1-3): Doesn't interfere with normal activities.    - MODERATE (4-7): Interferes with normal activities or awakens from sleep.    - SEVERE (8-10): Excruciating pain, unable to do any normal activities.      severe 4. PATTERN: "Is the pain constant?" (e.g., yes, no; constant, intermittent)      constant  6. CAUSE:  "What do you think is causing the back pain?"      Not as much  blood in urine  8. MEDICINES: "What have you taken so far for the pain?" (e.g., nothing, acetaminophen, NSAIDS)     Patient requesting pain medication - no longer on pain management  10. OTHER SYMPTOMS: "Do you have any other symptoms?" (e.g., fever, abdomen pain, burning with urination, blood in urine)       Abdominal pain, patient is urinating normal  Answer Assessment - Initial Assessment Questions 1. LOCATION: "Where does it hurt?" (e.g., left, right)     Lower back and pelvis 2. ONSET: "When did the pain start?"     Early am 3. SEVERITY: "How bad is the pain?" (e.g., Scale 1-10; mild, moderate, or severe)   - MILD (1-3): doesn't interfere with normal activities    - MODERATE (4-7): interferes with normal activities or awakens from sleep    - SEVERE (8-10): excruciating pain and patient unable to do normal activities (stays in bed)       severe  Answer Assessment - Initial Assessment Questions 1. NAME of MEDICINE: "What medicine(s) are you calling about?"     Patient is requesting something for pain- recent diagnosis- kidney stones 2. QUESTION: "What is your question?" (e.g., double dose of medicine, side effect)     Patient states tylenol and ibuprofen are not touching pain 3. PRESCRIBER: "Who prescribed the medicine?" Reason: if prescribed by specialist, call should be referred to that group.     PCP 4. SYMPTOMS: "Do you have any symptoms?" If Yes, ask: "What symptoms are you having?"  "How bad are the symptoms (e.g., mild, moderate, severe)  Severe pain Patient reports urine still cloudy- not as much blood seen, normal flow of urine- but severe pain.Patient advised per note yesterday if he gets worse go to ED. He is not going to do that  Protocols used: Back Pain-A-AH, Flank Pain-A-AH, Medication Question Call-A-AH

## 2023-06-16 NOTE — Telephone Encounter (Signed)
 S[poke to CAL: Ryan [name]- notified refusal ED

## 2023-06-16 NOTE — Telephone Encounter (Signed)
 Pt has seen mychart and is communicating via mychart.

## 2023-06-16 NOTE — Telephone Encounter (Signed)
 Fyi.

## 2023-06-16 NOTE — Telephone Encounter (Signed)
 Please make sure he gets the MyChart message

## 2023-06-19 ENCOUNTER — Encounter: Payer: Self-pay | Admitting: Physician Assistant

## 2023-06-19 ENCOUNTER — Other Ambulatory Visit (HOSPITAL_COMMUNITY): Payer: Self-pay

## 2023-06-19 ENCOUNTER — Telehealth: Payer: Self-pay

## 2023-06-19 NOTE — Telephone Encounter (Signed)
 Pharmacy Patient Advocate Encounter   Received notification from CoverMyMeds that prior authorization for Diclofenac Sodium 75MG  dr tablets is required/requested.   Insurance verification completed.   The patient is insured through [facility] .   Per test claim:  PA is not needed pt said PER TEST CLAIM PHARMACY STATED THEY COULD PUT A DISCOUNT CARD AND IT WAS 13.99. SPOKE TO PT  AND HE SAID HE DID NOT NEED IT ANY LONGER HE PAST THE STONE.

## 2023-07-03 ENCOUNTER — Other Ambulatory Visit (HOSPITAL_COMMUNITY): Payer: Self-pay

## 2023-07-08 ENCOUNTER — Other Ambulatory Visit: Payer: Self-pay | Admitting: Physician Assistant

## 2023-07-13 ENCOUNTER — Other Ambulatory Visit (HOSPITAL_COMMUNITY): Payer: Self-pay

## 2023-07-13 NOTE — Telephone Encounter (Deleted)
 Pharmacy Patient Advocate Encounter   Received notification from CoverMyMeds that prior authorization for Diclofenac Sodium 75MG  dr tablets is required/requested.   Insurance verification completed.   The patient is insured through [facility] .   Per test claim: PA required; PA started via CoverMyMeds. KEY [id number] . Waiting for clinical questions to populate.

## 2023-07-14 NOTE — Telephone Encounter (Signed)
 A user error has taken place: orders placed in error, not carried out on this patientPA request has been Cancelled. New Encounter has been or will be created for follow up. For additional info see Pharmacy Prior Auth telephone encounter from 06/19/2023.Ryan [name]

## 2023-07-18 ENCOUNTER — Telehealth: Payer: Self-pay

## 2023-07-18 NOTE — Telephone Encounter (Signed)
 FYI. Do you want this to be an ov?  Copied from CRM [id number]. Topic: General - Other >> Jul 18, 2023  3:57 PM Ovid Blow wrote: Reason for CRM: patient stated that he wants Dr. Arlene Ben to contact him about rare disease

## 2023-07-18 NOTE — Telephone Encounter (Signed)
 Yes lets schedule follow up

## 2023-07-19 NOTE — Telephone Encounter (Signed)
 Please schedule follow up ov for pt to discuss.

## 2023-08-04 ENCOUNTER — Other Ambulatory Visit: Payer: Self-pay | Admitting: Family Medicine

## 2023-08-07 DIAGNOSIS — F411 Generalized anxiety disorder: Secondary | ICD-10-CM | POA: Diagnosis not present

## 2023-08-07 DIAGNOSIS — F410 Panic disorder [episodic paroxysmal anxiety] without agoraphobia: Secondary | ICD-10-CM | POA: Diagnosis not present

## 2023-09-06 DIAGNOSIS — F4389 Other reactions to severe stress: Secondary | ICD-10-CM | POA: Diagnosis not present

## 2023-09-06 DIAGNOSIS — F410 Panic disorder [episodic paroxysmal anxiety] without agoraphobia: Secondary | ICD-10-CM | POA: Diagnosis not present

## 2023-09-06 DIAGNOSIS — F411 Generalized anxiety disorder: Secondary | ICD-10-CM | POA: Diagnosis not present

## 2023-10-30 ENCOUNTER — Other Ambulatory Visit: Payer: Self-pay | Admitting: Family Medicine

## 2023-12-07 DIAGNOSIS — F410 Panic disorder [episodic paroxysmal anxiety] without agoraphobia: Secondary | ICD-10-CM | POA: Diagnosis not present

## 2023-12-07 DIAGNOSIS — F4389 Other reactions to severe stress: Secondary | ICD-10-CM | POA: Diagnosis not present

## 2023-12-07 DIAGNOSIS — F411 Generalized anxiety disorder: Secondary | ICD-10-CM | POA: Diagnosis not present

## 2024-04-09 ENCOUNTER — Other Ambulatory Visit: Payer: Self-pay | Admitting: Family Medicine

## 2024-05-03 ENCOUNTER — Ambulatory Visit: Payer: Self-pay
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
## 2024-05-06 ENCOUNTER — Ambulatory Visit: Admitting: Family Medicine
